# Patient Record
Sex: Female | Born: 1935 | Race: Black or African American | Hispanic: No | Smoking: Never smoker
Health system: Southern US, Community
[De-identification: ages and names within clinical notes are randomized; demographics above are authoritative.]

## PROBLEM LIST (undated history)

## (undated) DIAGNOSIS — I10 Essential (primary) hypertension: Secondary | ICD-10-CM

## (undated) DIAGNOSIS — I509 Heart failure, unspecified: Secondary | ICD-10-CM

## (undated) DIAGNOSIS — E785 Hyperlipidemia, unspecified: Secondary | ICD-10-CM

## (undated) HISTORY — PX: NO PAST SURGERIES: SHX2092

---

## 2001-07-25 ENCOUNTER — Other Ambulatory Visit: Admission: RE | Admit: 2001-07-25 | Discharge: 2001-07-25 | Payer: Self-pay | Admitting: Family Medicine

## 2001-08-07 ENCOUNTER — Ambulatory Visit (HOSPITAL_COMMUNITY): Admission: RE | Admit: 2001-08-07 | Discharge: 2001-08-07 | Payer: Self-pay | Admitting: General Surgery

## 2002-06-23 ENCOUNTER — Encounter: Payer: Self-pay | Admitting: Family Medicine

## 2002-06-23 ENCOUNTER — Ambulatory Visit (HOSPITAL_COMMUNITY): Admission: RE | Admit: 2002-06-23 | Discharge: 2002-06-23 | Payer: Self-pay | Admitting: Family Medicine

## 2003-06-26 ENCOUNTER — Encounter: Payer: Self-pay | Admitting: Family Medicine

## 2003-06-26 ENCOUNTER — Ambulatory Visit (HOSPITAL_COMMUNITY): Admission: RE | Admit: 2003-06-26 | Discharge: 2003-06-26 | Payer: Self-pay | Admitting: Family Medicine

## 2004-08-11 ENCOUNTER — Ambulatory Visit (HOSPITAL_COMMUNITY): Admission: RE | Admit: 2004-08-11 | Discharge: 2004-08-11 | Payer: Self-pay | Admitting: Family Medicine

## 2005-09-18 ENCOUNTER — Ambulatory Visit (HOSPITAL_COMMUNITY): Admission: RE | Admit: 2005-09-18 | Discharge: 2005-09-18 | Payer: Self-pay | Admitting: Family Medicine

## 2005-11-13 ENCOUNTER — Ambulatory Visit: Payer: Self-pay | Admitting: Family Medicine

## 2005-11-14 ENCOUNTER — Ambulatory Visit (HOSPITAL_COMMUNITY): Admission: RE | Admit: 2005-11-14 | Discharge: 2005-11-14 | Payer: Self-pay | Admitting: Family Medicine

## 2005-11-14 ENCOUNTER — Ambulatory Visit: Payer: Self-pay | Admitting: *Deleted

## 2005-12-12 ENCOUNTER — Ambulatory Visit: Payer: Self-pay | Admitting: Family Medicine

## 2006-01-05 ENCOUNTER — Encounter (INDEPENDENT_AMBULATORY_CARE_PROVIDER_SITE_OTHER): Payer: Self-pay | Admitting: Family Medicine

## 2006-01-05 LAB — CONVERTED CEMR LAB: Hgb A1c MFr Bld: 7.6 %

## 2006-03-13 ENCOUNTER — Ambulatory Visit: Payer: Self-pay | Admitting: Family Medicine

## 2006-03-20 ENCOUNTER — Ambulatory Visit (HOSPITAL_COMMUNITY): Admission: RE | Admit: 2006-03-20 | Discharge: 2006-03-20 | Payer: Self-pay | Admitting: Family Medicine

## 2006-03-29 ENCOUNTER — Ambulatory Visit: Payer: Self-pay | Admitting: Family Medicine

## 2006-04-26 ENCOUNTER — Ambulatory Visit: Payer: Self-pay | Admitting: Family Medicine

## 2006-05-10 ENCOUNTER — Ambulatory Visit: Payer: Self-pay | Admitting: Family Medicine

## 2006-06-12 ENCOUNTER — Ambulatory Visit: Payer: Self-pay | Admitting: Family Medicine

## 2006-07-03 ENCOUNTER — Ambulatory Visit: Payer: Self-pay | Admitting: Family Medicine

## 2006-07-03 LAB — CONVERTED CEMR LAB: Hgb A1c MFr Bld: 7.3 %

## 2006-07-05 ENCOUNTER — Encounter (INDEPENDENT_AMBULATORY_CARE_PROVIDER_SITE_OTHER): Payer: Self-pay | Admitting: Family Medicine

## 2006-07-05 LAB — CONVERTED CEMR LAB: Blood Glucose, Fasting: 110 mg/dL

## 2006-07-24 ENCOUNTER — Ambulatory Visit: Payer: Self-pay | Admitting: Family Medicine

## 2006-08-21 ENCOUNTER — Ambulatory Visit: Payer: Self-pay | Admitting: Family Medicine

## 2006-08-28 ENCOUNTER — Ambulatory Visit: Payer: Self-pay | Admitting: Family Medicine

## 2006-09-18 ENCOUNTER — Ambulatory Visit: Payer: Self-pay | Admitting: Family Medicine

## 2006-10-19 ENCOUNTER — Ambulatory Visit (HOSPITAL_COMMUNITY): Admission: RE | Admit: 2006-10-19 | Discharge: 2006-10-19 | Payer: Self-pay | Admitting: Family Medicine

## 2006-10-25 LAB — CONVERTED CEMR LAB: Pap Smear: NORMAL

## 2006-10-31 ENCOUNTER — Encounter (INDEPENDENT_AMBULATORY_CARE_PROVIDER_SITE_OTHER): Payer: Self-pay | Admitting: Family Medicine

## 2006-11-01 ENCOUNTER — Other Ambulatory Visit: Admission: RE | Admit: 2006-11-01 | Discharge: 2006-11-01 | Payer: Self-pay | Admitting: Family Medicine

## 2006-11-01 ENCOUNTER — Ambulatory Visit: Payer: Self-pay | Admitting: Family Medicine

## 2006-11-13 ENCOUNTER — Ambulatory Visit (HOSPITAL_COMMUNITY): Admission: RE | Admit: 2006-11-13 | Discharge: 2006-11-13 | Payer: Self-pay | Admitting: Family Medicine

## 2006-11-13 ENCOUNTER — Encounter (INDEPENDENT_AMBULATORY_CARE_PROVIDER_SITE_OTHER): Payer: Self-pay | Admitting: Family Medicine

## 2006-11-20 ENCOUNTER — Encounter: Payer: Self-pay | Admitting: Family Medicine

## 2006-11-20 DIAGNOSIS — B351 Tinea unguium: Secondary | ICD-10-CM

## 2006-11-20 DIAGNOSIS — E669 Obesity, unspecified: Secondary | ICD-10-CM

## 2006-11-20 DIAGNOSIS — I6529 Occlusion and stenosis of unspecified carotid artery: Secondary | ICD-10-CM

## 2006-11-20 DIAGNOSIS — K5900 Constipation, unspecified: Secondary | ICD-10-CM | POA: Insufficient documentation

## 2006-11-20 DIAGNOSIS — H269 Unspecified cataract: Secondary | ICD-10-CM

## 2006-11-20 DIAGNOSIS — I10 Essential (primary) hypertension: Secondary | ICD-10-CM | POA: Insufficient documentation

## 2006-11-20 DIAGNOSIS — E785 Hyperlipidemia, unspecified: Secondary | ICD-10-CM

## 2006-12-07 ENCOUNTER — Ambulatory Visit: Payer: Self-pay | Admitting: Family Medicine

## 2007-01-04 ENCOUNTER — Encounter (INDEPENDENT_AMBULATORY_CARE_PROVIDER_SITE_OTHER): Payer: Self-pay | Admitting: Family Medicine

## 2007-01-04 LAB — CONVERTED CEMR LAB
BUN: 17 mg/dL (ref 6–23)
Basophils Relative: 0 % (ref 0–1)
CO2: 28 meq/L (ref 19–32)
Calcium: 9.8 mg/dL (ref 8.4–10.5)
Creatinine, Ser: 0.99 mg/dL (ref 0.40–1.20)
Glucose, Bld: 138 mg/dL — ABNORMAL HIGH (ref 70–99)
LDL Cholesterol: 103 mg/dL — ABNORMAL HIGH (ref 0–99)
Monocytes Relative: 9 % (ref 3–11)
Neutro Abs: 3.7 10*3/uL (ref 1.7–7.7)
Total CHOL/HDL Ratio: 2.6
WBC: 5.2 10*3/uL (ref 4.0–10.5)

## 2007-01-18 ENCOUNTER — Ambulatory Visit: Payer: Self-pay | Admitting: Family Medicine

## 2007-01-18 LAB — CONVERTED CEMR LAB
Basophils Absolute: 0 10*3/uL (ref 0.0–0.1)
Eosinophils Absolute: 0.3 10*3/uL (ref 0.0–0.7)
Ferritin: 140 ng/mL (ref 10–291)
HCT: 33.3 % — ABNORMAL LOW (ref 36.0–46.0)
Iron: 38 ug/dL — ABNORMAL LOW (ref 42–145)
Lymphs Abs: 1.1 10*3/uL (ref 0.7–3.3)
Monocytes Relative: 7 % (ref 3–11)
Neutrophils Relative %: 60 % (ref 43–77)
RBC: 4.07 M/uL (ref 3.87–5.11)
RDW: 15.8 % — ABNORMAL HIGH (ref 11.5–14.0)
Retic Count, Absolute: 48.8 (ref 19.0–186.0)
Retic Ct Pct: 1.2 % (ref 0.4–3.1)
Saturation Ratios: 14 % — ABNORMAL LOW (ref 20–55)
TIBC: 267 ug/dL (ref 250–470)
UIBC: 229 ug/dL
WBC: 4.2 10*3/uL (ref 4.0–10.5)

## 2007-02-13 ENCOUNTER — Telehealth (INDEPENDENT_AMBULATORY_CARE_PROVIDER_SITE_OTHER): Payer: Self-pay | Admitting: *Deleted

## 2007-02-26 ENCOUNTER — Ambulatory Visit: Payer: Self-pay | Admitting: Family Medicine

## 2007-02-26 DIAGNOSIS — R9389 Abnormal findings on diagnostic imaging of other specified body structures: Secondary | ICD-10-CM | POA: Insufficient documentation

## 2007-02-27 ENCOUNTER — Ambulatory Visit: Payer: Self-pay | Admitting: Cardiology

## 2007-02-27 ENCOUNTER — Ambulatory Visit (HOSPITAL_COMMUNITY): Admission: RE | Admit: 2007-02-27 | Discharge: 2007-02-27 | Payer: Self-pay | Admitting: Family Medicine

## 2007-02-27 ENCOUNTER — Encounter (INDEPENDENT_AMBULATORY_CARE_PROVIDER_SITE_OTHER): Payer: Self-pay | Admitting: Family Medicine

## 2007-02-27 LAB — CONVERTED CEMR LAB
AST: 16 units/L (ref 0–37)
Albumin: 3.6 g/dL (ref 3.5–5.2)
BUN: 16 mg/dL (ref 6–23)
CO2: 26 meq/L (ref 19–32)
Calcium: 9.1 mg/dL (ref 8.4–10.5)
Cholesterol: 127 mg/dL (ref 0–200)
Eosinophils Absolute: 0.2 10*3/uL (ref 0.0–0.7)
Eosinophils Relative: 5 % (ref 0–5)
Glucose, Bld: 116 mg/dL — ABNORMAL HIGH (ref 70–99)
Hemoglobin: 10 g/dL — ABNORMAL LOW (ref 12.0–15.0)
LDL Cholesterol: 55 mg/dL (ref 0–99)
Lymphs Abs: 1.3 10*3/uL (ref 0.7–3.3)
Monocytes Relative: 8 % (ref 3–11)
Neutro Abs: 2.4 10*3/uL (ref 1.7–7.7)
Neutrophils Relative %: 56 % (ref 43–77)
RBC: 3.88 M/uL (ref 3.87–5.11)
Sodium: 142 meq/L (ref 135–145)
Total CHOL/HDL Ratio: 2.2
Triglycerides: 74 mg/dL (ref <150)
WBC: 4.3 10*3/uL (ref 4.0–10.5)

## 2007-02-28 ENCOUNTER — Encounter (INDEPENDENT_AMBULATORY_CARE_PROVIDER_SITE_OTHER): Payer: Self-pay | Admitting: Family Medicine

## 2007-03-05 ENCOUNTER — Encounter (INDEPENDENT_AMBULATORY_CARE_PROVIDER_SITE_OTHER): Payer: Self-pay | Admitting: Family Medicine

## 2007-03-07 ENCOUNTER — Encounter (INDEPENDENT_AMBULATORY_CARE_PROVIDER_SITE_OTHER): Payer: Self-pay | Admitting: Family Medicine

## 2007-03-11 ENCOUNTER — Encounter (INDEPENDENT_AMBULATORY_CARE_PROVIDER_SITE_OTHER): Payer: Self-pay | Admitting: Family Medicine

## 2007-03-13 ENCOUNTER — Encounter (INDEPENDENT_AMBULATORY_CARE_PROVIDER_SITE_OTHER): Payer: Self-pay | Admitting: Family Medicine

## 2007-03-18 ENCOUNTER — Encounter (INDEPENDENT_AMBULATORY_CARE_PROVIDER_SITE_OTHER): Payer: Self-pay | Admitting: Family Medicine

## 2007-03-21 ENCOUNTER — Telehealth (INDEPENDENT_AMBULATORY_CARE_PROVIDER_SITE_OTHER): Payer: Self-pay | Admitting: Family Medicine

## 2007-04-10 ENCOUNTER — Telehealth (INDEPENDENT_AMBULATORY_CARE_PROVIDER_SITE_OTHER): Payer: Self-pay | Admitting: Family Medicine

## 2007-04-16 ENCOUNTER — Encounter (INDEPENDENT_AMBULATORY_CARE_PROVIDER_SITE_OTHER): Payer: Self-pay | Admitting: Family Medicine

## 2007-04-19 ENCOUNTER — Encounter (INDEPENDENT_AMBULATORY_CARE_PROVIDER_SITE_OTHER): Payer: Self-pay | Admitting: Family Medicine

## 2007-04-23 ENCOUNTER — Ambulatory Visit: Payer: Self-pay | Admitting: Family Medicine

## 2007-04-23 LAB — CONVERTED CEMR LAB
Cholesterol, target level: 200 mg/dL
HDL goal, serum: 40 mg/dL
LDL Goal: 100 mg/dL

## 2007-04-30 ENCOUNTER — Encounter (INDEPENDENT_AMBULATORY_CARE_PROVIDER_SITE_OTHER): Payer: Self-pay | Admitting: Family Medicine

## 2007-05-07 ENCOUNTER — Encounter (INDEPENDENT_AMBULATORY_CARE_PROVIDER_SITE_OTHER): Payer: Self-pay | Admitting: Family Medicine

## 2007-05-21 ENCOUNTER — Ambulatory Visit: Payer: Self-pay | Admitting: Family Medicine

## 2007-05-31 ENCOUNTER — Encounter (INDEPENDENT_AMBULATORY_CARE_PROVIDER_SITE_OTHER): Payer: Self-pay | Admitting: Family Medicine

## 2007-06-04 ENCOUNTER — Encounter (INDEPENDENT_AMBULATORY_CARE_PROVIDER_SITE_OTHER): Payer: Self-pay | Admitting: Family Medicine

## 2007-06-18 ENCOUNTER — Ambulatory Visit: Payer: Self-pay | Admitting: Family Medicine

## 2007-06-18 LAB — CONVERTED CEMR LAB: Glucose, Bld: 114 mg/dL

## 2007-07-10 ENCOUNTER — Encounter (INDEPENDENT_AMBULATORY_CARE_PROVIDER_SITE_OTHER): Payer: Self-pay | Admitting: Family Medicine

## 2007-08-05 ENCOUNTER — Ambulatory Visit: Payer: Self-pay | Admitting: Gastroenterology

## 2007-08-06 ENCOUNTER — Encounter (INDEPENDENT_AMBULATORY_CARE_PROVIDER_SITE_OTHER): Payer: Self-pay | Admitting: Family Medicine

## 2007-08-06 ENCOUNTER — Ambulatory Visit (HOSPITAL_COMMUNITY): Admission: RE | Admit: 2007-08-06 | Discharge: 2007-08-06 | Payer: Self-pay | Admitting: Gastroenterology

## 2007-08-06 ENCOUNTER — Encounter: Payer: Self-pay | Admitting: Gastroenterology

## 2007-08-07 ENCOUNTER — Encounter (INDEPENDENT_AMBULATORY_CARE_PROVIDER_SITE_OTHER): Payer: Self-pay | Admitting: Family Medicine

## 2007-08-12 ENCOUNTER — Ambulatory Visit: Payer: Self-pay | Admitting: Family Medicine

## 2007-08-12 DIAGNOSIS — D126 Benign neoplasm of colon, unspecified: Secondary | ICD-10-CM

## 2007-09-16 ENCOUNTER — Ambulatory Visit: Payer: Self-pay | Admitting: Family Medicine

## 2007-09-16 LAB — CONVERTED CEMR LAB: Hgb A1c MFr Bld: 7.8 %

## 2007-09-30 ENCOUNTER — Encounter (INDEPENDENT_AMBULATORY_CARE_PROVIDER_SITE_OTHER): Payer: Self-pay | Admitting: Family Medicine

## 2007-11-27 ENCOUNTER — Encounter (INDEPENDENT_AMBULATORY_CARE_PROVIDER_SITE_OTHER): Payer: Self-pay | Admitting: Family Medicine

## 2007-12-05 ENCOUNTER — Ambulatory Visit (HOSPITAL_COMMUNITY): Admission: RE | Admit: 2007-12-05 | Discharge: 2007-12-05 | Payer: Self-pay | Admitting: Family Medicine

## 2007-12-06 ENCOUNTER — Encounter (INDEPENDENT_AMBULATORY_CARE_PROVIDER_SITE_OTHER): Payer: Self-pay | Admitting: Family Medicine

## 2007-12-06 ENCOUNTER — Telehealth (INDEPENDENT_AMBULATORY_CARE_PROVIDER_SITE_OTHER): Payer: Self-pay | Admitting: *Deleted

## 2007-12-27 ENCOUNTER — Encounter (INDEPENDENT_AMBULATORY_CARE_PROVIDER_SITE_OTHER): Payer: Self-pay | Admitting: Family Medicine

## 2008-01-24 ENCOUNTER — Ambulatory Visit: Payer: Self-pay | Admitting: Family Medicine

## 2008-01-24 LAB — CONVERTED CEMR LAB: Glucose, Bld: 132 mg/dL

## 2008-01-25 ENCOUNTER — Encounter (INDEPENDENT_AMBULATORY_CARE_PROVIDER_SITE_OTHER): Payer: Self-pay | Admitting: Family Medicine

## 2008-01-25 LAB — CONVERTED CEMR LAB
ALT: 13 units/L (ref 0–35)
AST: 20 units/L (ref 0–37)
Basophils Relative: 1 % (ref 0–1)
Calcium: 10.2 mg/dL (ref 8.4–10.5)
Creatinine, Ser: 1.35 mg/dL — ABNORMAL HIGH (ref 0.40–1.20)
Eosinophils Absolute: 0.2 10*3/uL (ref 0.0–0.7)
Glucose, Bld: 116 mg/dL — ABNORMAL HIGH (ref 70–99)
MCHC: 32.3 g/dL (ref 30.0–36.0)
MCV: 81.6 fL (ref 78.0–100.0)
Neutro Abs: 2.2 10*3/uL (ref 1.7–7.7)
Platelets: 141 10*3/uL — ABNORMAL LOW (ref 150–400)
RBC: 4.18 M/uL (ref 3.87–5.11)
Total Bilirubin: 0.4 mg/dL (ref 0.3–1.2)
WBC: 4.2 10*3/uL (ref 4.0–10.5)

## 2008-01-27 ENCOUNTER — Telehealth (INDEPENDENT_AMBULATORY_CARE_PROVIDER_SITE_OTHER): Payer: Self-pay | Admitting: *Deleted

## 2008-01-29 ENCOUNTER — Encounter (INDEPENDENT_AMBULATORY_CARE_PROVIDER_SITE_OTHER): Payer: Self-pay | Admitting: Family Medicine

## 2008-01-30 ENCOUNTER — Encounter (INDEPENDENT_AMBULATORY_CARE_PROVIDER_SITE_OTHER): Payer: Self-pay | Admitting: Family Medicine

## 2008-02-25 ENCOUNTER — Encounter (INDEPENDENT_AMBULATORY_CARE_PROVIDER_SITE_OTHER): Payer: Self-pay | Admitting: Family Medicine

## 2008-04-22 ENCOUNTER — Ambulatory Visit: Payer: Self-pay | Admitting: Family Medicine

## 2008-04-22 ENCOUNTER — Telehealth (INDEPENDENT_AMBULATORY_CARE_PROVIDER_SITE_OTHER): Payer: Self-pay | Admitting: *Deleted

## 2008-04-22 LAB — CONVERTED CEMR LAB
Glucose, Urine, Semiquant: NEGATIVE
Nitrite: NEGATIVE
Protein, U semiquant: NEGATIVE
Specific Gravity, Urine: 1.01
pH: 5

## 2008-04-24 ENCOUNTER — Encounter (INDEPENDENT_AMBULATORY_CARE_PROVIDER_SITE_OTHER): Payer: Self-pay | Admitting: Family Medicine

## 2008-04-27 ENCOUNTER — Encounter (INDEPENDENT_AMBULATORY_CARE_PROVIDER_SITE_OTHER): Payer: Self-pay | Admitting: Family Medicine

## 2008-04-27 ENCOUNTER — Telehealth (INDEPENDENT_AMBULATORY_CARE_PROVIDER_SITE_OTHER): Payer: Self-pay | Admitting: *Deleted

## 2008-04-27 LAB — CONVERTED CEMR LAB
ALT: 11 units/L (ref 0–35)
Albumin: 4.2 g/dL (ref 3.5–5.2)
Alkaline Phosphatase: 44 units/L (ref 39–117)
Basophils Absolute: 0 10*3/uL (ref 0.0–0.1)
Basophils Relative: 1 % (ref 0–1)
CO2: 24 meq/L (ref 19–32)
Chloride: 104 meq/L (ref 96–112)
Cholesterol: 180 mg/dL (ref 0–200)
Creatinine, Urine: 63.8 mg/dL
HDL: 71 mg/dL (ref >39)
LDL Cholesterol: 94 mg/dL (ref 0–99)
Lymphs Abs: 1.2 10*3/uL (ref 0.7–4.0)
MCHC: 32.8 g/dL (ref 30.0–36.0)
MCV: 81.8 fL (ref 78.0–100.0)
Microalb Creat Ratio: 13.3 mg/g (ref 0.0–30.0)
Microalb, Ur: 0.85 mg/dL (ref 0.00–1.89)
Neutro Abs: 1.6 10*3/uL — ABNORMAL LOW (ref 1.7–7.7)
RBC: 3.73 M/uL — ABNORMAL LOW (ref 3.87–5.11)
RDW: 16 % — ABNORMAL HIGH (ref 11.5–15.5)
Sodium: 141 meq/L (ref 135–145)
Total Bilirubin: 0.4 mg/dL (ref 0.3–1.2)
WBC: 3.3 10*3/uL — ABNORMAL LOW (ref 4.0–10.5)

## 2008-04-28 ENCOUNTER — Ambulatory Visit (HOSPITAL_COMMUNITY): Admission: RE | Admit: 2008-04-28 | Discharge: 2008-04-28 | Payer: Self-pay | Admitting: Family Medicine

## 2008-04-30 ENCOUNTER — Encounter (HOSPITAL_COMMUNITY): Admission: RE | Admit: 2008-04-30 | Discharge: 2008-05-30 | Payer: Self-pay | Admitting: Family Medicine

## 2008-04-30 ENCOUNTER — Telehealth (INDEPENDENT_AMBULATORY_CARE_PROVIDER_SITE_OTHER): Payer: Self-pay | Admitting: *Deleted

## 2008-04-30 ENCOUNTER — Encounter (INDEPENDENT_AMBULATORY_CARE_PROVIDER_SITE_OTHER): Payer: Self-pay | Admitting: Family Medicine

## 2008-05-12 ENCOUNTER — Encounter (INDEPENDENT_AMBULATORY_CARE_PROVIDER_SITE_OTHER): Payer: Self-pay | Admitting: Family Medicine

## 2008-06-02 ENCOUNTER — Encounter (INDEPENDENT_AMBULATORY_CARE_PROVIDER_SITE_OTHER): Payer: Self-pay | Admitting: Family Medicine

## 2008-06-02 ENCOUNTER — Encounter (HOSPITAL_COMMUNITY): Admission: RE | Admit: 2008-06-02 | Discharge: 2008-07-02 | Payer: Self-pay | Admitting: Oncology

## 2008-06-02 ENCOUNTER — Ambulatory Visit (HOSPITAL_COMMUNITY): Payer: Self-pay | Admitting: Oncology

## 2008-06-03 ENCOUNTER — Ambulatory Visit: Payer: Self-pay | Admitting: Family Medicine

## 2008-06-03 DIAGNOSIS — D61818 Other pancytopenia: Secondary | ICD-10-CM | POA: Insufficient documentation

## 2008-06-15 ENCOUNTER — Encounter (INDEPENDENT_AMBULATORY_CARE_PROVIDER_SITE_OTHER): Payer: Self-pay | Admitting: Family Medicine

## 2008-07-15 ENCOUNTER — Ambulatory Visit: Payer: Self-pay | Admitting: Family Medicine

## 2008-07-20 ENCOUNTER — Ambulatory Visit (HOSPITAL_COMMUNITY): Payer: Self-pay | Admitting: Oncology

## 2008-07-20 ENCOUNTER — Encounter (HOSPITAL_COMMUNITY): Admission: RE | Admit: 2008-07-20 | Discharge: 2008-08-19 | Payer: Self-pay | Admitting: Oncology

## 2008-09-21 ENCOUNTER — Encounter (HOSPITAL_COMMUNITY): Admission: RE | Admit: 2008-09-21 | Discharge: 2008-10-21 | Payer: Self-pay | Admitting: Oncology

## 2008-09-21 ENCOUNTER — Ambulatory Visit (HOSPITAL_COMMUNITY): Payer: Self-pay | Admitting: Oncology

## 2008-10-15 ENCOUNTER — Ambulatory Visit: Payer: Self-pay | Admitting: Family Medicine

## 2008-10-15 LAB — CONVERTED CEMR LAB: Hgb A1c MFr Bld: 7 %

## 2008-10-17 ENCOUNTER — Encounter (INDEPENDENT_AMBULATORY_CARE_PROVIDER_SITE_OTHER): Payer: Self-pay | Admitting: Family Medicine

## 2008-10-19 LAB — CONVERTED CEMR LAB
ALT: 11 units/L (ref 0–35)
AST: 17 units/L (ref 0–37)
Alkaline Phosphatase: 45 units/L (ref 39–117)
BUN: 39 mg/dL — ABNORMAL HIGH (ref 6–23)
CO2: 20 meq/L (ref 19–32)
Creatinine, Ser: 1.53 mg/dL — ABNORMAL HIGH (ref 0.40–1.20)
Glucose, Bld: 164 mg/dL — ABNORMAL HIGH (ref 70–99)
Potassium: 3.9 meq/L (ref 3.5–5.3)
Total Protein: 7 g/dL (ref 6.0–8.3)

## 2008-12-15 ENCOUNTER — Ambulatory Visit (HOSPITAL_COMMUNITY): Payer: Self-pay | Admitting: Oncology

## 2008-12-15 ENCOUNTER — Encounter (HOSPITAL_COMMUNITY): Admission: RE | Admit: 2008-12-15 | Discharge: 2009-01-14 | Payer: Self-pay | Admitting: Oncology

## 2009-01-15 ENCOUNTER — Ambulatory Visit: Payer: Self-pay | Admitting: Family Medicine

## 2009-01-15 DIAGNOSIS — E1165 Type 2 diabetes mellitus with hyperglycemia: Secondary | ICD-10-CM

## 2009-01-15 LAB — CONVERTED CEMR LAB: Glucose, Bld: 212 mg/dL

## 2009-01-16 ENCOUNTER — Encounter (INDEPENDENT_AMBULATORY_CARE_PROVIDER_SITE_OTHER): Payer: Self-pay | Admitting: Family Medicine

## 2009-01-19 LAB — CONVERTED CEMR LAB
ALT: 21 units/L (ref 0–35)
AST: 21 units/L (ref 0–37)
Albumin: 3.5 g/dL (ref 3.5–5.2)
Alkaline Phosphatase: 47 units/L (ref 39–117)
BUN: 25 mg/dL — ABNORMAL HIGH (ref 6–23)
Basophils Absolute: 0 10*3/uL (ref 0.0–0.1)
Basophils Relative: 1 % (ref 0–1)
Chloride: 108 meq/L (ref 96–112)
Eosinophils Absolute: 0.2 10*3/uL (ref 0.0–0.7)
MCHC: 31.5 g/dL (ref 30.0–36.0)
MCV: 81.6 fL (ref 78.0–100.0)
Monocytes Relative: 7 % (ref 3–12)
Neutro Abs: 2.6 10*3/uL (ref 1.7–7.7)
Neutrophils Relative %: 65 % (ref 43–77)
Platelets: 150 10*3/uL (ref 150–400)
Potassium: 3.9 meq/L (ref 3.5–5.3)
RDW: 16.6 % — ABNORMAL HIGH (ref 11.5–15.5)
Sodium: 142 meq/L (ref 135–145)

## 2009-01-27 ENCOUNTER — Encounter (HOSPITAL_COMMUNITY): Admission: RE | Admit: 2009-01-27 | Discharge: 2009-02-26 | Payer: Self-pay | Admitting: Oncology

## 2009-02-15 ENCOUNTER — Ambulatory Visit (HOSPITAL_COMMUNITY): Payer: Self-pay | Admitting: Oncology

## 2009-02-26 ENCOUNTER — Ambulatory Visit: Payer: Self-pay | Admitting: Family Medicine

## 2009-04-09 ENCOUNTER — Ambulatory Visit: Payer: Self-pay | Admitting: Family Medicine

## 2009-04-09 LAB — CONVERTED CEMR LAB

## 2009-05-21 ENCOUNTER — Other Ambulatory Visit: Admission: RE | Admit: 2009-05-21 | Discharge: 2009-05-21 | Payer: Self-pay | Admitting: Family Medicine

## 2009-05-21 ENCOUNTER — Encounter (INDEPENDENT_AMBULATORY_CARE_PROVIDER_SITE_OTHER): Payer: Self-pay | Admitting: Family Medicine

## 2009-05-21 ENCOUNTER — Ambulatory Visit: Payer: Self-pay | Admitting: Family Medicine

## 2009-05-21 DIAGNOSIS — M949 Disorder of cartilage, unspecified: Secondary | ICD-10-CM

## 2009-05-21 DIAGNOSIS — M899 Disorder of bone, unspecified: Secondary | ICD-10-CM | POA: Insufficient documentation

## 2009-05-25 LAB — CONVERTED CEMR LAB
Albumin: 4.2 g/dL (ref 3.5–5.2)
BUN: 30 mg/dL — ABNORMAL HIGH (ref 6–23)
Basophils Absolute: 0 10*3/uL (ref 0.0–0.1)
Calcium: 9.5 mg/dL (ref 8.4–10.5)
Chloride: 105 meq/L (ref 96–112)
Creatinine, Ser: 1.25 mg/dL — ABNORMAL HIGH (ref 0.40–1.20)
Eosinophils Absolute: 0.3 10*3/uL (ref 0.0–0.7)
Eosinophils Relative: 7 % — ABNORMAL HIGH (ref 0–5)
Glucose, Bld: 118 mg/dL — ABNORMAL HIGH (ref 70–99)
HCT: 32.8 % — ABNORMAL LOW (ref 36.0–46.0)
HDL: 78 mg/dL (ref >39)
Hemoglobin: 10.9 g/dL — ABNORMAL LOW (ref 12.0–15.0)
Lymphocytes Relative: 30 % (ref 12–46)
Lymphs Abs: 1.1 10*3/uL (ref 0.7–4.0)
MCV: 79.6 fL (ref 78.0–100.0)
Monocytes Absolute: 0.4 10*3/uL (ref 0.1–1.0)
Potassium: 3.8 meq/L (ref 3.5–5.3)
RDW: 16.8 % — ABNORMAL HIGH (ref 11.5–15.5)
Triglycerides: 60 mg/dL (ref <150)

## 2009-06-11 ENCOUNTER — Ambulatory Visit (HOSPITAL_COMMUNITY): Admission: RE | Admit: 2009-06-11 | Discharge: 2009-06-11 | Payer: Self-pay | Admitting: Family Medicine

## 2009-06-11 ENCOUNTER — Encounter (INDEPENDENT_AMBULATORY_CARE_PROVIDER_SITE_OTHER): Payer: Self-pay | Admitting: Family Medicine

## 2009-06-25 ENCOUNTER — Ambulatory Visit: Payer: Self-pay | Admitting: Family Medicine

## 2009-06-26 ENCOUNTER — Encounter (INDEPENDENT_AMBULATORY_CARE_PROVIDER_SITE_OTHER): Payer: Self-pay | Admitting: Family Medicine

## 2009-06-29 LAB — CONVERTED CEMR LAB: Microalb Creat Ratio: 15.3 mg/g (ref 0.0–30.0)

## 2009-07-02 ENCOUNTER — Encounter: Admission: RE | Admit: 2009-07-02 | Discharge: 2009-07-02 | Payer: Self-pay | Admitting: Family Medicine

## 2009-08-06 ENCOUNTER — Ambulatory Visit: Payer: Self-pay | Admitting: Family Medicine

## 2009-08-06 DIAGNOSIS — E041 Nontoxic single thyroid nodule: Secondary | ICD-10-CM

## 2009-08-06 LAB — CONVERTED CEMR LAB
Glucose, Bld: 121 mg/dL
Hgb A1c MFr Bld: 7.4 %

## 2009-08-17 ENCOUNTER — Ambulatory Visit (HOSPITAL_COMMUNITY): Admission: RE | Admit: 2009-08-17 | Discharge: 2009-08-17 | Payer: Self-pay | Admitting: Family Medicine

## 2009-08-31 ENCOUNTER — Encounter (INDEPENDENT_AMBULATORY_CARE_PROVIDER_SITE_OTHER): Payer: Self-pay | Admitting: Family Medicine

## 2009-10-03 ENCOUNTER — Inpatient Hospital Stay (HOSPITAL_COMMUNITY): Admission: EM | Admit: 2009-10-03 | Discharge: 2009-10-05 | Payer: Self-pay | Admitting: Emergency Medicine

## 2009-10-03 ENCOUNTER — Emergency Department (HOSPITAL_COMMUNITY): Admission: EM | Admit: 2009-10-03 | Discharge: 2009-10-03 | Payer: Self-pay | Admitting: Emergency Medicine

## 2010-05-05 ENCOUNTER — Emergency Department (HOSPITAL_COMMUNITY): Admission: EM | Admit: 2010-05-05 | Discharge: 2010-05-05 | Payer: Self-pay | Admitting: Family Medicine

## 2011-01-15 ENCOUNTER — Encounter: Payer: Self-pay | Admitting: Family Medicine

## 2011-01-16 ENCOUNTER — Encounter: Payer: Self-pay | Admitting: Family Medicine

## 2011-03-30 LAB — BASIC METABOLIC PANEL
BUN: 29 mg/dL — ABNORMAL HIGH (ref 6–23)
BUN: 31 mg/dL — ABNORMAL HIGH (ref 6–23)
CO2: 26 mEq/L (ref 19–32)
Chloride: 95 mEq/L — ABNORMAL LOW (ref 96–112)
Chloride: 96 mEq/L (ref 96–112)
Creatinine, Ser: 1.42 mg/dL — ABNORMAL HIGH (ref 0.4–1.2)
GFR calc Af Amer: 43 mL/min — ABNORMAL LOW (ref >60)
GFR calc non Af Amer: 30 mL/min — ABNORMAL LOW (ref >60)
Glucose, Bld: 68 mg/dL — ABNORMAL LOW (ref 70–99)
Potassium: 2.7 mEq/L — CL (ref 3.5–5.1)
Potassium: 3.5 mEq/L (ref 3.5–5.1)
Potassium: 3.5 mEq/L (ref 3.5–5.1)
Sodium: 129 mEq/L — ABNORMAL LOW (ref 135–145)
Sodium: 133 mEq/L — ABNORMAL LOW (ref 135–145)

## 2011-03-30 LAB — GLUCOSE, CAPILLARY
Glucose-Capillary: 113 mg/dL — ABNORMAL HIGH (ref 70–99)
Glucose-Capillary: 229 mg/dL — ABNORMAL HIGH (ref 70–99)
Glucose-Capillary: 59 mg/dL — ABNORMAL LOW (ref 70–99)

## 2011-03-30 LAB — BRAIN NATRIURETIC PEPTIDE: Pro B Natriuretic peptide (BNP): 121 pg/mL — ABNORMAL HIGH (ref 0.0–100.0)

## 2011-03-30 LAB — COMPREHENSIVE METABOLIC PANEL
AST: 98 U/L — ABNORMAL HIGH (ref 0–37)
Albumin: 3.4 g/dL — ABNORMAL LOW (ref 3.5–5.2)
CO2: 24 mEq/L (ref 19–32)
Calcium: 8.7 mg/dL (ref 8.4–10.5)
Creatinine, Ser: 1.96 mg/dL — ABNORMAL HIGH (ref 0.4–1.2)
GFR calc Af Amer: 30 mL/min — ABNORMAL LOW (ref >60)
GFR calc non Af Amer: 25 mL/min — ABNORMAL LOW (ref >60)
Total Protein: 7.6 g/dL (ref 6.0–8.3)

## 2011-03-30 LAB — POCT I-STAT, CHEM 8
BUN: 38 mg/dL — ABNORMAL HIGH (ref 6–23)
Calcium, Ion: 1.06 mmol/L — ABNORMAL LOW (ref 1.12–1.32)
Chloride: 99 mEq/L (ref 96–112)
Glucose, Bld: 76 mg/dL (ref 70–99)
HCT: 36 % (ref 36.0–46.0)

## 2011-03-30 LAB — CBC
HCT: 28.9 % — ABNORMAL LOW (ref 36.0–46.0)
HCT: 32.7 % — ABNORMAL LOW (ref 36.0–46.0)
Hemoglobin: 10.8 g/dL — ABNORMAL LOW (ref 12.0–15.0)
MCHC: 32.9 g/dL (ref 30.0–36.0)
MCV: 83.9 fL (ref 78.0–100.0)
MCV: 84.4 fL (ref 78.0–100.0)
Platelets: 93 10*3/uL — ABNORMAL LOW (ref 150–400)
RBC: 3.88 MIL/uL (ref 3.87–5.11)
RDW: 15 % (ref 11.5–15.5)

## 2011-03-30 LAB — DIFFERENTIAL
Basophils Absolute: 0 10*3/uL (ref 0.0–0.1)
Eosinophils Absolute: 0 10*3/uL (ref 0.0–0.7)
Eosinophils Relative: 0 % (ref 0–5)

## 2011-03-30 LAB — HEMOGLOBIN A1C: Hgb A1c MFr Bld: 7.5 % — ABNORMAL HIGH (ref 4.6–6.1)

## 2011-04-11 LAB — DIFFERENTIAL
Lymphocytes Relative: 28 % (ref 12–46)
Monocytes Absolute: 0.3 10*3/uL (ref 0.1–1.0)
Monocytes Relative: 7 % (ref 3–12)
Neutro Abs: 2.1 10*3/uL (ref 1.7–7.7)
Neutrophils Relative %: 58 % (ref 43–77)

## 2011-04-11 LAB — CBC
Hemoglobin: 10.9 g/dL — ABNORMAL LOW (ref 12.0–15.0)
RBC: 3.9 MIL/uL (ref 3.87–5.11)
WBC: 3.6 10*3/uL — ABNORMAL LOW (ref 4.0–10.5)

## 2011-05-09 NOTE — Op Note (Signed)
Rachel Copeland, Rachel Copeland           ACCOUNT NO.:  [id number]   MEDICAL RECORD NO.:  [id number]          PATIENT TYPE:  AMB   LOCATION:  DAY                           FACILITY:  [facility]   PHYSICIAN:  Kassie Mends, M.D.      DATE OF BIRTH:  [birth year]   DATE OF PROCEDURE:  08/06/2007  DATE OF DISCHARGE:                               OPERATIVE REPORT   REFERRING PHYSICIAN:  Franchot Heidelberg, M.D.   PROCEDURE:  Colonoscopy with cold forceps polypectomy.   INDICATION FOR EXAM:  Ms. Smolinski is a 77 year old female who  presents for average-risk colon cancer screening.   FINDINGS:  1. A 4-mm transverse colon polyp remains removed via cold forceps.  2. A 4-mm rectal polyp removed via cold forceps.  3. Otherwise no masses, inflammatory changes, diverticula or AVMs      seen.  4. Normal retroflexed view of the rectum.   RECOMMENDATIONS:  1..  She should follow a high fiber diet.  She was  given a handout on high-fiber diet and polyps.  1. We will call her with the results of her biopsies.  If her polyp is      adenomatous, then she should have a screening colonoscopy in 5      years,  if her health remains good.  If her polyp is adenomatous,      then her children should have a colonoscopy at age 75 and then      every 10 years unless they are diagnosed with an adenomatous polyp.  2. No aspirin, NSAIDs or anticoagulation for 5 days.   MEDICATIONS:  1. Demerol 75 mg IV.  2. Versed 3 mg IV.   PROCEDURE TECHNIQUE:  Physical exam was performed and informed consent  was obtained from the patient after explaining the benefits, risks and  alternatives to the procedure.  The patient was connected to the monitor  and placed in the left lateral position.  Continuous oxygen was provided  by nasal cannula and IV medicine administered through an indwelling  cannula.  After administration of sedation and rectal exam, the  patient's rectum was intubated  and the scope was advanced under direct  visualization to the cecum.  The  scope was removed slowly by carefully examining the color, texture,  anatomy, and integrity of the mucosa on the way out.  The patient was  recovered in endoscopy and discharged home in satisfactory condition.      Kassie Mends, M.D.  Electronically Signed     SM/MEDQ  D:  08/06/2007  T:  08/07/2007  Job:  [id number]   cc:   Franchot Heidelberg, M.D.

## 2011-05-12 NOTE — Procedures (Signed)
NAMEDUDLEY, COOLEY           ACCOUNT NO.:  [id number]   MEDICAL RECORD NO.:  [id number]          PATIENT TYPE:  OUT   LOCATION:  RAD                           FACILITY:  [facility]   PHYSICIAN:  Gerrit Friends. Dietrich Pates, MD, FACCDATE OF BIRTH:  02/06/1929   DATE OF PROCEDURE:  DATE OF DISCHARGE:                                ECHOCARDIOGRAM   CLINICAL DATA:  A 77 year old woman with EKG abnormalities and aortic  insufficiency.  M-mode aorta 3.8, left atrium 4.5, septum 2.3, posterior  wall 1.5, LV diastole 3.8, LV systole 2.8.   1. Technically adequate echocardiographic study.  2. Mild to moderate left atrial enlargement; normal right atrial size.  3. Normal right ventricular size and function; mild RVH.  4. Normal dimensions of the proximal ascending aorta; mild to moderate      calcification of the wall and most notably the aortic annulus.  5. Aortic valve is a trileaflet structure with mild sclerosis and      trace insufficiency.  6. Normal pulmonic valve and proximal pulmonary artery.  7. Normal tricuspid valve.  8. Mild mitral valve calcification and thickening; mild to moderate      annular calcification; very mild regurgitation.  9. Normal left ventricular size; moderate to marked hypertrophy with      disproportionate septal thickening; normal regional and global LV      systolic function.  10.Normal IVC.  11.Comparison to prior study of November 14, 2005:  No significant      interval change.   IMPRESSION:  Mild to moderate left atrial enlargement; substantial left  ventricular hypertrophy in asymmetric pattern without evidence for left  ventricular outflow tract obstruction.  If the patient has a history of  hypertension, this could represent hypertensive heart disease.  If not,  the possibility of a primary hypertrophic cardiomyopathy should be  considered.   Per      Gerrit Friends. Dietrich Pates, MD, [facility]  Electronically Signed     RMR/MEDQ  D:  02/27/2007  T:  02/28/2007   Job:  [id number]

## 2011-06-20 ENCOUNTER — Other Ambulatory Visit: Payer: Self-pay | Admitting: Family Medicine

## 2011-06-20 DIAGNOSIS — Z1231 Encounter for screening mammogram for malignant neoplasm of breast: Secondary | ICD-10-CM

## 2011-06-27 ENCOUNTER — Ambulatory Visit
Admission: RE | Admit: 2011-06-27 | Discharge: 2011-06-27 | Disposition: A | Discharge status: Home / Self Care | Payer: Medicare Other | Source: Ambulatory Visit | Attending: Family Medicine | Admitting: Family Medicine

## 2011-06-27 DIAGNOSIS — Z1231 Encounter for screening mammogram for malignant neoplasm of breast: Secondary | ICD-10-CM

## 2011-09-21 LAB — DIFFERENTIAL
Basophils Absolute: 0
Eosinophils Absolute: 0.2
Eosinophils Relative: 5
Lymphocytes Relative: 29
Lymphs Abs: 1.1
Monocytes Absolute: 0.4

## 2011-09-21 LAB — CBC
HCT: 28.3 — ABNORMAL LOW
Hemoglobin: 9.7 — ABNORMAL LOW
MCV: 81.6
RDW: 16 — ABNORMAL HIGH

## 2011-09-21 LAB — IRON AND TIBC
Saturation Ratios: 17 — ABNORMAL LOW
TIBC: 274

## 2011-09-21 LAB — RHEUMATOID FACTOR: Rhuematoid fact SerPl-aCnc: <20

## 2011-09-22 LAB — CBC
HCT: 31.1 — ABNORMAL LOW
MCV: 83.8
Platelets: 125 — ABNORMAL LOW
RDW: 15.9 — ABNORMAL HIGH

## 2011-09-22 LAB — DIFFERENTIAL
Basophils Absolute: 0
Lymphs Abs: 1.2
Monocytes Absolute: 0.3
Neutrophils Relative %: 56

## 2011-09-25 LAB — DIFFERENTIAL
Basophils Absolute: 0
Lymphocytes Relative: 25
Neutro Abs: 2.8
Neutrophils Relative %: 61

## 2011-09-25 LAB — CBC
Platelets: 142 — ABNORMAL LOW
RDW: 15.7 — ABNORMAL HIGH

## 2011-09-29 LAB — CBC
HCT: 32.3 % — ABNORMAL LOW (ref 36.0–46.0)
Hemoglobin: 10.6 g/dL — ABNORMAL LOW (ref 12.0–15.0)
Platelets: 118 10*3/uL — ABNORMAL LOW (ref 150–400)
RBC: 3.91 MIL/uL (ref 3.87–5.11)
WBC: 3.3 10*3/uL — ABNORMAL LOW (ref 4.0–10.5)

## 2011-09-29 LAB — DIFFERENTIAL
Basophils Relative: 1 % (ref 0–1)
Lymphocytes Relative: 30 % (ref 12–46)
Monocytes Relative: 8 % (ref 3–12)
Neutro Abs: 1.8 10*3/uL (ref 1.7–7.7)

## 2012-02-21 ENCOUNTER — Emergency Department (HOSPITAL_COMMUNITY): Payer: PRIVATE HEALTH INSURANCE

## 2012-02-21 ENCOUNTER — Emergency Department (HOSPITAL_COMMUNITY)
Admission: EM | Admit: 2012-02-21 | Discharge: 2012-02-22 | Disposition: A | Discharge status: Home / Self Care | Payer: PRIVATE HEALTH INSURANCE | Attending: Emergency Medicine | Admitting: Emergency Medicine

## 2012-02-21 ENCOUNTER — Encounter (HOSPITAL_COMMUNITY): Payer: Self-pay | Admitting: *Deleted

## 2012-02-21 DIAGNOSIS — M79609 Pain in unspecified limb: Secondary | ICD-10-CM | POA: Insufficient documentation

## 2012-02-21 DIAGNOSIS — I10 Essential (primary) hypertension: Secondary | ICD-10-CM | POA: Insufficient documentation

## 2012-02-21 DIAGNOSIS — R252 Cramp and spasm: Secondary | ICD-10-CM | POA: Insufficient documentation

## 2012-02-21 DIAGNOSIS — M545 Low back pain, unspecified: Secondary | ICD-10-CM | POA: Insufficient documentation

## 2012-02-21 DIAGNOSIS — E119 Type 2 diabetes mellitus without complications: Secondary | ICD-10-CM | POA: Insufficient documentation

## 2012-02-21 HISTORY — DX: Essential (primary) hypertension: I10

## 2012-02-21 LAB — DIFFERENTIAL
Basophils Absolute: 0 10*3/uL (ref 0.0–0.1)
Eosinophils Absolute: 0.2 10*3/uL (ref 0.0–0.7)
Eosinophils Relative: 6 % — ABNORMAL HIGH (ref 0–5)
Lymphocytes Relative: 32 % (ref 12–46)
Lymphs Abs: 1.3 10*3/uL (ref 0.7–4.0)
Neutrophils Relative %: 52 % (ref 43–77)

## 2012-02-21 LAB — CBC
MCH: 26.8 pg (ref 26.0–34.0)
MCV: 79.8 fL (ref 78.0–100.0)
Platelets: 129 10*3/uL — ABNORMAL LOW (ref 150–400)
RBC: 4.55 MIL/uL (ref 3.87–5.11)
RDW: 16.4 % — ABNORMAL HIGH (ref 11.5–15.5)
WBC: 4 10*3/uL (ref 4.0–10.5)

## 2012-02-21 NOTE — ED Notes (Signed)
Pt not in room, in xray.  

## 2012-02-21 NOTE — ED Notes (Signed)
Reports pain to bilateral upper legs and lower back for several days, denies injury to back. Ambulatory at triage.

## 2012-02-21 NOTE — ED Provider Notes (Signed)
History     CSN: [id number]  Arrival date & time 02/21/12  6578   First MD Initiated Contact with Patient 02/21/12 2155      Chief Complaint  Patient presents with  . Leg Pain  . Back Pain    (Consider location/radiation/quality/duration/timing/severity/associated sxs/prior treatment) HPI Comments: Patient states that for the last couple days when she gets up to the right.  She has pain that starts in her low back and radiates to the anterior sections of her thighs and feels more crampy in nature.  This does not diminish, after she's been ambulating for while.  She has not taken any medication for this other than an aspirin once a day  Patient is a 78 y.o. female presenting with leg pain and back pain.  Leg Pain  The incident occurred more than 2 days ago. The incident occurred at home. There was no injury mechanism. The pain is present in the left leg and right leg. The pain is at a severity of 5/10. The pain is moderate. The pain has been intermittent since onset. Pertinent negatives include no numbness, no inability to bear weight, no loss of motion, no muscle weakness, no loss of sensation and no tingling.  Back Pain  Associated symptoms include leg pain. Pertinent negatives include no fever, no numbness, no dysuria and no tingling.    Past Medical History  Diagnosis Date  . Diabetes mellitus   . Hypertension     History reviewed. No pertinent past surgical history.  History reviewed. No pertinent family history.  History  Substance Use Topics  . Smoking status: Not on file  . Smokeless tobacco: Not on file  . Alcohol Use: No    OB History    Grav Para Term Preterm Abortions TAB SAB Ect Mult Living                  Review of Systems  Constitutional: Negative for fever.  Respiratory: Negative for shortness of breath.   Cardiovascular: Positive for leg swelling.  Gastrointestinal: Negative for nausea.  Genitourinary: Negative for dysuria and frequency.    Musculoskeletal: Positive for back pain.  Neurological: Negative for dizziness, tingling and numbness.    Allergies  Sitagliptin phosphate  Home Medications   Current Outpatient Rx  Name Route Sig Dispense Refill  . AMLODIPINE-VALSARTAN-HCTZ 10-320-25 MG PO TABS Oral Take 1 tablet by mouth daily.    Marland Kitchen VITAMIN C 1000 MG PO TABS Oral Take 1,000 mg by mouth every morning.    Marland Kitchen CALCIUM CARBONATE-VITAMIN D 500-200 MG-UNIT PO TABS Oral Take 1 tablet by mouth every morning.    Marland Kitchen CLONIDINE HCL 0.2 MG PO TABS Oral Take 0.2 mg by mouth 2 (two) times daily.    Marland Kitchen EZETIMIBE-SIMVASTATIN 10-20 MG PO TABS Oral Take 1 tablet by mouth at bedtime.    . OMEGA-3 FATTY ACIDS 1000 MG PO CAPS Oral Take 1 g by mouth every morning.    . FUROSEMIDE 20 MG PO TABS Oral Take 20 mg by mouth every morning.    Marland Kitchen GLIMEPIRIDE 2 MG PO TABS Oral Take 2 mg by mouth daily before breakfast.    . ADULT MULTIVITAMIN W/MINERALS CH Oral Take 1 tablet by mouth daily.    Marland Kitchen PIOGLITAZONE HCL 45 MG PO TABS Oral Take 45 mg by mouth every morning.    Marland Kitchen HYDROCODONE-ACETAMINOPHEN 7.5-500 MG/15ML PO SOLN Oral Take 15 mLs by mouth every 6 (six) hours as needed for pain. 120 mL 0  BP 162/80  Pulse 67  Temp(Src) 97 F (36.1 C) (Oral)  Resp 16  SpO2 100%  Physical Exam  Constitutional: She is oriented to person, place, and time. She appears well-developed and well-nourished.  HENT:  Head: Normocephalic.  Neck: Normal range of motion.  Cardiovascular: Normal rate.   Pulmonary/Chest: Effort normal.  Abdominal: She exhibits no distension.  Musculoskeletal: She exhibits edema. She exhibits no tenderness.       No paraspinous muscle tenderness  Neurological: She is alert and oriented to person, place, and time.  Skin: Skin is warm and dry.    ED Course  Procedures (including critical care time)  Labs Reviewed  CBC - Abnormal; Notable for the following:    RDW 16.4 (*)    Platelets 129 (*)    All other components within  normal limits  DIFFERENTIAL - Abnormal; Notable for the following:    Eosinophils Relative 6 (*)    All other components within normal limits   Dg Lumbar Spine Complete  02/21/2012  *RADIOLOGY REPORT*  Clinical Data: Bilateral leg and hip pain.  LUMBAR SPINE - COMPLETE 4+ VIEW  Comparison: Abdominal CT 06/04/2008  Findings: AP, lateral and oblique images of the lumbar spine were obtained.  There is disc space loss at T12-L1.  Vertebral body heights are maintained.  Degenerative endplate changes at the thoracolumbar junction.  IMPRESSION: No acute bony abnormality in the lumbar spine.  Degenerative changes at the thoracolumbar junction.  Original Report Authenticated By: Richarda Overlie, M.D.     1. Cramps, muscle, general       MDM  Will x-ray, lumbar spine, looking for stenosis or decrease in disc height.  This is most likely muscular in nature.  We'll check electrolytes to evaluate potassium status and she does use Lasix on a regular basis        Arman Filter, NP 02/21/12 2333  Arman Filter, NP 02/22/12 [id number]

## 2012-02-21 NOTE — ED Notes (Signed)
Patient transported to X-ray 

## 2012-02-21 NOTE — ED Notes (Addendum)
Pt back from xray, alert, NAD, calm, pleasant, c/o low back pain and bilateral hip pain, R>L, pain increases with movement, no shortening or rotation noted, CMS intact, bilateral peripheral edema noted, LE tight, pitting +3, ppp, flex and ext feet w/o increase in pain, denies leg pain, denies injury, also denies nvd fever urinary or vaginal sx. Pain onset Sunday/ Monday ~ 3d ago. "Lives alone, has close family that checks on her, does not use/need walker cane or w/c, performs ADLs herself w/o help".

## 2012-02-22 LAB — POCT I-STAT, CHEM 8
BUN: 23 mg/dL (ref 6–23)
BUN: 24 mg/dL — ABNORMAL HIGH (ref 6–23)
Calcium, Ion: 1.21 mmol/L (ref 1.12–1.32)
Calcium, Ion: 1.22 mmol/L (ref 1.12–1.32)
Chloride: 107 mEq/L (ref 96–112)
Chloride: 108 mEq/L (ref 96–112)
Creatinine, Ser: 1.4 mg/dL — ABNORMAL HIGH (ref 0.50–1.10)
Creatinine, Ser: 1.4 mg/dL — ABNORMAL HIGH (ref 0.50–1.10)
Glucose, Bld: 111 mg/dL — ABNORMAL HIGH (ref 70–99)
Glucose, Bld: 121 mg/dL — ABNORMAL HIGH (ref 70–99)
HCT: 38 % (ref 36.0–46.0)
HCT: 38 % (ref 36.0–46.0)
Hemoglobin: 12.9 g/dL (ref 12.0–15.0)
Hemoglobin: 12.9 g/dL (ref 12.0–15.0)
Potassium: 3.7 mEq/L (ref 3.5–5.1)
Potassium: 3.7 mEq/L (ref 3.5–5.1)
Sodium: 141 mEq/L (ref 135–145)
Sodium: 141 mEq/L (ref 135–145)
TCO2: 25 mmol/L (ref 0–100)
TCO2: 26 mmol/L (ref 0–100)

## 2012-02-22 MED ORDER — HYDROCODONE-ACETAMINOPHEN 7.5-500 MG/15ML PO SOLN: 15.0000 mL | Freq: Four times a day (QID) | ORAL | Status: AC | PRN

## 2012-02-22 MED ORDER — HYDROCODONE-ACETAMINOPHEN 5-325 MG PO TABS
1.0000 | ORAL_TABLET | Freq: Once | ORAL | Status: AC
  Administered 2012-02-22: 1 via ORAL
  Filled 2012-02-22: qty 1

## 2012-02-22 NOTE — Discharge Instructions (Signed)
Leg Cramps Leg cramps that occur during exercise can be caused by poor circulation or dehydration. However, muscle cramps that occur at rest or during the night are usually not due to any serious medical problem. Heat cramps may cause muscle spasms during hot weather.  CAUSES There is no clear cause for muscle cramps. However, dehydration may be a factor for those who do not drink enough fluids and those who exercise in the heat. Imbalances in the level of sodium, potassium, calcium or magnesium in the muscle tissue may also be a factor. Some medications, such as water pills (diuretics), may cause loss of chemicals that the body needs (like sodium and potassium) and cause muscle cramps. TREATMENT   Make sure your diet has enough fluids and essential minerals for the muscle to work normally.   Avoid strenuous exercise for several days if you have been having frequent leg cramps.   Stretch and massage the cramped muscle for several minutes.   Some medicines may be helpful in some patients with night cramps. Only take over-the-counter or prescription medicines as directed by your caregiver.  SEEK IMMEDIATE MEDICAL CARE IF:   Your leg cramps become worse.   Your foot becomes cold, numb, or blue.  Document Released: 01/18/2005 Document Revised: 08/23/2011 Document Reviewed: 01/05/2009 [facility] Patient Information 2012 [place], [place]. Today, your potassium level is normal i to normal or low back.  X-ray is normal.  Please follow up with Dr. Parke Simmers.  He be given a prescription for Lortab elixir.  Please take it as needed only for severe pain

## 2012-02-27 NOTE — ED Provider Notes (Signed)
Medical screening examination/treatment/procedure(s) were performed by non-physician practitioner and as supervising physician I was immediately available for consultation/collaboration.  Raeford Razor, MD 02/27/12 2147

## 2012-03-28 ENCOUNTER — Other Ambulatory Visit: Payer: Self-pay | Admitting: Family Medicine

## 2012-03-28 DIAGNOSIS — M79606 Pain in leg, unspecified: Secondary | ICD-10-CM

## 2012-03-29 ENCOUNTER — Other Ambulatory Visit: Payer: Medicare Other

## 2012-04-02 ENCOUNTER — Ambulatory Visit
Admission: RE | Admit: 2012-04-02 | Discharge: 2012-04-02 | Disposition: A | Discharge status: Home / Self Care | Payer: Medicare Other | Source: Ambulatory Visit | Attending: Family Medicine | Admitting: Family Medicine

## 2012-04-02 DIAGNOSIS — M79606 Pain in leg, unspecified: Secondary | ICD-10-CM

## 2012-07-15 ENCOUNTER — Other Ambulatory Visit: Payer: Self-pay | Admitting: Family Medicine

## 2012-07-15 DIAGNOSIS — Z1231 Encounter for screening mammogram for malignant neoplasm of breast: Secondary | ICD-10-CM

## 2012-08-02 ENCOUNTER — Ambulatory Visit: Payer: PRIVATE HEALTH INSURANCE

## 2012-08-07 ENCOUNTER — Ambulatory Visit
Admission: RE | Admit: 2012-08-07 | Discharge: 2012-08-07 | Disposition: A | Discharge status: Home / Self Care | Payer: PRIVATE HEALTH INSURANCE | Source: Ambulatory Visit | Attending: Family Medicine | Admitting: Family Medicine

## 2012-08-07 DIAGNOSIS — Z1231 Encounter for screening mammogram for malignant neoplasm of breast: Secondary | ICD-10-CM

## 2013-07-28 ENCOUNTER — Other Ambulatory Visit: Payer: Self-pay

## 2013-07-28 DIAGNOSIS — Z1231 Encounter for screening mammogram for malignant neoplasm of breast: Secondary | ICD-10-CM

## 2013-08-13 ENCOUNTER — Ambulatory Visit
Admission: RE | Admit: 2013-08-13 | Discharge: 2013-08-13 | Disposition: A | Discharge status: Home / Self Care | Payer: PRIVATE HEALTH INSURANCE | Source: Ambulatory Visit

## 2013-08-13 DIAGNOSIS — Z1231 Encounter for screening mammogram for malignant neoplasm of breast: Secondary | ICD-10-CM

## 2014-08-07 ENCOUNTER — Other Ambulatory Visit: Payer: Self-pay

## 2014-08-07 DIAGNOSIS — Z1231 Encounter for screening mammogram for malignant neoplasm of breast: Secondary | ICD-10-CM

## 2014-08-20 ENCOUNTER — Ambulatory Visit
Admission: RE | Admit: 2014-08-20 | Discharge: 2014-08-20 | Disposition: A | Discharge status: Home / Self Care | Payer: PRIVATE HEALTH INSURANCE | Source: Ambulatory Visit

## 2014-08-20 DIAGNOSIS — Z1231 Encounter for screening mammogram for malignant neoplasm of breast: Secondary | ICD-10-CM

## 2014-12-01 ENCOUNTER — Emergency Department (HOSPITAL_COMMUNITY): Payer: PRIVATE HEALTH INSURANCE

## 2014-12-01 ENCOUNTER — Encounter (HOSPITAL_COMMUNITY): Payer: Self-pay | Admitting: Emergency Medicine

## 2014-12-01 ENCOUNTER — Inpatient Hospital Stay (HOSPITAL_COMMUNITY)
Admission: EM | Admit: 2014-12-01 | Discharge: 2014-12-06 | DRG: 291 | Disposition: A | Discharge status: Home / Self Care | Payer: PRIVATE HEALTH INSURANCE | Attending: Internal Medicine | Admitting: Internal Medicine

## 2014-12-01 DIAGNOSIS — D696 Thrombocytopenia, unspecified: Secondary | ICD-10-CM | POA: Diagnosis present

## 2014-12-01 DIAGNOSIS — E785 Hyperlipidemia, unspecified: Secondary | ICD-10-CM | POA: Diagnosis present

## 2014-12-01 DIAGNOSIS — I5033 Acute on chronic diastolic (congestive) heart failure: Secondary | ICD-10-CM | POA: Diagnosis present

## 2014-12-01 DIAGNOSIS — I10 Essential (primary) hypertension: Secondary | ICD-10-CM

## 2014-12-01 DIAGNOSIS — I447 Left bundle-branch block, unspecified: Secondary | ICD-10-CM | POA: Diagnosis present

## 2014-12-01 DIAGNOSIS — R0602 Shortness of breath: Secondary | ICD-10-CM | POA: Diagnosis not present

## 2014-12-01 DIAGNOSIS — E119 Type 2 diabetes mellitus without complications: Secondary | ICD-10-CM | POA: Diagnosis present

## 2014-12-01 DIAGNOSIS — J9600 Acute respiratory failure, unspecified whether with hypoxia or hypercapnia: Secondary | ICD-10-CM | POA: Diagnosis present

## 2014-12-01 DIAGNOSIS — N184 Chronic kidney disease, stage 4 (severe): Secondary | ICD-10-CM | POA: Diagnosis present

## 2014-12-01 DIAGNOSIS — T501X5A Adverse effect of loop [high-ceiling] diuretics, initial encounter: Secondary | ICD-10-CM | POA: Diagnosis present

## 2014-12-01 DIAGNOSIS — D638 Anemia in other chronic diseases classified elsewhere: Secondary | ICD-10-CM | POA: Diagnosis present

## 2014-12-01 DIAGNOSIS — J189 Pneumonia, unspecified organism: Secondary | ICD-10-CM | POA: Diagnosis present

## 2014-12-01 DIAGNOSIS — I5032 Chronic diastolic (congestive) heart failure: Secondary | ICD-10-CM | POA: Diagnosis present

## 2014-12-01 DIAGNOSIS — I509 Heart failure, unspecified: Secondary | ICD-10-CM

## 2014-12-01 DIAGNOSIS — J9601 Acute respiratory failure with hypoxia: Secondary | ICD-10-CM

## 2014-12-01 DIAGNOSIS — D649 Anemia, unspecified: Secondary | ICD-10-CM | POA: Diagnosis present

## 2014-12-01 DIAGNOSIS — E876 Hypokalemia: Secondary | ICD-10-CM | POA: Diagnosis present

## 2014-12-01 DIAGNOSIS — I129 Hypertensive chronic kidney disease with stage 1 through stage 4 chronic kidney disease, or unspecified chronic kidney disease: Secondary | ICD-10-CM | POA: Diagnosis present

## 2014-12-01 DIAGNOSIS — Z7982 Long term (current) use of aspirin: Secondary | ICD-10-CM | POA: Diagnosis not present

## 2014-12-01 DIAGNOSIS — D631 Anemia in chronic kidney disease: Secondary | ICD-10-CM | POA: Diagnosis present

## 2014-12-01 HISTORY — DX: Hyperlipidemia, unspecified: E78.5

## 2014-12-01 LAB — HEPATIC FUNCTION PANEL
ALBUMIN: 3.9 g/dL (ref 3.5–5.2)
ALK PHOS: 60 U/L (ref 39–117)
ALT: 16 U/L (ref 0–35)
AST: 25 U/L (ref 0–37)
Bilirubin, Direct: <0.2 mg/dL (ref 0.0–0.3)
TOTAL PROTEIN: 8.2 g/dL (ref 6.0–8.3)
Total Bilirubin: 0.4 mg/dL (ref 0.3–1.2)

## 2014-12-01 LAB — I-STAT CHEM 8, ED
BUN: 23 mg/dL (ref 6–23)
CHLORIDE: 104 meq/L (ref 96–112)
Calcium, Ion: 1.25 mmol/L (ref 1.13–1.30)
Creatinine, Ser: 1.2 mg/dL — ABNORMAL HIGH (ref 0.50–1.10)
Glucose, Bld: 209 mg/dL — ABNORMAL HIGH (ref 70–99)
HEMATOCRIT: 41 % (ref 36.0–46.0)
Hemoglobin: 13.9 g/dL (ref 12.0–15.0)
POTASSIUM: 3.3 meq/L — AB (ref 3.7–5.3)
Sodium: 142 mEq/L (ref 137–147)
TCO2: 26 mmol/L (ref 0–100)

## 2014-12-01 LAB — CBC
HEMATOCRIT: 35.4 % — AB (ref 36.0–46.0)
Hemoglobin: 11.5 g/dL — ABNORMAL LOW (ref 12.0–15.0)
MCH: 26.2 pg (ref 26.0–34.0)
MCHC: 32.5 g/dL (ref 30.0–36.0)
MCV: 80.6 fL (ref 78.0–100.0)
PLATELETS: 116 10*3/uL — AB (ref 150–400)
RBC: 4.39 MIL/uL (ref 3.87–5.11)
RDW: 16.6 % — AB (ref 11.5–15.5)
WBC: 6.5 10*3/uL (ref 4.0–10.5)

## 2014-12-01 LAB — TROPONIN I: Troponin I: <0.3 ng/mL (ref <0.30)

## 2014-12-01 LAB — PRO B NATRIURETIC PEPTIDE: PRO B NATRI PEPTIDE: 576.4 pg/mL — AB (ref 0–450)

## 2014-12-01 LAB — URINALYSIS, ROUTINE W REFLEX MICROSCOPIC
Bilirubin Urine: NEGATIVE
GLUCOSE, UA: 250 mg/dL — AB
HGB URINE DIPSTICK: NEGATIVE
KETONES UR: NEGATIVE mg/dL
LEUKOCYTES UA: NEGATIVE
Nitrite: NEGATIVE
PH: 7.5 (ref 5.0–8.0)
PROTEIN: NEGATIVE mg/dL
Specific Gravity, Urine: 1.011 (ref 1.005–1.030)
Urobilinogen, UA: 0.2 mg/dL (ref 0.0–1.0)

## 2014-12-01 LAB — BASIC METABOLIC PANEL
ANION GAP: 14 (ref 5–15)
BUN: 22 mg/dL (ref 6–23)
CALCIUM: 10.4 mg/dL (ref 8.4–10.5)
CHLORIDE: 100 meq/L (ref 96–112)
CO2: 26 mEq/L (ref 19–32)
CREATININE: 1.1 mg/dL (ref 0.50–1.10)
GFR calc non Af Amer: 45 mL/min — ABNORMAL LOW (ref >90)
GFR, EST AFRICAN AMERICAN: 52 mL/min — AB (ref >90)
Glucose, Bld: 207 mg/dL — ABNORMAL HIGH (ref 70–99)
Potassium: 3.4 mEq/L — ABNORMAL LOW (ref 3.7–5.3)
Sodium: 140 mEq/L (ref 137–147)

## 2014-12-01 LAB — I-STAT TROPONIN, ED: Troponin i, poc: 0.02 ng/mL (ref 0.00–0.08)

## 2014-12-01 LAB — LIPASE, BLOOD: Lipase: 14 U/L (ref 11–59)

## 2014-12-01 MED ORDER — SODIUM CHLORIDE 0.9 % IJ SOLN
3.0000 mL | Freq: Two times a day (BID) | INTRAMUSCULAR | Status: DC
  Administered 2014-12-05 – 2014-12-06 (×3): 3 mL via INTRAVENOUS

## 2014-12-01 MED ORDER — ENOXAPARIN SODIUM 40 MG/0.4ML ~~LOC~~ SOLN
40.0000 mg | Freq: Every day | SUBCUTANEOUS | Status: DC
Start: 2014-12-01 — End: 2014-12-02
  Administered 2014-12-02: 40 mg via SUBCUTANEOUS
  Filled 2014-12-01 (×2): qty 0.4

## 2014-12-01 MED ORDER — PIOGLITAZONE HCL 45 MG PO TABS
45.0000 mg | ORAL_TABLET | Freq: Every morning | ORAL | Status: DC
  Administered 2014-12-02: 45 mg via ORAL
  Filled 2014-12-01: qty 1

## 2014-12-01 MED ORDER — EZETIMIBE-SIMVASTATIN 10-20 MG PO TABS
1.0000 | ORAL_TABLET | Freq: Every day | ORAL | Status: DC
  Administered 2014-12-02 – 2014-12-05 (×5): 1 via ORAL
  Filled 2014-12-01 (×5): qty 1

## 2014-12-01 MED ORDER — HYDROCHLOROTHIAZIDE 25 MG PO TABS
25.0000 mg | ORAL_TABLET | Freq: Every day | ORAL | Status: DC
  Administered 2014-12-02: 25 mg via ORAL
  Filled 2014-12-01: qty 1

## 2014-12-01 MED ORDER — OMEGA-3-ACID ETHYL ESTERS 1 G PO CAPS
1.0000 g | ORAL_CAPSULE | Freq: Every morning | ORAL | Status: DC
  Administered 2014-12-02 – 2014-12-06 (×5): 1 g via ORAL
  Filled 2014-12-01 (×5): qty 1

## 2014-12-01 MED ORDER — DEXTROSE 5 % IV SOLN
500.0000 mg | INTRAVENOUS | Status: DC
  Administered 2014-12-02 – 2014-12-05 (×4): 500 mg via INTRAVENOUS
  Filled 2014-12-01 (×4): qty 500

## 2014-12-01 MED ORDER — ACETAMINOPHEN 650 MG RE SUPP
650.0000 mg | Freq: Four times a day (QID) | RECTAL | Status: DC | PRN
Start: 2014-12-01 — End: 2014-12-06

## 2014-12-01 MED ORDER — FUROSEMIDE 20 MG PO TABS
20.0000 mg | ORAL_TABLET | Freq: Every morning | ORAL | Status: DC
  Administered 2014-12-02 – 2014-12-03 (×2): 20 mg via ORAL
  Filled 2014-12-01 (×2): qty 1

## 2014-12-01 MED ORDER — DEXTROSE 5 % IV SOLN
500.0000 mg | Freq: Once | INTRAVENOUS | Status: AC
  Administered 2014-12-01: 500 mg via INTRAVENOUS
  Filled 2014-12-01: qty 500

## 2014-12-01 MED ORDER — ACETAMINOPHEN 325 MG PO TABS
650.0000 mg | ORAL_TABLET | Freq: Four times a day (QID) | ORAL | Status: DC | PRN
Start: 2014-12-01 — End: 2014-12-06

## 2014-12-01 MED ORDER — INSULIN ASPART 100 UNIT/ML ~~LOC~~ SOLN
0.0000 [IU] | Freq: Three times a day (TID) | SUBCUTANEOUS | Status: DC
  Administered 2014-12-02: 2 [IU] via SUBCUTANEOUS
  Administered 2014-12-03: 3 [IU] via SUBCUTANEOUS
  Administered 2014-12-04: 2 [IU] via SUBCUTANEOUS
  Administered 2014-12-05 – 2014-12-06 (×3): 1 [IU] via SUBCUTANEOUS

## 2014-12-01 MED ORDER — CARVEDILOL 25 MG PO TABS
25.0000 mg | ORAL_TABLET | Freq: Two times a day (BID) | ORAL | Status: DC
  Administered 2014-12-02 – 2014-12-06 (×9): 25 mg via ORAL
  Filled 2014-12-01 (×9): qty 1

## 2014-12-01 MED ORDER — IRBESARTAN 300 MG PO TABS
300.0000 mg | ORAL_TABLET | Freq: Every day | ORAL | Status: DC
  Administered 2014-12-02: 300 mg via ORAL
  Filled 2014-12-01: qty 1

## 2014-12-01 MED ORDER — CEFTRIAXONE SODIUM 1 G IJ SOLR
1.0000 g | Freq: Once | INTRAMUSCULAR | Status: AC
  Administered 2014-12-01: 1 g via INTRAVENOUS
  Filled 2014-12-01: qty 10

## 2014-12-01 MED ORDER — ONDANSETRON HCL 4 MG/2ML IJ SOLN: 4.0000 mg | Freq: Four times a day (QID) | INTRAMUSCULAR | Status: DC | PRN

## 2014-12-01 MED ORDER — CETYLPYRIDINIUM CHLORIDE 0.05 % MT LIQD
7.0000 mL | Freq: Two times a day (BID) | OROMUCOSAL | Status: DC
  Administered 2014-12-02 – 2014-12-06 (×10): 7 mL via OROMUCOSAL

## 2014-12-01 MED ORDER — SODIUM CHLORIDE 0.9 % IJ SOLN
3.0000 mL | Freq: Two times a day (BID) | INTRAMUSCULAR | Status: DC
  Administered 2014-12-04 (×2): 3 mL via INTRAVENOUS

## 2014-12-01 MED ORDER — ONDANSETRON HCL 4 MG PO TABS: 4.0000 mg | ORAL_TABLET | Freq: Four times a day (QID) | ORAL | Status: DC | PRN

## 2014-12-01 MED ORDER — FUROSEMIDE 10 MG/ML IJ SOLN
40.0000 mg | Freq: Once | INTRAMUSCULAR | Status: AC
  Administered 2014-12-02: 40 mg via INTRAVENOUS
  Filled 2014-12-01: qty 4

## 2014-12-01 MED ORDER — CEFTRIAXONE SODIUM IN DEXTROSE 20 MG/ML IV SOLN
1.0000 g | INTRAVENOUS | Status: DC
  Administered 2014-12-02 – 2014-12-05 (×4): 1 g via INTRAVENOUS
  Filled 2014-12-01 (×4): qty 50

## 2014-12-01 MED ORDER — AMLODIPINE BESYLATE 10 MG PO TABS
10.0000 mg | ORAL_TABLET | Freq: Every day | ORAL | Status: DC
  Administered 2014-12-02 – 2014-12-06 (×5): 10 mg via ORAL
  Filled 2014-12-01 (×5): qty 1

## 2014-12-01 MED ORDER — ASPIRIN 81 MG PO CHEW
81.0000 mg | CHEWABLE_TABLET | Freq: Every day | ORAL | Status: DC
  Administered 2014-12-02 – 2014-12-05 (×4): 81 mg via ORAL
  Filled 2014-12-01 (×5): qty 1

## 2014-12-01 MED ORDER — VALSARTAN-HYDROCHLOROTHIAZIDE 320-25 MG PO TABS: 1.0000 | ORAL_TABLET | Freq: Every day | ORAL | Status: DC

## 2014-12-01 NOTE — ED Notes (Signed)
Spoke to lab, they will add on the hepatic fx panel and lipase.

## 2014-12-01 NOTE — ED Notes (Signed)
Bed: RC16 Expected date:  Expected time:  Means of arrival:  Comments: EMS 80 yo female from home N/V/D

## 2014-12-01 NOTE — ED Provider Notes (Signed)
CSN: [id number]     Arrival date & time 12/01/14  1903 History   First MD Initiated Contact with Patient 12/01/14 1907     Chief Complaint  Patient presents with  . Abdominal Pain  . Nausea  . Fatigue     (Consider location/radiation/quality/duration/timing/severity/associated sxs/prior Treatment) Patient is a 80 y.o. female presenting with abdominal pain and weakness. The history is provided by the patient.  Abdominal Pain Associated symptoms: shortness of breath   Associated symptoms: no chest pain, no chills and no fever   Weakness This is a new problem. The current episode started 3 to 5 hours ago. The problem occurs constantly. The problem has been gradually improving. Associated symptoms include abdominal pain and shortness of breath. Pertinent negatives include no chest pain. Nothing aggravates the symptoms. Nothing relieves the symptoms. She has tried nothing for the symptoms.    Past Medical History  Diagnosis Date  . Diabetes mellitus   . Hypertension    No past surgical history on file. No family history on file. History  Substance Use Topics  . Smoking status: Not on file  . Smokeless tobacco: Not on file  . Alcohol Use: No   OB History    No data available     Review of Systems  Constitutional: Negative for fever and chills.  Respiratory: Positive for shortness of breath.   Cardiovascular: Negative for chest pain and leg swelling.  Gastrointestinal: Positive for abdominal pain.  Neurological: Positive for weakness.  All other systems reviewed and are negative.     Allergies  Sitagliptin phosphate  Home Medications   Prior to Admission medications   Medication Sig Start Date End Date Taking? Authorizing Provider  Amlodipine-Valsartan-HCTZ (EXFORGE HCT) 10-320-25 MG TABS Take 1 tablet by mouth daily.    Historical Provider, MD  Ascorbic Acid (VITAMIN C) 1000 MG tablet Take 1,000 mg by mouth every morning.    Historical Provider, MD  calcium-vitamin  D (OSCAL WITH D) 500-200 MG-UNIT per tablet Take 1 tablet by mouth every morning.    Historical Provider, MD  cloNIDine (CATAPRES) 0.2 MG tablet Take 0.2 mg by mouth 2 (two) times daily.    Historical Provider, MD  ezetimibe-simvastatin (VYTORIN) 10-20 MG per tablet Take 1 tablet by mouth at bedtime.    Historical Provider, MD  fish oil-omega-3 fatty acids 1000 MG capsule Take 1 g by mouth every morning.    Historical Provider, MD  furosemide (LASIX) 20 MG tablet Take 20 mg by mouth every morning.    Historical Provider, MD  glimepiride (AMARYL) 2 MG tablet Take 2 mg by mouth daily before breakfast.    Historical Provider, MD  Multiple Vitamin (MULITIVITAMIN WITH MINERALS) TABS Take 1 tablet by mouth daily.    Historical Provider, MD  pioglitazone (ACTOS) 45 MG tablet Take 45 mg by mouth every morning.    Historical Provider, MD   BP 171/76 mmHg  Pulse 70  Temp(Src) 97.6 F (36.4 C) (Oral)  Resp 12  SpO2 90% Physical Exam  Constitutional: She is oriented to person, place, and time. She appears well-developed and well-nourished. No distress.  HENT:  Head: Normocephalic and atraumatic.  Mouth/Throat: Oropharynx is clear and moist.  Eyes: EOM are normal. Pupils are equal, round, and reactive to light.  Neck: Normal range of motion. Neck supple.  Cardiovascular: Normal rate and regular rhythm.  Exam reveals no friction rub.   No murmur heard. Pulmonary/Chest: Effort normal. No respiratory distress. She has no wheezes. She has rales (  bibasilar).  Abdominal: Soft. She exhibits no distension. There is no tenderness. There is no rebound.  Musculoskeletal: Normal range of motion. She exhibits no edema.  Neurological: She is alert and oriented to person, place, and time. She exhibits normal muscle tone. Coordination normal.  Skin: No rash noted. She is not diaphoretic.  Nursing note and vitals reviewed.   ED Course  Procedures (including critical care time) Labs Review Labs Reviewed  CBC   BASIC METABOLIC PANEL  URINALYSIS, ROUTINE W REFLEX MICROSCOPIC  PRO B NATRIURETIC PEPTIDE  TROPONIN I  I-STAT TROPOININ, ED  I-STAT CG4 LACTIC ACID, ED    Imaging Review Dg Chest Portable 1 View  12/01/2014   CLINICAL DATA:  Abdominal pain, nauseated, diarrhea for 1 day, no chest pain  EXAM: PORTABLE CHEST - 1 VIEW  COMPARISON:  10/03/2009  FINDINGS: Right upper lobe, right lower lobe and left lower lobe airspace disease. Bilateral mild interstitial thickening. No pleural effusion or pneumothorax. Stable cardiomediastinal silhouette. No acute osseous abnormality.  IMPRESSION: Right upper lobe, right lower lobe and left lower lobe airspace disease most concerning for multilobar pneumonia.   Electronically Signed   By: Kathreen Devoid   On: 12/01/2014 20:07     EKG Interpretation None      Date: 12/01/2014  Rate: 71  Rhythm: normal sinus rhythm  QRS Axis: normal  Intervals: QRS prolonged  ST/T Wave abnormalities: normal  Conduction Disutrbances:left bundle branch block  Narrative Interpretation:   Old EKG Reviewed: unchanged   MDM   Final diagnoses:  Shortness of breath  Bilateral pneumonia    6F here with weakness. Began today when walking around [place]. Felt she was unable to take a step. Had one episode of vomiting at home, none now. Patient denies any CP. Mild SOB here, but doesn't feel any difficulty breathing. Initially very hypoxic, doing well with 5 L [place]. Patient with some basilar rales, no rhonchi.  Talking in complete sentences.  EKG similar to prior. CXR shows multilobar pneumonia. CAP coverage given. Plan for admission. I have reviewed all labs and imaging and considered them in my medical decision making.     Evelina Bucy, MD 12/01/14 2104

## 2014-12-01 NOTE — Progress Notes (Signed)
ANTIBIOTIC CONSULT NOTE - INITIAL  Pharmacy Consult for Rocephin Indication: pneumonia  Allergies  Allergen Reactions  . Sitagliptin Phosphate     REACTION: Finger and hand tingling RUQ pain    Patient Measurements: Height: 5\' 4"  (162.6 cm) Weight: 223 lb 12.3 oz (101.5 kg) IBW/kg (Calculated) : 54.7   Vital Signs: Temp: 97.6 F (36.4 C) (12/08 1909) Temp Source: Oral (12/08 1909) BP: 190/82 mmHg (12/08 2230) Pulse Rate: 73 (12/08 2230) Intake/Output from previous day:   Intake/Output from this shift:    Labs:  Recent Labs  12/01/14 1927 12/01/14 2034  WBC 6.5  --   HGB 11.5* 13.9  PLT 116*  --   CREATININE 1.10 1.20*   Estimated Creatinine Clearance: 41.2 mL/min (by C-G formula based on Cr of 1.2). No results for input(s): VANCOTROUGH, VANCOPEAK, VANCORANDOM, GENTTROUGH, GENTPEAK, GENTRANDOM, TOBRATROUGH, TOBRAPEAK, TOBRARND, AMIKACINPEAK, AMIKACINTROU, AMIKACIN in the last 72 hours.   Microbiology: No results found for this or any previous visit (from the past 720 hour(s)).  Medical History: Past Medical History  Diagnosis Date  . Diabetes mellitus   . Hypertension   . Hyperlipidemia     Medications:  Scheduled:  . [START ON 12/02/2014] amLODipine  10 mg Oral Daily  . [START ON 12/02/2014] aspirin  81 mg Oral Daily  . [START ON 12/02/2014] azithromycin  500 mg Intravenous Q24H  . [START ON 12/02/2014] carvedilol  25 mg Oral BID WC  . [START ON 12/02/2014] cefTRIAXone (ROCEPHIN)  IV  1 g Intravenous Q24H  . enoxaparin (LOVENOX) injection  40 mg Subcutaneous QHS  . ezetimibe-simvastatin  1 tablet Oral QHS  . furosemide  40 mg Intravenous Once  . [START ON 12/02/2014] furosemide  20 mg Oral q morning - 10a  . [START ON 12/02/2014] irbesartan  300 mg Oral Daily   And  . [START ON 12/02/2014] hydrochlorothiazide  25 mg Oral Daily  . [START ON 12/02/2014] insulin aspart  0-9 Units Subcutaneous TID WC  . [START ON 12/02/2014] omega-3 acid ethyl esters  1 g Oral q  morning - 10a  . [START ON 12/02/2014] pioglitazone  45 mg Oral q morning - 10a  . sodium chloride  3 mL Intravenous Q12H  . sodium chloride  3 mL Intravenous Q12H   Infusions:   Assessment: 76 yoF c/o SOB found to have multilobar PNA. Rocephin per Rx and Zmax per MD for CAP.   Goal of Therapy:  Vancomycin trough level 15-20 mcg/ml  Plan:   Rocephin 1Gm IV q24h  Zmax 500mg  IV q24h (MD)  F/u cultures as needed  Dorrene German 12/01/2014,11:36 PM

## 2014-12-01 NOTE — ED Notes (Signed)
Hospitalist at bedside. Floor notified of delay in transport.

## 2014-12-01 NOTE — ED Notes (Signed)
IV started but unable to obtain blood. Attempted blood draw x 2. Phlebotomy notified.

## 2014-12-01 NOTE — ED Notes (Signed)
Per EMS pt comes from home c/o abd pain, weakness, nauseated vomited and had diarrhea once.  No shob, no chest pain. Family wanted her to be seen bc wanted to rule out cardiac.  Pt has PMH CHF.  CBG 157  Per EMS their 12 lead showed 1st abd bundle branch block.

## 2014-12-01 NOTE — H&P (Signed)
[facility] History and Physical  Rachel Copeland OZH:[id number] DOB: Aug 03, 1929 DOA: 12/01/2014  Referring physician: ER physician. PCP: Elyn Peers, MD   Chief Complaint: Weakness and shortness of breath and nausea vomiting.  HPI: Rachel Copeland is a 80 y.o. female with history of CHF unspecified EF not known, diabetes mellitus, hypertension and hyperlipidemia presented to the ER because of weakness and shortness of breath. Patient also had one episode of nausea vomiting this evening. Patient states that she's been feeling weak last few days with exertional shortness of breath and productive cough. Denies any chest pain fever chills. In the ER patient was found to be hypoxic and had to be initially placed on 5 L of oxygen to maintain sats. Chest x-ray shows multilobar pneumonia. On exam patient is not in acute distress. Abdomen appears benign. LFTs and lipase are normal and troponins were negative. EKG shows LBBB with no old EKG to compare. Patient states she does have a history of CHF and takes Lasix 20 mg daily. Patient states that she has not missed her medications. Patient will be admitted for acute respiratory failure probably from pneumonia.   Review of Systems: As presented in the history of presenting illness, rest negative.  Past Medical History  Diagnosis Date  . Diabetes mellitus   . Hypertension   . Hyperlipidemia    History reviewed. No pertinent past surgical history. Social History:  reports that she has never smoked. She does not have any smokeless tobacco history on file. She reports that she does not drink alcohol or use illicit drugs. Where does patient live home. Can patient participate in ADLs? Yes.  Allergies  Allergen Reactions  . Sitagliptin Phosphate     REACTION: Finger and hand tingling RUQ pain    Family History:  Family History  Problem Relation Age of Onset  . Colon cancer Mother   . Diabetes Mellitus II Brother   . Diabetes  Mellitus II Daughter       Prior to Admission medications   Medication Sig Start Date End Date Taking? Authorizing Provider  amLODipine (NORVASC) 10 MG tablet Take 10 mg by mouth daily.   Yes Historical Provider, MD  Ascorbic Acid (VITAMIN C) 1000 MG tablet Take 1,000 mg by mouth every morning.   Yes Historical Provider, MD  aspirin 81 MG tablet Take 81 mg by mouth daily.   Yes Historical Provider, MD  calcium-vitamin D (OSCAL WITH D) 500-200 MG-UNIT per tablet Take 1 tablet by mouth every morning.   Yes Historical Provider, MD  carvedilol (COREG) 25 MG tablet Take 25 mg by mouth 2 (two) times daily with a meal.   Yes Historical Provider, MD  ezetimibe-simvastatin (VYTORIN) 10-20 MG per tablet Take 1 tablet by mouth at bedtime.   Yes Historical Provider, MD  fish oil-omega-3 fatty acids 1000 MG capsule Take 1 g by mouth every morning.   Yes Historical Provider, MD  furosemide (LASIX) 20 MG tablet Take 20 mg by mouth every morning.   Yes Historical Provider, MD  pioglitazone (ACTOS) 45 MG tablet Take 45 mg by mouth every morning.   Yes Historical Provider, MD  valsartan-hydrochlorothiazide (DIOVAN-HCT) 320-25 MG per tablet Take 1 tablet by mouth daily.   Yes Historical Provider, MD    Physical Exam: Filed Vitals:   12/01/14 2105 12/01/14 2130 12/01/14 2200 12/01/14 2230  BP: 202/90 179/86 173/67 190/82  Pulse: 77 74 73 73  Temp:      TempSrc:      Resp:  24 16 23 19   SpO2: 92% 93% 95% 96%     General:  Obese not in acute distress.  Eyes: Anicteric no pallor.  ENT: No discharge from the ears eyes nose more.  Neck: JVD mildly elevated. No mass felt.  Cardiovascular: S1 and S2 heard.  Respiratory: No rhonchi or crepitations.  Abdomen: Soft nontender bowel sounds present.  Skin: No rash.  Musculoskeletal: No edema.  Psychiatric: Appears normal.  Neurologic: Alert awake oriented to time place and person. Moves all extremities.  Labs on Admission:  Basic Metabolic  Panel:  Recent Labs Lab 12/01/14 1927 12/01/14 2034  NA 140 142  K 3.4* 3.3*  CL 100 104  CO2 26  --   GLUCOSE 207* 209*  BUN 22 23  CREATININE 1.10 1.20*  CALCIUM 10.4  --    Liver Function Tests:  Recent Labs Lab 12/01/14 2202  AST 25  ALT 16  ALKPHOS 60  BILITOT 0.4  PROT 8.2  ALBUMIN 3.9    Recent Labs Lab 12/01/14 2202  LIPASE 14   No results for input(s): AMMONIA in the last 168 hours. CBC:  Recent Labs Lab 12/01/14 1927 12/01/14 2034  WBC 6.5  --   HGB 11.5* 13.9  HCT 35.4* 41.0  MCV 80.6  --   PLT 116*  --    Cardiac Enzymes:  Recent Labs Lab 12/01/14 1927  TROPONINI <0.30    BNP (last 3 results)  Recent Labs  12/01/14 1928  PROBNP 576.4*   CBG: No results for input(s): GLUCAP in the last 168 hours.  Radiological Exams on Admission: Dg Chest Portable 1 View  12/01/2014   CLINICAL DATA:  Abdominal pain, nauseated, diarrhea for 1 day, no chest pain  EXAM: PORTABLE CHEST - 1 VIEW  COMPARISON:  10/03/2009  FINDINGS: Right upper lobe, right lower lobe and left lower lobe airspace disease. Bilateral mild interstitial thickening. No pleural effusion or pneumothorax. Stable cardiomediastinal silhouette. No acute osseous abnormality.  IMPRESSION: Right upper lobe, right lower lobe and left lower lobe airspace disease most concerning for multilobar pneumonia.   Electronically Signed   By: Kathreen Devoid   On: 12/01/2014 20:07    EKG: Independently reviewed. Normal sinus rhythm with LBBB.  Assessment/Plan Active Problems:   Pneumonia   Acute respiratory failure   Diabetes mellitus type 2, controlled   CHF (congestive heart failure)   Hypertension   Normocytic anemia   Thrombocytopenia   Acute respiratory failure with hypoxia   1. Acute respiratory failure with hypoxia from pneumonia - patient has been placed on ceftriaxone and Zithromax to treat for community-acquired pneumonia. Check influenza PCR and urine Legionella and strep antigen. I  think patient also has a mildly incompetent of CHF for which I have ordered Lasix 40 mg IV 1 dose and will continue with patient's home dose of Lasix 20 mg by mouth daily. Lasix dose may need to be titrated based on patient's respiratory status. Closely follow intake and output and metabolic panel and daily weights. Since patient also has exertional symptoms and mildly elevated JVD I have ordered a 2-D echo. Cycle cardiac markers. 2. CHF EF not known - EKG does show LBBB. See #1. Follow 2-D echo results.  3. Nausea vomiting - patient's abdomen appears benign and LFTs and lipase are normal. Nausea vomiting may be related to pneumonia. Closely observe. 4. Diabetes mellitus type 2 - patient is on Actos and if patient does have CHF may need to be off Actos eventually. Closely  follow CBGs with sliding scale coverage. 5. Normocytic anemia - patient states he has had a normal colonoscopy 2 years ago. Follow CBC. 6. Thrombocytopenia - follow CBC closely. May be related to infectious process. 7. Hypertension uncontrolled - continue home medications. I have also place patient on when necessary IV hydralazine for systolic blood pressure more than 160. 8. Hyperlipidemia - continue home medications.    Code Status: Full code.  Family Communication: Patient's daughter at the bedside.  Disposition Plan: Admit to inpatient.    Nyna Chilton N. [facility] Pager [phone].  If 7PM-7AM, please contact night-coverage [email or web address] Password [email or web address] 12/01/2014, 11:11 PM

## 2014-12-02 DIAGNOSIS — D638 Anemia in other chronic diseases classified elsewhere: Secondary | ICD-10-CM

## 2014-12-02 DIAGNOSIS — I359 Nonrheumatic aortic valve disorder, unspecified: Secondary | ICD-10-CM

## 2014-12-02 DIAGNOSIS — I5032 Chronic diastolic (congestive) heart failure: Secondary | ICD-10-CM | POA: Diagnosis present

## 2014-12-02 DIAGNOSIS — J189 Pneumonia, unspecified organism: Secondary | ICD-10-CM | POA: Diagnosis present

## 2014-12-02 DIAGNOSIS — N184 Chronic kidney disease, stage 4 (severe): Secondary | ICD-10-CM | POA: Diagnosis present

## 2014-12-02 DIAGNOSIS — E785 Hyperlipidemia, unspecified: Secondary | ICD-10-CM | POA: Diagnosis present

## 2014-12-02 DIAGNOSIS — I10 Essential (primary) hypertension: Secondary | ICD-10-CM | POA: Diagnosis present

## 2014-12-02 DIAGNOSIS — D696 Thrombocytopenia, unspecified: Secondary | ICD-10-CM | POA: Diagnosis present

## 2014-12-02 LAB — GLUCOSE, CAPILLARY
GLUCOSE-CAPILLARY: 177 mg/dL — AB (ref 70–99)
Glucose-Capillary: 92 mg/dL (ref 70–99)
Glucose-Capillary: 113 mg/dL — ABNORMAL HIGH (ref 70–99)
Glucose-Capillary: 159 mg/dL — ABNORMAL HIGH (ref 70–99)

## 2014-12-02 LAB — COMPREHENSIVE METABOLIC PANEL
ALBUMIN: 3.1 g/dL — AB (ref 3.5–5.2)
ALK PHOS: 49 U/L (ref 39–117)
ALT: 12 U/L (ref 0–35)
ANION GAP: 13 (ref 5–15)
AST: 18 U/L (ref 0–37)
BILIRUBIN TOTAL: 0.3 mg/dL (ref 0.3–1.2)
BUN: 23 mg/dL (ref 6–23)
CHLORIDE: 101 meq/L (ref 96–112)
CO2: 26 mEq/L (ref 19–32)
Calcium: 9.4 mg/dL (ref 8.4–10.5)
Creatinine, Ser: 1.27 mg/dL — ABNORMAL HIGH (ref 0.50–1.10)
GFR calc Af Amer: 44 mL/min — ABNORMAL LOW (ref >90)
GFR calc non Af Amer: 38 mL/min — ABNORMAL LOW (ref >90)
Glucose, Bld: 117 mg/dL — ABNORMAL HIGH (ref 70–99)
POTASSIUM: 3.1 meq/L — AB (ref 3.7–5.3)
Sodium: 140 mEq/L (ref 137–147)
Total Protein: 6.8 g/dL (ref 6.0–8.3)

## 2014-12-02 LAB — CBC WITH DIFFERENTIAL/PLATELET
BASOS ABS: 0 10*3/uL (ref 0.0–0.1)
Basophils Relative: 0 % (ref 0–1)
EOS ABS: 0.1 10*3/uL (ref 0.0–0.7)
EOS PCT: 1 % (ref 0–5)
HCT: 31.1 % — ABNORMAL LOW (ref 36.0–46.0)
Hemoglobin: 10.2 g/dL — ABNORMAL LOW (ref 12.0–15.0)
Lymphocytes Relative: 18 % (ref 12–46)
Lymphs Abs: 1 10*3/uL (ref 0.7–4.0)
MCH: 26.6 pg (ref 26.0–34.0)
MCHC: 32.8 g/dL (ref 30.0–36.0)
MCV: 81 fL (ref 78.0–100.0)
Monocytes Absolute: 0.5 10*3/uL (ref 0.1–1.0)
Monocytes Relative: 9 % (ref 3–12)
Neutro Abs: 4.1 10*3/uL (ref 1.7–7.7)
Neutrophils Relative %: 71 % (ref 43–77)
Platelets: 117 10*3/uL — ABNORMAL LOW (ref 150–400)
RBC: 3.84 MIL/uL — ABNORMAL LOW (ref 3.87–5.11)
RDW: 16.8 % — AB (ref 11.5–15.5)
WBC: 5.8 10*3/uL (ref 4.0–10.5)

## 2014-12-02 LAB — TROPONIN I
Troponin I: <0.3 ng/mL (ref <0.30)
Troponin I: <0.3 ng/mL (ref <0.30)

## 2014-12-02 LAB — STREP PNEUMONIAE URINARY ANTIGEN: Strep Pneumo Urinary Antigen: NEGATIVE

## 2014-12-02 LAB — INFLUENZA PANEL BY PCR (TYPE A & B)
H1N1 flu by pcr: NOT DETECTED
Influenza A By PCR: NEGATIVE
Influenza B By PCR: NEGATIVE

## 2014-12-02 MED ORDER — HYDRALAZINE HCL 20 MG/ML IJ SOLN: 10.0000 mg | INTRAMUSCULAR | Status: DC | PRN

## 2014-12-02 MED ORDER — HYDRALAZINE HCL 10 MG PO TABS: 10.0000 mg | ORAL_TABLET | Freq: Three times a day (TID) | ORAL | Status: DC

## 2014-12-02 MED ORDER — POTASSIUM CHLORIDE CRYS ER 20 MEQ PO TBCR
40.0000 meq | EXTENDED_RELEASE_TABLET | Freq: Once | ORAL | Status: AC
  Administered 2014-12-02: 40 meq via ORAL
  Filled 2014-12-02: qty 2

## 2014-12-02 MED ORDER — IPRATROPIUM-ALBUTEROL 0.5-2.5 (3) MG/3ML IN SOLN: 3.0000 mL | RESPIRATORY_TRACT | Status: DC | PRN

## 2014-12-02 NOTE — Care Management Note (Addendum)
    Page 1 of 1   12/04/2014     2:58:48 PM CARE MANAGEMENT NOTE 12/04/2014  Patient:  Rachel Copeland, Rachel Copeland   Account Number:  [id number]  Date Initiated:  12/02/2014  Documentation initiated by:  [facility]  Subjective/Objective Assessment:   80 Y/O F ADMITTED W/ACUTE RESP FAILURE/PNA.     Action/Plan:   FROM HOME ALONE.   Anticipated DC Date:  12/04/2014   Anticipated DC Plan:  [place]  CM consult      Choice offered to / List presented to:  C-1 Patient           Status of service:  In process, will continue to follow Medicare Important Message given?  YES (If response is "NO", the following Medicare IM given date fields will be blank) Date Medicare IM given:  12/04/2014 Medicare IM given by:  [facility] Date Additional Medicare IM given:   Additional Medicare IM given by:    Discharge Disposition:    Per UR Regulation:  Reviewed for med. necessity/level of care/duration of stay  If discussed at [place] of Stay Meetings, dates discussed:    Comments:  12/04/14 Dessa Phi RN BSN NCM [phone] Patient initially chose [facility] for [facility], they are not in insurance, then patient chose [facility] kristen aware of d/c & hhc recommendations.Await final hhc orders.  12/03/14 Dessa Phi RN BSN NCM [phone] PT-HHPT.Provided patient w/HHC agency list, await choice.await final HHPT order, & face to face.  12/02/14 Sheridan Gettel RN,BSN NCM [phone] RECOMMEND PT CONS WHEN APPROPRIATE.

## 2014-12-02 NOTE — Plan of Care (Signed)
Problem: Phase I Progression Outcomes Goal: OOB as tolerated unless otherwise ordered Outcome: Completed/Met Date Met:  12/02/14 Goal: First antibiotic given within 6hrs of admit Outcome: Completed/Met Date Met:  12/02/14 Goal: Voiding-avoid urinary catheter unless indicated Outcome: Completed/Met Date Met:  12/02/14 Goal: Hemodynamically stable Outcome: Completed/Met Date Met:  12/02/14 Goal: Other Phase I Outcomes/Goals Outcome: Completed/Met Date Met:  12/02/14

## 2014-12-02 NOTE — Progress Notes (Addendum)
Patient ID: Rachel Copeland, female   DOB: [birth year], 80 y.o.   MRN: [id number] [facility] PROGRESS NOTE  Rachel Copeland:[id number] DOB: 13-Jan-1929 DOA: 12/01/2014 PCP: Elyn Peers, MD  Brief narrative:    80 y.o. female with history of diabetes mellitus, chronic diastolic CHF (no previous 2 D ECHO on file), hypertension, dyslipidemia, CKD stage 4 who presented to [facility] ED with progressively worsening shortness of breath with cough productive of yellowish sputum for past few days prior to this admission. Patient also had and episode of nausea and vomiting but this has now subsided. In ED, pt found to be hypoxic with O2 sat 68% on room air. She has improved with [place] oxygen support. CXR showed right upper lobe, right lower lobe and left lower lobe airspace disease most concerning for multilobar pneumonia. Additionally, her 12 lead EKG showed LBBB but no old EKG for comparison. No complaints of chest pain. Troponin levels were all negative total of 3 sets. She was given lasix 40 mg IV in ED and then continued of lasix 20 mg daily. 2 D ECHO from this admission is pending.    Assessment/Plan:    Principal Problem:   Acute respiratory failure with hypoxia / multilobar pneumonia / community acquired pneumonia - hypoxia secondary to multilobar pneumonia. CXR on admission showed right upper lobe, right lower lobe and left lower lobe airspace disease most concerning for multilobar pneumonia. - pt started on azithromycin and rocephin - continue duoneb every 4 hours PRN shortness of breath or wheezing - strep pneumo negative; legionella pending; blood and resp cultures pending    Active Problems:   Diabetes mellitus type 2, controlled - check A1c - on SSI - stopped actos as it may contribute to worsening CHF   HTN (hypertension) - continue Norvasc, coreg and lasix    Dyslipidemia - continue vytorin and omega 3   Chronic diastolic CHF (congestive heart failure) / LBBB on EKG - we  will repeat 12 lead EKG today - no complaints of chest pain; troponin x 3 WNL - BNP only mildly elevated at 576. Given lasix 40 mg IV once in ED. Now on lasix 20 mg daily PO. - continue coreg 250 mg PO BID- weight stable at 223 lbs   - will ask for cardio consult and their recommendations.  - continue aspirin daily  - continue to monitor daily weight and replete electrolytes as needed.   Hypokalemia - due to lasix - repleted    Anemia of chronic disease - secondary to history of CKD - hemoglobin is 10.2 - no current indications for transfusion    Thrombocytopenia - this is a chronic problem, unclear etiology - will continue to monitor  - use SCD's for DVT prophylaxis    Chronic kidney disease (CKD), stage IV (severe) - baseline creatinine in 2.4 range and on this admission 1.20 - 1.27. We temporarily held Diovan to see if kidney function improves. Please note that kidney function however better than baseline and pt was previously on this medication.   DVT Prophylaxis  - SCD's bilaterally   Code Status: Full.  Family Communication:  plan of care discussed with the patient and her son at the bedside  Disposition Plan: Home when stable.   IV access:   Peripheral IV  Procedures and diagnostic studies:    Dg Chest Portable 1 View 12/01/2014    Right upper lobe, right lower lobe and left lower lobe airspace disease most concerning for multilobar pneumonia.   Electronically  Signed   By: Kathreen Devoid   On: 12/01/2014 20:07   Medical Consultants:   None   Other Consultants:   PT/OT evaluation   IAnti-Infectives:    Azithromycin 12/01/2014 -->  Rocephin 12/01/2014 -->   Leisa Lenz, MD  [facility] Pager [phone]  If 7PM-7AM, please contact night-coverage [email or web address] Password [email or web address] 12/02/2014, 4:32 PM   LOS: 1 day    HPI/Subjective: No acute overnight events.  Objective: Filed Vitals:   12/01/14 2310 12/01/14 2318 12/02/14 0507 12/02/14 1509  BP: 165/56   141/55 147/58  Pulse: 75  67 66  Temp: 97.7 F (36.5 C)  98.2 F (36.8 C) 98.7 F (37.1 C)  TempSrc: Oral  Oral Oral  Resp: 20  20 18   Height:  5\' 4"  (1.626 m)    Weight:  101.5 kg (223 lb 12.3 oz) 101.6 kg (223 lb 15.8 oz)   SpO2: 97%  100% 97%    Intake/Output Summary (Last 24 hours) at 12/02/14 1632 Last data filed at 12/02/14 1554  Gross per 24 hour  Intake    720 ml  Output   1150 ml  Net   -430 ml    Exam:   General:  Pt is alert, follows commands appropriately, not in acute distress  Cardiovascular: Regular rate and rhythm, S1/S2 appreciate   Respiratory: diminished breath sounds bilaterally, some rhonchi in mid lung lobes   Abdomen: Soft, non tender, non distended, bowel sounds present  Extremities: No edema, pulses DP and PT palpable bilaterally  Neuro: Grossly nonfocal  Data Reviewed: Basic Metabolic Panel:  Recent Labs Lab 12/01/14 1927 12/01/14 2034 12/02/14 0520  NA 140 142 140  K 3.4* 3.3* 3.1*  CL 100 104 101  CO2 26  --  26  GLUCOSE 207* 209* 117*  BUN 22 23 23   CREATININE 1.10 1.20* 1.27*  CALCIUM 10.4  --  9.4   Liver Function Tests:  Recent Labs Lab 12/01/14 2202 12/02/14 0520  AST 25 18  ALT 16 12  ALKPHOS 60 49  BILITOT 0.4 0.3  PROT 8.2 6.8  ALBUMIN 3.9 3.1*    Recent Labs Lab 12/01/14 2202  LIPASE 14   No results for input(s): AMMONIA in the last 168 hours. CBC:  Recent Labs Lab 12/01/14 1927 12/01/14 2034 12/02/14 0520  WBC 6.5  --  5.8  NEUTROABS  --   --  4.1  HGB 11.5* 13.9 10.2*  HCT 35.4* 41.0 31.1*  MCV 80.6  --  81.0  PLT 116*  --  117*   Cardiac Enzymes:  Recent Labs Lab 12/01/14 1927 12/01/14 2355 12/02/14 0520  TROPONINI <0.30 <0.30 <0.30   BNP: Invalid input(s): POCBNP CBG:  Recent Labs Lab 12/02/14 0747 12/02/14 1201  GLUCAP 92 113*    No results found for this or any previous visit (from the past 240 hour(s)).   Scheduled Meds: . amLODipine  10 mg Oral Daily  . aspirin   81 mg Oral Daily  . azithromycin  500 mg Intravenous Q24H  . carvedilol  25 mg Oral BID WC  . cefTRIAXone (ROCEPHIN)  IV  1 g Intravenous Q24H  . enoxaparin (LOVENOX) injection  40 mg Subcutaneous QHS  . ezetimibe-simvastatin  1 tablet Oral QHS  . furosemide  20 mg Oral q morning - 10a  . irbesartan  300 mg Oral Daily   And  . hydrochlorothiazide  25 mg Oral Daily  . insulin aspart  0-9 Units Subcutaneous TID WC  . omega-3  acid ethyl esters  1 g Oral q morning - 10a   Continuous Infusions:

## 2014-12-02 NOTE — Plan of Care (Signed)
Problem: Consults Goal: Heart Failure Patient Education (See Patient Education module for education specifics.) Outcome: Completed/Met Date Met:  12/02/14  Problem: Phase I Progression Outcomes Goal: EF % per last Echo/documented,Core Reminder form on chart Outcome: Completed/Met Date Met:  12/02/14

## 2014-12-03 ENCOUNTER — Inpatient Hospital Stay (HOSPITAL_COMMUNITY): Payer: PRIVATE HEALTH INSURANCE

## 2014-12-03 ENCOUNTER — Other Ambulatory Visit: Payer: Self-pay

## 2014-12-03 DIAGNOSIS — I5033 Acute on chronic diastolic (congestive) heart failure: Principal | ICD-10-CM

## 2014-12-03 LAB — BASIC METABOLIC PANEL
Anion gap: 10 (ref 5–15)
BUN: 26 mg/dL — ABNORMAL HIGH (ref 6–23)
CO2: 27 meq/L (ref 19–32)
Calcium: 9.1 mg/dL (ref 8.4–10.5)
Chloride: 103 mEq/L (ref 96–112)
Creatinine, Ser: 1.3 mg/dL — ABNORMAL HIGH (ref 0.50–1.10)
GFR calc Af Amer: 43 mL/min — ABNORMAL LOW (ref >90)
GFR calc non Af Amer: 37 mL/min — ABNORMAL LOW (ref >90)
GLUCOSE: 108 mg/dL — AB (ref 70–99)
Potassium: 3.3 mEq/L — ABNORMAL LOW (ref 3.7–5.3)
Sodium: 140 mEq/L (ref 137–147)

## 2014-12-03 LAB — HEMOGLOBIN A1C
HEMOGLOBIN A1C: 7.9 % — AB (ref <5.7)
MEAN PLASMA GLUCOSE: 180 mg/dL — AB (ref <117)

## 2014-12-03 LAB — GLUCOSE, CAPILLARY
GLUCOSE-CAPILLARY: 229 mg/dL — AB (ref 70–99)
GLUCOSE-CAPILLARY: 112 mg/dL — AB (ref 70–99)
GLUCOSE-CAPILLARY: 255 mg/dL — AB (ref 70–99)
Glucose-Capillary: 95 mg/dL (ref 70–99)

## 2014-12-03 LAB — CBC
HCT: 30.5 % — ABNORMAL LOW (ref 36.0–46.0)
HEMOGLOBIN: 9.7 g/dL — AB (ref 12.0–15.0)
MCH: 26 pg (ref 26.0–34.0)
MCHC: 31.8 g/dL (ref 30.0–36.0)
MCV: 81.8 fL (ref 78.0–100.0)
Platelets: 100 10*3/uL — ABNORMAL LOW (ref 150–400)
RBC: 3.73 MIL/uL — AB (ref 3.87–5.11)
RDW: 16.8 % — ABNORMAL HIGH (ref 11.5–15.5)
WBC: 4.2 10*3/uL (ref 4.0–10.5)

## 2014-12-03 LAB — LEGIONELLA ANTIGEN, URINE

## 2014-12-03 MED ORDER — FUROSEMIDE 40 MG PO TABS
40.0000 mg | ORAL_TABLET | Freq: Every morning | ORAL | Status: DC
  Administered 2014-12-04: 40 mg via ORAL
  Filled 2014-12-03: qty 1

## 2014-12-03 MED ORDER — POTASSIUM CHLORIDE CRYS ER 20 MEQ PO TBCR
40.0000 meq | EXTENDED_RELEASE_TABLET | Freq: Once | ORAL | Status: AC
  Administered 2014-12-03: 40 meq via ORAL
  Filled 2014-12-03: qty 2

## 2014-12-03 NOTE — Evaluation (Signed)
Physical Therapy Evaluation Patient Details Name: Rachel Copeland MRN: [id number] DOB: 1929-10-25 Today's Date: 12/03/2014   History of Present Illness  80 yo female admitted 12/01/14 with wEAKNESS, sob, FOUND TO HAVE PNEUMONIA. h/o dm, chf, htn.  Clinical Impression  Patient  Ambulated x 150' on RA, sats 94%, not SOB. Patient will benefit from PT to address problems listed in note below.    Follow Up Recommendations Home health PT;Supervision - Intermittent    Equipment Recommendations  None recommended by PT    Recommendations for Other Services       Precautions / Restrictions Precautions Precautions: Fall Precaution Comments: MONITOR SATS      Mobility  Bed Mobility Overal bed mobility: Independent                Transfers Overall transfer level: Needs assistance Equipment used: None Transfers: Sit to/from [employer] Transfers Sit to Stand: Supervision Stand pivot transfers: Supervision       General transfer comment: cues for safety  Ambulation/Gait Ambulation/Gait assistance: Min guard Ambulation Distance (Feet): 150 Feet Assistive device: Rolling walker (2 wheeled);None Gait Pattern/deviations: Step-through pattern Gait velocity: wfl   General Gait Details: patient  ambulated in room without need for  AD, ambulated x 100' of 150' with RW, then ambulated x 50' without UE support.  Stairs            Wheelchair Mobility    Modified Rankin (Stroke Patients Only)       Balance Overall balance assessment: Needs assistance Sitting-balance support: Feet supported Sitting balance-Leahy Scale: Normal     Standing balance support: During functional activity;No upper extremity supported Standing balance-Leahy Scale: Good                               Pertinent Vitals/Pain Pain Assessment: No/denies pain    Home Living Family/patient expects to be discharged to:: Private residence Living Arrangements:  Alone Available Help at Discharge: Family;Available PRN/intermittently Type of Home: Apartment (for elderly) Home Access: Level entry;Elevator     Home Layout: One level Home Equipment: Walker - 2 wheels      Prior Function Level of Independence: Independent         Comments: does not drive     Hand Dominance        Extremity/Trunk Assessment               Lower Extremity Assessment: Generalized weakness      Cervical / Trunk Assessment: Normal  Communication   Communication: No difficulties  Cognition Arousal/Alertness: Awake/alert Behavior During Therapy: WFL for tasks assessed/performed Overall Cognitive Status: Within Functional Limits for tasks assessed                      General Comments      Exercises        Assessment/Plan    PT Assessment Patient needs continued PT services  PT Diagnosis Generalized weakness   PT Problem List Decreased strength;Decreased mobility;Decreased knowledge of precautions;Decreased safety awareness;Decreased knowledge of use of DME  PT Treatment Interventions DME instruction;Functional mobility training;Therapeutic activities;Therapeutic exercise;Patient/family education   PT Goals (Current goals can be found in the Care Plan section) Acute Rehab PT Goals Patient Stated Goal: to go back home PT Goal Formulation: With patient Time For Goal Achievement: 12/17/14 Potential to Achieve Goals: Good    Frequency Min 3X/week   Barriers to discharge  Co-evaluation               End of Session   Activity Tolerance: Patient tolerated treatment well Patient left: in chair;with call bell/phone within reach;with chair alarm set Nurse Communication: Mobility status         Time: 6168-3729 PT Time Calculation (min) (ACUTE ONLY): 23 min   Charges:   PT Evaluation $Initial PT Evaluation Tier I: 1 Procedure PT Treatments $Gait Training: 8-22 mins $Self Care/Home Management: 8-22   PT G  Codes:          Claretha Cooper 12/03/2014, 11:02 AM Tresa Endo PT [phone]

## 2014-12-03 NOTE — Plan of Care (Signed)
Problem: Phase I Progression Outcomes Goal: Initial discharge plan identified Outcome: Completed/Met Date Met:  12/03/14  Problem: Phase II Progression Outcomes Goal: Encourage coughing & deep breathing Outcome: Completed/Met Date Met:  12/03/14 Goal: Wean O2 if indicated Outcome: Completed/Met Date Met:  12/03/14 Goal: Pain controlled Outcome: Completed/Met Date Met:  12/03/14 Goal: Progress activity as tolerated unless otherwise ordered Outcome: Completed/Met Date Met:  12/03/14 Goal: Discharge plan established Outcome: Completed/Met Date Met:  12/03/14 Goal: Tolerating diet Outcome: Completed/Met Date Met:  12/03/14

## 2014-12-03 NOTE — Progress Notes (Signed)
Inpatient Diabetes Program Recommendations  AACE/ADA: New Consensus Statement on Inpatient Glycemic Control (2013)  Target Ranges:  Prepandial:   less than 140 mg/dL      Peak postprandial:   less than 180 mg/dL (1-2 hours)      Critically ill patients:  140 - 180 mg/dL     Results for Rachel Copeland, Rachel Copeland (MRN [id number]) as of 12/03/2014 09:06  Ref. Range 12/02/2014 07:47 12/02/2014 12:01 12/02/2014 17:05 12/02/2014 22:06  Glucose-Capillary Latest Range: 70-99 mg/dL 92 113 (H) 159 (H) 177 (H)     Admitted with Pneumonia.  History of DM, HTN, CHF.   Home DM Meds: Actos 45 mg daily   Current Insulin Orders: Novolog Sensitive SSI    **Note patient taking Actos at home.  This could be contributing to her worsening symptoms of CHF as Actos has a side effect of fluid retention.  **Note patient is 80 years old and needs a safe DM medication that will not place her at risk for hypoglycemia.    MD- Recommend we switch patient to Tradjenta 5 mg once daily in the AM for home use and discontinue Actos from home regimen  Tradjenta is a DPP-4 inhibitor drug that works in a glucose dependent manner (works more when glucose levels are elevated, works less when glucose levels are lower)  Tradjenta does not need any dose adjustment for renal disease or age.     Will follow Wyn Quaker RN, MSN, CDE Diabetes Coordinator Inpatient Diabetes Program Team Pager: [phone] (8a-10p)

## 2014-12-03 NOTE — Progress Notes (Addendum)
Patient ID: Rachel Copeland, female   DOB: 1929/10/08, 80 y.o.   MRN: [id number] [facility] PROGRESS NOTE  Rachel Copeland GMW:[id number] DOB: Aug 10, 1929 DOA: 12/01/2014 PCP: Elyn Peers, MD  Brief narrative:    80 y.o. female with history of diabetes mellitus, chronic diastolic CHF (no previous 2 D ECHO on file), hypertension, dyslipidemia, CKD stage 4 who presented to [facility] ED with progressively worsening shortness of breath with cough productive of yellowish sputum for past few days prior to this admission. Patient also had and episode of nausea and vomiting but this has now subsided. In ED, pt found to be hypoxic with O2 sat 68% on room air. She has improved with [place] oxygen support. CXR showed right upper lobe, right lower lobe and left lower lobe airspace disease most concerning for multilobar pneumonia. Additionally, her 12 lead EKG showed LBBB but no old EKG for comparison. No complaints of chest pain. Troponin levels were all negative total of 3 sets. She was given lasix 40 mg IV in ED and then continued of lasix 20 mg daily.   Assessment/Plan:     Principal Problem:  Acute respiratory failure with hypoxia / multilobar pneumonia / community acquired pneumonia - hypoxia secondary to multilobar pneumonia. CXR on admission showed right upper lobe, right lower lobe and left lower lobe airspace disease most concerning for multilobar pneumonia. - pt started on azithromycin and rocephin patient feels better this morning. We repeated chest x-ray which shows improvement in pneumonia - Strep pneumoniae is negative, influenza is negative and blood cultures so far negative. Legionella is pending.  - respiratory status stable   Active Problems:  Diabetes mellitus type 2, controlled - A1c on this admission is 7.9 slightly above the goal A1c for diabetics.  - we stopped Actos because it could contribute to worsening congestive heart failure. - Patient is currently on sliding scale  insulin.   HTN (hypertension) - continue Norvasc, coreg and lasix    Dyslipidemia - continue vytorin and omega 3   Chronic diastolic CHF (congestive heart failure) / LBBB on EKG - Repeat 12-lead EKG with left bundle branch block. No complaints of chest pain. Troponin levels within normal limits.  - BNP only mildly elevated at 576. Given lasix 40 mg IV once in ED. Now on lasix 20 mg daily PO. - Appreciate cardiology consult and recommendation.  - for now continue coreg, lasix. Diovan on hold due to renal insufficiency.  - Continue an aspirin daily - Replete electrolytes as needed.   Hypokalemia - due to lasix - repleted    Anemia of chronic disease - secondary to history of CKD - hemoglobin is 10.2 - no current indications for transfusion    Thrombocytopenia - this is a chronic problem, unclear etiology - will continue to monitor  - use SCD's for DVT prophylaxis    Chronic kidney disease (CKD), stage IV (severe) - baseline creatinine in 2.4 range and on this admission 1.20 - 1.27. We temporarily held Diovan to see if kidney function improves. Please note that kidney function however better than baseline and pt was previously on this medication.   DVT Prophylaxis  - SCD's bilaterally   Code Status: Full.  Family Communication: plan of care discussed with the patient Disposition Plan: Home when stable.    IV access:   Peripheral IV  Procedures and diagnostic studies:    Dg Chest Port 1 View 05-Dec-2014  mprovement in aeration. No residual infiltrate or pulmonary edema. Persistent mild right hilar and  right paratracheal prominence probable vascular in nature.     Dg Chest Portable 1 View 12/01/2014 Right upper lobe, right lower lobe and left lower lobe airspace disease most concerning for multilobar pneumonia.   Medical Consultants:   Cardiology   Other Consultants:   PT/OT   IAnti-Infectives:    Azithromycin 12/01/2014 -->  Rocephin  12/01/2014 -->   Leisa Lenz, MD  [facility] Pager [phone]  If 7PM-7AM, please contact night-coverage [email or web address] Password [email or web address] 12/03/2014, 11:27 AM   LOS: 2 days    HPI/Subjective: No acute overnight events.  Objective: Filed Vitals:   12/02/14 0507 12/02/14 1509 12/02/14 2200 12/03/14 0552  BP: 141/55 147/58 117/45 149/51  Pulse: 67 66 65 65  Temp: 98.2 F (36.8 C) 98.7 F (37.1 C) 98.1 F (36.7 C) 98.5 F (36.9 C)  TempSrc: Oral Oral Oral Oral  Resp: 20 18 18 18   Height:      Weight: 101.6 kg (223 lb 15.8 oz)   102.5 kg (225 lb 15.5 oz)  SpO2: 100% 97% 95% 98%    Intake/Output Summary (Last 24 hours) at 12/03/14 1127 Last data filed at 12/03/14 0700  Gross per 24 hour  Intake    780 ml  Output   2000 ml  Net  -1220 ml    Exam:   General:  Pt is alert, follows commands appropriately, not in acute distress  Cardiovascular: Regular rate and rhythm, S1/S2 appreciated  Respiratory: Diminished but no wheezing  Abdomen: Soft, non tender, non distended, bowel sounds present  Extremities: pulses DP and PT palpable bilaterally  Neuro: Grossly nonfocal  Data Reviewed: Basic Metabolic Panel:  Recent Labs Lab 12/01/14 1927 12/01/14 2034 12/02/14 0520 12/03/14 0400  NA 140 142 140 140  K 3.4* 3.3* 3.1* 3.3*  CL 100 104 101 103  CO2 26  --  26 27  GLUCOSE 207* 209* 117* 108*  BUN 22 23 23  26*  CREATININE 1.10 1.20* 1.27* 1.30*  CALCIUM 10.4  --  9.4 9.1   Liver Function Tests:  Recent Labs Lab 12/01/14 2202 12/02/14 0520  AST 25 18  ALT 16 12  ALKPHOS 60 49  BILITOT 0.4 0.3  PROT 8.2 6.8  ALBUMIN 3.9 3.1*    Recent Labs Lab 12/01/14 2202  LIPASE 14   No results for input(s): AMMONIA in the last 168 hours. CBC:  Recent Labs Lab 12/01/14 1927 12/01/14 2034 12/02/14 0520 12/03/14 0400  WBC 6.5  --  5.8 4.2  NEUTROABS  --   --  4.1  --   HGB 11.5* 13.9 10.2* 9.7*  HCT 35.4* 41.0 31.1* 30.5*  MCV 80.6  --  81.0 81.8   PLT 116*  --  117* 100*   Cardiac Enzymes:  Recent Labs Lab 12/01/14 1927 12/01/14 2355 12/02/14 0520  TROPONINI <0.30 <0.30 <0.30   BNP: Invalid input(s): POCBNP CBG:  Recent Labs Lab 12/02/14 0747 12/02/14 1201 12/02/14 1705 12/02/14 2206 12/03/14 0748  GLUCAP 92 113* 159* 177* 95    Recent Results (from the past 240 hour(s))  Culture, blood (routine x 2)     Status: None (Preliminary result)   Collection Time: 12/01/14  8:40 PM  Result Value Ref Range Status   Specimen Description BLOOD RIGHT HAND  Final   Special Requests BOTTLES DRAWN AEROBIC AND ANAEROBIC 3ML  Final   Culture  Setup Time   Final    12/02/2014 00:57 Performed at [place]   Final  BLOOD CULTURE RECEIVED NO GROWTH TO DATE CULTURE WILL BE HELD FOR 5 DAYS BEFORE ISSUING A FINAL NEGATIVE REPORT Performed at [facility]    Report Status PENDING  Incomplete  Culture, blood (routine x 2)     Status: None (Preliminary result)   Collection Time: 12/01/14  8:40 PM  Result Value Ref Range Status   Specimen Description BLOOD RIGHT WRIST  Final   Special Requests BOTTLES DRAWN AEROBIC ONLY 3ML  Final   Culture  Setup Time   Final    12/02/2014 00:57 Performed at [facility]    Culture   Final           BLOOD CULTURE RECEIVED NO GROWTH TO DATE CULTURE WILL BE HELD FOR 5 DAYS BEFORE ISSUING A FINAL NEGATIVE REPORT Performed at [facility]    Report Status PENDING  Incomplete     Scheduled Meds: . amLODipine  10 mg Oral Daily  . aspirin  81 mg Oral Daily  . azithromycin  500 mg Intravenous Q24H  . carvedilol  25 mg Oral BID WC  . cefTRIAXone (ROCEPHIN)  IV  1 g Intravenous Q24H  . ezetimibe-simvastatin  1 tablet Oral QHS  . furosemide  20 mg Oral q morning - 10a  . insulin aspart  0-9 Units Subcutaneous TID WC  . omega-3 acid ethyl esters  1 g Oral q morning - 10a

## 2014-12-03 NOTE — Consult Note (Signed)
Admit date: 12/01/2014 Referring Physician  Dr. Charlies Silvers Primary Physician Elyn Peers, MD Primary Cardiologist  None Reason for Consultation  acute on chronic diastolic heart failure  HPI: 80 year old female admitted with pneumonia with shortness of breath, productive cough, oxygen saturation of 68% on room air, hypoxic, left bundle branch block with presumably a component of acute diastolic heart failure. Also has comorbidities of diabetes, hypertension, hyperlipidemia. Chest x-ray demonstrates multi lobar pneumonia, right upper lobe, right lower lobe, left lower lobe. She is on both azithromycin and ceftriaxone. Her weight has been stable at 223 pounds and she is currently on carvedilol 25 mg twice a day. She has been given occasional Lasix IV, 40 mg IV once in emergency department, now on 20 mg by mouth Lasix. We were consulted for our recommendations.  Currently feels better. Less SOB. No CP.   Echocardiogram performed on 12/02/14 showed hyperdynamic left ventricle with ejection fraction of 16% and diastolic dysfunction, grade 1. There is also mild pulmonary hypertension estimated 32 mmHg.  Troponins have been normal, creatinine has ranged from 1.2-1.3. Hemoglobin has decreased from admission from 13.9-9.7. ProBNP was 576 on admission, up from 104 previously drawn in 2010.     PMH:   Past Medical History  Diagnosis Date  . Diabetes mellitus   . Hypertension   . Hyperlipidemia     PSH:  History reviewed. No pertinent past surgical history. Allergies:  Sitagliptin phosphate Prior to Admit Meds:   Prior to Admission medications   Medication Sig Start Date End Date Taking? Authorizing Provider  amLODipine (NORVASC) 10 MG tablet Take 10 mg by mouth daily.   Yes Historical Provider, MD  Ascorbic Acid (VITAMIN C) 1000 MG tablet Take 1,000 mg by mouth every morning.   Yes Historical Provider, MD  aspirin 81 MG tablet Take 81 mg by mouth daily.   Yes Historical Provider, MD    calcium-vitamin D (OSCAL WITH D) 500-200 MG-UNIT per tablet Take 1 tablet by mouth every morning.   Yes Historical Provider, MD  carvedilol (COREG) 25 MG tablet Take 25 mg by mouth 2 (two) times daily with a meal.   Yes Historical Provider, MD  ezetimibe-simvastatin (VYTORIN) 10-20 MG per tablet Take 1 tablet by mouth at bedtime.   Yes Historical Provider, MD  fish oil-omega-3 fatty acids 1000 MG capsule Take 1 g by mouth every morning.   Yes Historical Provider, MD  furosemide (LASIX) 20 MG tablet Take 20 mg by mouth every morning.   Yes Historical Provider, MD  pioglitazone (ACTOS) 45 MG tablet Take 45 mg by mouth every morning.   Yes Historical Provider, MD  valsartan-hydrochlorothiazide (DIOVAN-HCT) 320-25 MG per tablet Take 1 tablet by mouth daily.   Yes Historical Provider, MD   Fam HX:    Family History  Problem Relation Age of Onset  . Colon cancer Mother   . Diabetes Mellitus II Brother   . Diabetes Mellitus II Daughter    Social HX:    History   Social History  . Marital Status: Widowed    Spouse Name: N/A    Number of Children: N/A  . Years of Education: N/A   Occupational History  . Not on file.   Social History Main Topics  . Smoking status: Never Smoker   . Smokeless tobacco: Not on file  . Alcohol Use: No  . Drug Use: No  . Sexual Activity: Not on file   Other Topics Concern  . Not on file   Social  History Narrative     ROS:  No CP, no syncope, no bleeding, no rash All 11 ROS were addressed and are negative except what is stated in the HPI   Physical Exam: Blood pressure 149/51, pulse 65, temperature 98.5 F (36.9 C), temperature source Oral, resp. rate 18, height 5\' 4"  (1.626 m), weight 225 lb 15.5 oz (102.5 kg), SpO2 98 %.   General: Elderly, in no acute distress Head: Eyes PERRLA, No xanthomas.   Normal cephalic and atramatic  Lungs:   Scattered rhonchi  Heart:   HRRR S1 S2 Pulses are 2+ & equal. 2/6 S murmur, rubs, gallops.  No carotid bruit. No  JVD.  No abdominal bruits.  Abdomen: Bowel sounds are positive, abdomen soft and non-tender without masses. No hepatosplenomegaly. Msk:  Back normal. Normal strength and tone for age. Extremities:  No clubbing, cyanosis or edema.  DP +1 Neuro: Alert and oriented X 3, non-focal, MAE x 4 GU: Deferred Rectal: Deferred Psych:  Good affect, responds appropriately      Labs: Lab Results  Component Value Date   WBC 4.2 12/03/2014   HGB 9.7* 12/03/2014   HCT 30.5* 12/03/2014   MCV 81.8 12/03/2014   PLT 100* 12/03/2014     Recent Labs Lab 12/02/14 0520 12/03/14 0400  NA 140 140  K 3.1* 3.3*  CL 101 103  CO2 26 27  BUN 23 26*  CREATININE 1.27* 1.30*  CALCIUM 9.4 9.1  PROT 6.8  --   BILITOT 0.3  --   ALKPHOS 49  --   ALT 12  --   AST 18  --   GLUCOSE 117* 108*    Recent Labs  12/01/14 1927 12/01/14 2355 12/02/14 0520  TROPONINI <0.30 <0.30 <0.30   Lab Results  Component Value Date   CHOL 171 05/21/2009   HDL 78 05/21/2009   LDLCALC 81 05/21/2009   TRIG 60 05/21/2009   No results found for: DDIMER   Radiology:  Dg Chest Port 1 View  12/03/2014   CLINICAL DATA:  Followup pneumonia  EXAM: PORTABLE CHEST - 1 VIEW  COMPARISON:  12/01/2014  FINDINGS: Cardiomegaly again noted. There is improvement in aeration. Previous right infiltrates has cleared. Persistent mild right hilar and right paratracheal prominence probable vascular in nature. No pulmonary edema  IMPRESSION: Improvement in aeration. No residual infiltrate or pulmonary edema. Persistent mild right hilar and right paratracheal prominence probable vascular in nature.   Electronically Signed   By: Lahoma Crocker M.D.   On: 12/03/2014 10:49   Dg Chest Portable 1 View  12/01/2014   CLINICAL DATA:  Abdominal pain, nauseated, diarrhea for 1 day, no chest pain  EXAM: PORTABLE CHEST - 1 VIEW  COMPARISON:  10/03/2009  FINDINGS: Right upper lobe, right lower lobe and left lower lobe airspace disease. Bilateral mild  interstitial thickening. No pleural effusion or pneumothorax. Stable cardiomediastinal silhouette. No acute osseous abnormality.  IMPRESSION: Right upper lobe, right lower lobe and left lower lobe airspace disease most concerning for multilobar pneumonia.   Electronically Signed   By: Kathreen Devoid   On: 12/01/2014 20:07   Personally viewed.  EKG:  Sinus rhythm, left bundle branch block, similar to prior EKG Personally viewed.  Echocardiogram as described above, hyperdynamic left ventricle.  ASSESSMENT/PLAN:    80 year old female with multilobar pneumonia, hypoxia on antibiotics with history of chronic diastolic heart failure, preserved ejection fraction 70% with left bundle branch block on ECG, normal troponin, elevated BNP, possible component of acute on  chronic diastolic failure.  1. Acute on chronic diastolic heart failure-it is not unreasonable to empirically continue with Lasix, increasing it slightly to 40 mg once a day with close monitoring of her BUN and creatinine. Her hypoxia is likely being driven mostly by her multilobar pneumonia as seen on chest x-ray. Temperature however has been normal, white count normal. Paradoxical picture.  2. Pneumonia-per primary team, antibiotics as described above.  3. Essential hypertension-continue with current medication strategy. I'm fine with her continuing with carvedilol in the setting of left bundle branch block. Watch for any signs of worsening conduction however, pauses on telemetry  4. Left bundle branch block-likely secondary to aging conduction system. No evidence of acute MI. Troponin is normal.  5. Hypokalemia - replete per primary team.   Candee Furbish, MD  12/03/2014  11:07 AM

## 2014-12-03 NOTE — Evaluation (Signed)
Occupational Therapy Evaluation Patient Details Name: Rachel Copeland MRN: [id number] DOB: 12-05-1929 Today's Date: 12/03/2014    History of Present Illness 80 yo female admitted 12/01/14 with wEAKNESS, sob, FOUND TO HAVE PNEUMONIA. h/o dm, chf, htn.   Clinical Impression   Patient evaluated by Occupational Therapy with no further acute OT needs identified. All education has been completed and the patient has no further questions. Pt able to perform simulated ADLs with supervision - no LOB and sats 98% on RA.   Anticipate she will progress quickly to modified independence.  Instructed pt to pace self at home and to take rest breaks as needed.  No skilled OT needs identified.  See below for any follow-up Occupational Therapy or equipment needs. OT is signing off. Thank you for this referral.      Follow Up Recommendations  No OT follow up;Supervision - Intermittent    Equipment Recommendations  None recommended by OT    Recommendations for Other Services       Precautions / Restrictions Precautions Precautions: Fall Precaution Comments: MONITOR SATS      Mobility Bed Mobility Overal bed mobility: Independent                Transfers Overall transfer level: Modified independent Equipment used: None Transfers: Sit to/from [facility] Sit to Stand: Supervision Stand pivot transfers: Supervision       General transfer comment: cues for safety    Balance Overall balance assessment: Needs assistance Sitting-balance support: Feet supported Sitting balance-Leahy Scale: Normal     Standing balance support: During functional activity Standing balance-Leahy Scale: Good Standing balance comment: simulated shower in standing                             ADL Overall ADL's : Needs assistance/impaired Eating/Feeding: Independent   Grooming: Supervision/safety;Sitting   Upper Body Bathing: Supervision/ safety;Standing   Lower Body  Bathing: Supervison/ safety;Sit to/from stand   Upper Body Dressing : Set up;Sitting   Lower Body Dressing: Supervision/safety;Sit to/from stand   Toilet Transfer: Supervision/safety;Ambulation;Comfort height toilet   Toileting- Clothing Manipulation and Hygiene: Supervision/safety   Tub/ Shower Transfer: Supervision/safety;Ambulation   Functional mobility during ADLs: Supervision/safety General ADL Comments: Pt able to simulate stepping over tub, and simulate shower in standing without LOB and dyspnea 2/4, 02 sats 98% on RA.  Instructed pt to pace self at home, and take rest breaks if/when she feels fatigued.  She verbalized understanding      Vision                     Perception     Praxis      Pertinent Vitals/Pain Pain Assessment: No/denies pain     Hand Dominance Right   Extremity/Trunk Assessment Upper Extremity Assessment Upper Extremity Assessment: Overall WFL for tasks assessed   Lower Extremity Assessment Lower Extremity Assessment: Defer to PT evaluation   Cervical / Trunk Assessment Cervical / Trunk Assessment: Normal   Communication Communication Communication: No difficulties   Cognition Arousal/Alertness: Awake/alert Behavior During Therapy: WFL for tasks assessed/performed Overall Cognitive Status: Within Functional Limits for tasks assessed                     General Comments       Exercises       Shoulder Instructions      Home Living Family/patient expects to be discharged to:: Private residence Living Arrangements:  Alone Available Help at Discharge: Family;Available PRN/intermittently Type of Home: Apartment (for elderly) Home Access: Level entry;Elevator     Home Layout: One level     Bathroom Shower/Tub: Tub/shower unit;Curtain Shower/tub characteristics: Architectural technologist: Standard     Home Equipment: Walker - 2 wheels;Grab bars - toilet;Grab bars - tub/shower          Prior  Functioning/Environment Level of Independence: Independent        Comments: does not drive.  cooks, cleans and grocery shops independently     OT Diagnosis:     OT Problem List:     OT Treatment/Interventions:      OT Goals(Current goals can be found in the care plan section) Acute Rehab OT Goals Patient Stated Goal: to go back home OT Goal Formulation: All assessment and education complete, DC therapy  OT Frequency:     Barriers to D/C:            Co-evaluation              End of Session Nurse Communication: Mobility status  Activity Tolerance: Patient tolerated treatment well Patient left: in chair;with call bell/phone within reach;with chair alarm set   Time: 0998-3382 OT Time Calculation (min): 15 min Charges:  OT General Charges $OT Visit: 1 Procedure OT Evaluation $Initial OT Evaluation Tier I: 1 Procedure OT Treatments $Self Care/Home Management : 8-22 mins G-Codes:    Raydell Maners M 12-22-14, 11:09 AM

## 2014-12-03 NOTE — Plan of Care (Signed)
Problem: Phase I Progression Outcomes Goal: Dyspnea controlled at rest Outcome: Completed/Met Date Met:  12/03/14 Goal: Pain controlled with appropriate interventions Outcome: Completed/Met Date Met:  12/03/14 Goal: Confirm chest x-ray completed Outcome: Completed/Met Date Met:  12/03/14 Goal: Code status addressed with pt/family Outcome: Completed/Met Date Met:  12/03/14

## 2014-12-04 DIAGNOSIS — I5031 Acute diastolic (congestive) heart failure: Secondary | ICD-10-CM

## 2014-12-04 LAB — BASIC METABOLIC PANEL
Anion gap: 10 (ref 5–15)
BUN: 28 mg/dL — AB (ref 6–23)
CO2: 25 meq/L (ref 19–32)
CREATININE: 1.18 mg/dL — AB (ref 0.50–1.10)
Calcium: 9.2 mg/dL (ref 8.4–10.5)
Chloride: 103 mEq/L (ref 96–112)
GFR calc non Af Amer: 41 mL/min — ABNORMAL LOW (ref >90)
GFR, EST AFRICAN AMERICAN: 48 mL/min — AB (ref >90)
Glucose, Bld: 93 mg/dL (ref 70–99)
Potassium: 3.7 mEq/L (ref 3.7–5.3)
Sodium: 138 mEq/L (ref 137–147)

## 2014-12-04 LAB — GLUCOSE, CAPILLARY
Glucose-Capillary: 106 mg/dL — ABNORMAL HIGH (ref 70–99)
Glucose-Capillary: 192 mg/dL — ABNORMAL HIGH (ref 70–99)
Glucose-Capillary: 94 mg/dL (ref 70–99)

## 2014-12-04 MED ORDER — FUROSEMIDE 10 MG/ML IJ SOLN
40.0000 mg | Freq: Every day | INTRAMUSCULAR | Status: DC
  Administered 2014-12-04 – 2014-12-06 (×3): 40 mg via INTRAVENOUS
  Filled 2014-12-04 (×3): qty 4

## 2014-12-04 NOTE — Progress Notes (Addendum)
Inpatient Diabetes Program Recommendations  AACE/ADA: New Consensus Statement on Inpatient Glycemic Control (2013)  Target Ranges:  Prepandial:   less than 140 mg/dL      Peak postprandial:   less than 180 mg/dL (1-2 hours)      Critically ill patients:  140 - 180 mg/dL    Results for SAYLA, GOLONKA (MRN [id number]) as of 12/04/2014 08:29  Ref. Range 12/03/2014 07:48 12/03/2014 12:01 12/03/2014 16:42 12/03/2014 22:24  Glucose-Capillary Latest Range: 70-99 mg/dL 95 229 (H) 112 (H) 255 (H)    Results for DESHAYLA, EMPSON (MRN [id number]) as of 12/04/2014 08:29  Ref. Range 12/04/2014 07:54  Glucose-Capillary Latest Range: 70-99 mg/dL 106 (H)    Admitted with Pneumonia. History of DM, HTN, CHF.   Home DM Meds: Actos 45 mg daily   Current Insulin Orders: Novolog Sensitive SSI    **Note patient taking Actos at home. This could be contributing to her worsening symptoms of CHF as Actos has a side effect of fluid retention.  **Note patient is 80 years old and needs a safe DM medication that will not place her at risk for hypoglycemia.    MD- Recommend we switch patient to Tradjenta 5 mg once daily in the AM for home use and discontinue Actos from home regimen  Tradjenta is a DPP-4 inhibitor drug that works in a glucose dependent manner (works more when glucose levels are elevated, works less when glucose levels are lower)  Tradjenta does not need any dose adjustment for renal disease or age.  Could start [place] here in hospital- On the formulary here at [facility]    Will follow Wyn Quaker RN, MSN, CDE Diabetes Coordinator Inpatient Diabetes Program Team Pager: [phone] (8a-10p)

## 2014-12-04 NOTE — Progress Notes (Signed)
Patient ID: Rachel Copeland, female   DOB: [birth year], 80 y.o.   MRN: [id number]    Patient Name: Rachel Copeland Date of Encounter: 12/04/2014     Principal Problem:   Acute respiratory failure with hypoxia Active Problems:   Diabetes mellitus type 2, controlled   CAP (community acquired pneumonia)   HTN (hypertension)   Dyslipidemia   Chronic diastolic CHF (congestive heart failure)   Anemia of chronic disease   Thrombocytopenia   Chronic kidney disease (CKD), stage IV (severe)    SUBJECTIVE  " Am I going home today?" Denies chest pain or sob.   CURRENT MEDS . amLODipine  10 mg Oral Daily  . antiseptic oral rinse  7 mL Mouth Rinse BID  . aspirin  81 mg Oral Daily  . azithromycin  500 mg Intravenous Q24H  . carvedilol  25 mg Oral BID WC  . cefTRIAXone (ROCEPHIN)  IV  1 g Intravenous Q24H  . ezetimibe-simvastatin  1 tablet Oral QHS  . furosemide  40 mg Oral q morning - 10a  . insulin aspart  0-9 Units Subcutaneous TID WC  . omega-3 acid ethyl esters  1 g Oral q morning - 10a  . sodium chloride  3 mL Intravenous Q12H  . sodium chloride  3 mL Intravenous Q12H    OBJECTIVE  Filed Vitals:   12/03/14 0552 12/03/14 1343 12/03/14 2219 12/04/14 0418  BP: 149/51 129/49 128/41 166/45  Pulse: 65 65 63 64  Temp: 98.5 F (36.9 C) 97.7 F (36.5 C) 97.8 F (36.6 C) 98.3 F (36.8 C)  TempSrc: Oral Oral Oral Oral  Resp: 18 18 18 20   Height:      Weight: 225 lb 15.5 oz (102.5 kg)   226 lb 6.6 oz (102.7 kg)  SpO2: 98% 100% 95% 100%    Intake/Output Summary (Last 24 hours) at 12/04/14 2355 Last data filed at 12/04/14 0700  Gross per 24 hour  Intake   1020 ml  Output    700 ml  Net    320 ml   Filed Weights   12/02/14 0507 12/03/14 0552 12/04/14 0418  Weight: 223 lb 15.8 oz (101.6 kg) 225 lb 15.5 oz (102.5 kg) 226 lb 6.6 oz (102.7 kg)    PHYSICAL EXAM  General: Pleasant, obese elderly woman, looks younger than stated age, NAD. Neuro: Alert and oriented X 3.  Moves all extremities spontaneously. Psych: Normal affect. HEENT:  Normal  Neck: Supple without bruits or JVD. Lungs:  Resp regular and unlabored, scattered rales bilaterally. Heart: RRR no s3, s4, or murmurs. Abdomen: Soft, non-tender, non-distended, BS + x 4.  Extremities: No clubbing, cyanosis or edema. DP/PT/Radials 2+ and equal bilaterally.  Accessory Clinical Findings  CBC  Recent Labs  12/02/14 0520 12/03/14 0400  WBC 5.8 4.2  NEUTROABS 4.1  --   HGB 10.2* 9.7*  HCT 31.1* 30.5*  MCV 81.0 81.8  PLT 117* 732*   Basic Metabolic Panel  Recent Labs  12/03/14 0400 12/04/14 0350  NA 140 138  K 3.3* 3.7  CL 103 103  CO2 27 25  GLUCOSE 108* 93  BUN 26* 28*  CREATININE 1.30* 1.18*  CALCIUM 9.1 9.2   Liver Function Tests  Recent Labs  12/01/14 2202 12/02/14 0520  AST 25 18  ALT 16 12  ALKPHOS 60 49  BILITOT 0.4 0.3  PROT 8.2 6.8  ALBUMIN 3.9 3.1*    Recent Labs  12/01/14 2202  LIPASE 14   Cardiac Enzymes  Recent Labs  12/01/14 1927 12/01/14 2355 12/02/14 0520  TROPONINI <0.30 <0.30 <0.30   BNP Invalid input(s): POCBNP D-Dimer No results for input(s): DDIMER in the last 72 hours. Hemoglobin A1C  Recent Labs  12/03/14 0400  HGBA1C 7.9*   Fasting Lipid Panel No results for input(s): CHOL, HDL, LDLCALC, TRIG, CHOLHDL, LDLDIRECT in the last 72 hours. Thyroid Function Tests No results for input(s): TSH, T4TOTAL, T3FREE, THYROIDAB in the last 72 hours.  Invalid input(s): FREET3  TELE  nsr  Radiology/Studies  Dg Chest Port 1 View  12/03/2014   CLINICAL DATA:  Followup pneumonia  EXAM: PORTABLE CHEST - 1 VIEW  COMPARISON:  12/01/2014  FINDINGS: Cardiomegaly again noted. There is improvement in aeration. Previous right infiltrates has cleared. Persistent mild right hilar and right paratracheal prominence probable vascular in nature. No pulmonary edema  IMPRESSION: Improvement in aeration. No residual infiltrate or pulmonary edema.  Persistent mild right hilar and right paratracheal prominence probable vascular in nature.   Electronically Signed   By: Lahoma Crocker M.D.   On: 12/03/2014 10:49   Dg Chest Portable 1 View  12/01/2014   CLINICAL DATA:  Abdominal pain, nauseated, diarrhea for 1 day, no chest pain  EXAM: PORTABLE CHEST - 1 VIEW  COMPARISON:  10/03/2009  FINDINGS: Right upper lobe, right lower lobe and left lower lobe airspace disease. Bilateral mild interstitial thickening. No pleural effusion or pneumothorax. Stable cardiomediastinal silhouette. No acute osseous abnormality.  IMPRESSION: Right upper lobe, right lower lobe and left lower lobe airspace disease most concerning for multilobar pneumonia.   Electronically Signed   By: Kathreen Devoid   On: 12/01/2014 20:07    ASSESSMENT AND PLAN  1. Acute on chronic diastolic heart failure - her weight has gone up each day. She has rales on exam. I would suggest Lasix 60-80 mg IV. We will follow with you.  2. Pneumonia -  No fever or elevated WBC.   Gregg Taylor,M.D.  12/04/2014 7:42 AM

## 2014-12-04 NOTE — Progress Notes (Signed)
Physical Therapy Treatment Patient Details Name: Rachel Copeland MRN: [id number] DOB: 05/22/1929 Today's Date: 12/04/2014    History of Present Illness 80 yo female admitted 12/01/14 with wEAKNESS, sob, FOUND TO HAVE PNEUMONIA. h/o dm, chf, htn.    PT Comments    *Pt walked 300' with RW independently. She is steady and had no SOB. She is also independent with transfers. PT goals have been met, she is safe to DC home from PT standpoint.  **  Follow Up Recommendations  No follow up needed     Equipment Recommendations  Rolling walker with 5" wheels    Recommendations for Other Services       Precautions / Restrictions Precautions Precautions: None Restrictions Weight Bearing Restrictions: No    Mobility  Bed Mobility               General bed mobility comments: up in chair  Transfers Overall transfer level: Modified independent Equipment used: None Transfers: Sit to/from Stand Sit to Stand: Modified independent (Device/Increase time)         General transfer comment: used armrests  Ambulation/Gait   Ambulation Distance (Feet): 300 Feet Assistive device: Rolling walker (2 wheeled) Gait Pattern/deviations: Step-through pattern Gait velocity: wfl   General Gait Details: pt walked 300' with RW, she stated she likes having the support of the RW (walks without AD at baseline), steady with RW, no LOB, good positioning and safety awareness with RW   Stairs            Wheelchair Mobility    Modified Rankin (Stroke Patients Only)       Balance                                    Cognition Arousal/Alertness: Awake/alert Behavior During Therapy: WFL for tasks assessed/performed Overall Cognitive Status: Within Functional Limits for tasks assessed                      Exercises General Exercises - Lower Extremity Ankle Circles/Pumps: AROM;Both;10 reps;Supine    General Comments        Pertinent Vitals/Pain Pain  Assessment: No/denies pain    Home Living                      Prior Function            PT Goals (current goals can now be found in the care plan section) Acute Rehab PT Goals Patient Stated Goal: to go back home PT Goal Formulation: With patient Time For Goal Achievement: 12/17/14 Potential to Achieve Goals: Good Progress towards PT goals: Goals met/education completed, patient discharged from PT    Frequency  Min 3X/week    PT Plan Current plan remains appropriate    Co-evaluation             End of Session Equipment Utilized During Treatment: Gait belt Activity Tolerance: Patient tolerated treatment well Patient left: in chair;with call bell/phone within reach     Time: 1257-1309 PT Time Calculation (min) (ACUTE ONLY): 12 min  Charges:  $Gait Training: 8-22 mins                    G Codes:      Rachel Copeland 12/04/2014, 1:15 PM [phone]

## 2014-12-04 NOTE — Progress Notes (Signed)
Patient ID: Rachel Copeland, female   DOB: Mar 04, 1929, 80 y.o.   MRN: [id number] [facility] PROGRESS NOTE  Rachel Copeland QQP:[id number] DOB: 01-Dec-1929 DOA: 12/01/2014 PCP: Elyn Peers, MD  Brief narrative:    80 y.o. female with history of diabetes mellitus, chronic diastolic CHF (no previous 2 D ECHO on file), hypertension, dyslipidemia, CKD stage 4 who presented to [facility] ED with progressively worsening shortness of breath with cough productive of yellowish sputum for past few days prior to this admission. Patient also had and episode of nausea and vomiting but this has now subsided. In ED, pt found to be hypoxic with O2 sat 68% on room air. She has improved with Bland oxygen support. CXR showed right upper lobe, right lower lobe and left lower lobe airspace disease most concerning for multilobar pneumonia. Additionally, her 12 lead EKG showed LBBB but no old EKG for comparison. No complaints of chest pain. Troponin levels were all negative total of 3 sets. She was given lasix 40 mg IV in ED and then continued of lasix 20 mg daily.   Assessment/Plan:    Principal Problem:  Acute respiratory failure with hypoxia / multilobar pneumonia / community acquired pneumonia - hypoxia secondary to multilobar pneumonia. CXR on admission showed right upper lobe, right lower lobe and left lower lobe airspace disease most concerning for multilobar pneumonia. - pt started on azithromycin and rocephin patient feels better this morning. We repeated chest x-ray which shows improvement in pneumonia - Strep pneumoniae is negative, influenza is negative and blood cultures so far negative. Legionella is negative.   Active Problems:  Diabetes mellitus type 2, controlled - A1c on this admission is 7.9 slightly above the goal A1c for diabetics.  - we stopped Actos because it could contribute to worsening congestive heart failure. - Patient is currently on sliding scale insulin. - CBG's in past 24  hours: 255, 106, 192   HTN (hypertension) - continue Norvasc, coreg and lasix   Dyslipidemia - continue vytorin and omega 3   Chronic diastolic CHF (congestive heart failure) / LBBB on EKG - Repeat 12-lead EKG with left bundle branch block. No complaints of chest pain. Troponin levels within normal limits.  - BNP only mildly elevated at 576. Given lasix 40 mg IV once in ED. Then on lasix 20 mg daily PO. Since she had weight gain ion past 24 hours, cardio recommends  IV lasix.  - Continue coreg - Diovan on hold due to renal insufficiency.  - Continue an aspirin daily - Replete electrolytes as needed.   Hypokalemia - Due to lasix - Repleted    Anemia of chronic disease - Secondary to history of CKD - Hemoglobin is 10.2 - no current indications for transfusion    Thrombocytopenia - this is a chronic problem, unclear etiology - will continue to monitor  - using SCD's for DVT prophylaxis    Chronic kidney disease (CKD), stage IV (severe) - baseline creatinine in 2.4 range and on this admission 1.18 - 1.30. We temporarily held Diovan to see if kidney function improves. Please note that kidney function however better than baseline and pt was previously on this medication.   DVT Prophylaxis  - SCD's bilaterally   Code Status: Full.  Family Communication: plan of care discussed with the patient Disposition Plan: Home when stable.    IV access:   Peripheral IV  Procedures and diagnostic studies:   Dg Chest Port 1 View 12/03/2014 mprovement in aeration. No residual infiltrate or pulmonary  edema. Persistent mild right hilar and right paratracheal prominence probable vascular in nature.   Dg Chest Portable 1 View 12/01/2014 Right upper lobe, right lower lobe and left lower lobe airspace disease most concerning for multilobar pneumonia.   Medical Consultants:   Cardiology  Other Consultants:   PT/OT  IAnti-Infectives:     Azithromycin 12/01/2014 -->  Rocephin 12/01/2014 -->   Leisa Lenz, MD  [facility] Pager [phone]  If 7PM-7AM, please contact night-coverage [email or web address] Password [email or web address] 12/04/2014, 2:36 PM   LOS: 3 days    HPI/Subjective: No acute overnight events.  Objective: Filed Vitals:   12/03/14 2219 12/04/14 0418 12/04/14 0830 12/04/14 1418  BP: 128/41 166/45 156/59 137/48  Pulse: 63 64 68 62  Temp: 97.8 F (36.6 C) 98.3 F (36.8 C)  98.1 F (36.7 C)  TempSrc: Oral Oral  Oral  Resp: 18 20  18   Height:      Weight:  102.7 kg (226 lb 6.6 oz)    SpO2: 95% 100%  100%    Intake/Output Summary (Last 24 hours) at 12/04/14 1436 Last data filed at 12/04/14 1329  Gross per 24 hour  Intake   1020 ml  Output    700 ml  Net    320 ml    Exam:   General:  Pt is alert, follows commands appropriately, not in acute distress  Cardiovascular: Regular rate and rhythm, S1/S2 appreciated   Respiratory: Clear to auscultation bilaterally, no wheezing, no crackles, no rhonchi  Abdomen: Soft, non tender, non distended, bowel sounds present  Extremities: LE edema, pulses DP and PT palpable bilaterally  Neuro: Grossly nonfocal  Data Reviewed: Basic Metabolic Panel:  Recent Labs Lab 12/01/14 1927 12/01/14 2034 12/02/14 0520 12/03/14 0400 12/04/14 0350  NA 140 142 140 140 138  K 3.4* 3.3* 3.1* 3.3* 3.7  CL 100 104 101 103 103  CO2 26  --  26 27 25   GLUCOSE 207* 209* 117* 108* 93  BUN 22 23 23  26* 28*  CREATININE 1.10 1.20* 1.27* 1.30* 1.18*  CALCIUM 10.4  --  9.4 9.1 9.2   Liver Function Tests:  Recent Labs Lab 12/01/14 2202 12/02/14 0520  AST 25 18  ALT 16 12  ALKPHOS 60 49  BILITOT 0.4 0.3  PROT 8.2 6.8  ALBUMIN 3.9 3.1*    Recent Labs Lab 12/01/14 2202  LIPASE 14   No results for input(s): AMMONIA in the last 168 hours. CBC:  Recent Labs Lab 12/01/14 1927 12/01/14 2034 12/02/14 0520 12/03/14 0400  WBC 6.5  --  5.8 4.2  NEUTROABS  --    --  4.1  --   HGB 11.5* 13.9 10.2* 9.7*  HCT 35.4* 41.0 31.1* 30.5*  MCV 80.6  --  81.0 81.8  PLT 116*  --  117* 100*   Cardiac Enzymes:  Recent Labs Lab 12/01/14 1927 12/01/14 2355 12/02/14 0520  TROPONINI <0.30 <0.30 <0.30   BNP: Invalid input(s): POCBNP CBG:  Recent Labs Lab 12/03/14 1201 12/03/14 1642 12/03/14 2224 12/04/14 0754 12/04/14 1136  GLUCAP 229* 112* 255* 106* 192*    Recent Results (from the past 240 hour(s))  Culture, blood (routine x 2)     Status: None (Preliminary result)   Collection Time: 12/01/14  8:40 PM  Result Value Ref Range Status   Specimen Description BLOOD RIGHT HAND  Final   Special Requests BOTTLES DRAWN AEROBIC AND ANAEROBIC 3ML  Final   Culture  Setup Time   Final  12/02/2014 00:57 Performed at [facility]    Culture   Final           BLOOD CULTURE RECEIVED NO GROWTH TO DATE CULTURE WILL BE HELD FOR 5 DAYS BEFORE ISSUING A FINAL NEGATIVE REPORT Performed at [facility]    Report Status PENDING  Incomplete  Culture, blood (routine x 2)     Status: None (Preliminary result)   Collection Time: 12/01/14  8:40 PM  Result Value Ref Range Status   Specimen Description BLOOD RIGHT WRIST  Final   Special Requests BOTTLES DRAWN AEROBIC ONLY 3ML  Final   Culture  Setup Time   Final    12/02/2014 00:57 Performed at [facility]    Culture   Final           BLOOD CULTURE RECEIVED NO GROWTH TO DATE CULTURE WILL BE HELD FOR 5 DAYS BEFORE ISSUING A FINAL NEGATIVE REPORT Performed at [facility]    Report Status PENDING  Incomplete     Scheduled Meds: . amLODipine  10 mg Oral Daily  . antiseptic oral rinse  7 mL Mouth Rinse BID  . aspirin  81 mg Oral Daily  . azithromycin  500 mg Intravenous Q24H  . carvedilol  25 mg Oral BID WC  . cefTRIAXone (ROCEPHIN)  IV  1 g Intravenous Q24H  . ezetimibe-simvastatin  1 tablet Oral QHS  . furosemide  40 mg Oral q morning - 10a  . insulin aspart  0-9 Units  Subcutaneous TID WC  . omega-3 acid ethyl esters  1 g Oral q morning - 10a  . sodium chloride  3 mL Intravenous Q12H  . sodium chloride  3 mL Intravenous Q12H   Continuous Infusions:

## 2014-12-04 NOTE — Progress Notes (Signed)
ANTIBIOTIC CONSULT NOTE - INITIAL  Pharmacy Consult for Rocephin Indication: pneumonia  Allergies  Allergen Reactions  . Sitagliptin Phosphate     REACTION: Finger and hand tingling RUQ pain    Patient Measurements: Height: 5\' 4"  (162.6 cm) Weight: 226 lb 6.6 oz (102.7 kg) IBW/kg (Calculated) : 54.7   Vital Signs: Temp: 98.3 F (36.8 C) (12/11 0418) Temp Source: Oral (12/11 0418) BP: 156/59 mmHg (12/11 0830) Pulse Rate: 68 (12/11 0830) Intake/Output from previous day: 12/10 0701 - 12/11 0700 In: 1020 [P.O.:720; IV Piggyback:300] Out: 700 [Urine:700] Intake/Output from this shift: Total I/O In: 240 [P.O.:240] Out: -   Labs:  Recent Labs  12/01/14 1927 12/01/14 2034 12/02/14 0520 12/03/14 0400 12/04/14 0350  WBC 6.5  --  5.8 4.2  --   HGB 11.5* 13.9 10.2* 9.7*  --   PLT 116*  --  117* 100*  --   CREATININE 1.10 1.20* 1.27* 1.30* 1.18*   Estimated Creatinine Clearance: 42.1 mL/min (by C-G formula based on Cr of 1.18). No results for input(s): VANCOTROUGH, VANCOPEAK, VANCORANDOM, GENTTROUGH, GENTPEAK, GENTRANDOM, TOBRATROUGH, TOBRAPEAK, TOBRARND, AMIKACINPEAK, AMIKACINTROU, AMIKACIN in the last 72 hours.   Microbiology: Recent Results (from the past 720 hour(s))  Culture, blood (routine x 2)     Status: None (Preliminary result)   Collection Time: 12/01/14  8:40 PM  Result Value Ref Range Status   Specimen Description BLOOD RIGHT HAND  Final   Special Requests BOTTLES DRAWN AEROBIC AND ANAEROBIC 3ML  Final   Culture  Setup Time   Final    12/02/2014 00:57 Performed at [facility]    Culture   Final           BLOOD CULTURE RECEIVED NO GROWTH TO DATE CULTURE WILL BE HELD FOR 5 DAYS BEFORE ISSUING A FINAL NEGATIVE REPORT Performed at [facility]    Report Status PENDING  Incomplete  Culture, blood (routine x 2)     Status: None (Preliminary result)   Collection Time: 12/01/14  8:40 PM  Result Value Ref Range Status   Specimen Description  BLOOD RIGHT WRIST  Final   Special Requests BOTTLES DRAWN AEROBIC ONLY 3ML  Final   Culture  Setup Time   Final    12/02/2014 00:57 Performed at [facility]    Culture   Final           BLOOD CULTURE RECEIVED NO GROWTH TO DATE CULTURE WILL BE HELD FOR 5 DAYS BEFORE ISSUING A FINAL NEGATIVE REPORT Performed at [facility]    Report Status PENDING  Incomplete    Medical History: Past Medical History  Diagnosis Date  . Diabetes mellitus   . Hypertension   . Hyperlipidemia     Medications:  Scheduled:  . amLODipine  10 mg Oral Daily  . antiseptic oral rinse  7 mL Mouth Rinse BID  . aspirin  81 mg Oral Daily  . azithromycin  500 mg Intravenous Q24H  . carvedilol  25 mg Oral BID WC  . cefTRIAXone (ROCEPHIN)  IV  1 g Intravenous Q24H  . ezetimibe-simvastatin  1 tablet Oral QHS  . furosemide  40 mg Oral q morning - 10a  . insulin aspart  0-9 Units Subcutaneous TID WC  . omega-3 acid ethyl esters  1 g Oral q morning - 10a  . sodium chloride  3 mL Intravenous Q12H  . sodium chloride  3 mL Intravenous Q12H   Infusions:     Assessment: 85 yoF  c/o SOB. CXR shows multilobar pneumonia. Rocephin per Rx and Zmax per MD for CAP.  12/8 >>rocephin  >> 12/8 >>zmax  >>    Tmax: AF WBCs: wnl Renal: 1.18, CrCl 42 ml/min, stable  12/8 blood: NGTD x 2 12/8 urine strep/legionella: neg/neg 12/9 influenza panel: neg 12/10 CXR: improved aeration  Drug level / dose changes info:  12/11: D4 Rocephin 1g IV q24h and Zmax 500mg  q24h for CAP.    Goal of Therapy:  Vancomycin trough level 15-20 mcg/ml  Plan:   Rocephin 1Gm IV q24h  Zmax 500mg  IV q24h (MD)  F/u cultures as needed  Reuel Boom, PharmD Pager: [phone] 12/04/2014, 12:29 PM

## 2014-12-05 DIAGNOSIS — J189 Pneumonia, unspecified organism: Secondary | ICD-10-CM | POA: Insufficient documentation

## 2014-12-05 DIAGNOSIS — N184 Chronic kidney disease, stage 4 (severe): Secondary | ICD-10-CM

## 2014-12-05 LAB — BASIC METABOLIC PANEL
Anion gap: 10 (ref 5–15)
BUN: 31 mg/dL — AB (ref 6–23)
CALCIUM: 9.3 mg/dL (ref 8.4–10.5)
CHLORIDE: 104 meq/L (ref 96–112)
CO2: 28 meq/L (ref 19–32)
Creatinine, Ser: 1.24 mg/dL — ABNORMAL HIGH (ref 0.50–1.10)
GFR calc Af Amer: 45 mL/min — ABNORMAL LOW (ref >90)
GFR calc non Af Amer: 39 mL/min — ABNORMAL LOW (ref >90)
GLUCOSE: 126 mg/dL — AB (ref 70–99)
Potassium: 3.7 mEq/L (ref 3.7–5.3)
Sodium: 142 mEq/L (ref 137–147)

## 2014-12-05 LAB — GLUCOSE, CAPILLARY
GLUCOSE-CAPILLARY: 122 mg/dL — AB (ref 70–99)
Glucose-Capillary: 199 mg/dL — ABNORMAL HIGH (ref 70–99)
Glucose-Capillary: 120 mg/dL — ABNORMAL HIGH (ref 70–99)
Glucose-Capillary: 134 mg/dL — ABNORMAL HIGH (ref 70–99)
Glucose-Capillary: 139 mg/dL — ABNORMAL HIGH (ref 70–99)

## 2014-12-05 NOTE — Progress Notes (Signed)
Patient ID: Rachel Copeland, female   DOB: 19-Jan-1929, 80 y.o.   MRN: [id number]  Cardiology following at a distance. I will see patient tomorrow morning.  Daryel November, MD

## 2014-12-05 NOTE — Progress Notes (Signed)
Pharmacy will sign-off Rocephin protocol. No dose adjustments are likely to be necessary. Consider switching abx to oral when appropriate.   Thank you.  Romeo Rabon, PharmD, pager [phone]. 12/05/2014,12:15 PM.

## 2014-12-05 NOTE — Progress Notes (Signed)
Patient ID: Rachel Copeland, female   DOB: 1929-01-17, 80 y.o.   MRN: [id number] [facility] PROGRESS NOTE  WADE SIGALA FWY:[id number] DOB: 09-30-1929 DOA: 12/01/2014 PCP: Elyn Peers, MD  Brief narrative:    80 y.o. female with history of diabetes mellitus, chronic diastolic CHF (no previous 2 D ECHO on file), hypertension, dyslipidemia, CKD stage 4 who presented to [facility] ED with progressively worsening shortness of breath with cough productive of yellowish sputum for past few days prior to this admission. Patient also had and episode of nausea and vomiting but this has now subsided. In ED, pt found to be hypoxic with O2 sat 68% on room air. She has improved with [place] oxygen support. CXR showed right upper lobe, right lower lobe and left lower lobe airspace disease most concerning for multilobar pneumonia. Additionally, her 12 lead EKG showed LBBB but no old EKG for comparison. No complaints of chest pain. Troponin levels were all negative total of 3 sets. She was given lasix 40 mg IV in ED and then continued of lasix 20 mg daily. Since she had some weight gain cardio recommended IV lasix.   Assessment/Plan:     Principal Problem:  Acute respiratory failure with hypoxia / multilobar pneumonia / community acquired pneumonia - hypoxia secondary to multilobar pneumonia. CXR on admission showed right upper lobe, right lower lobe and left lower lobe airspace disease most concerning for multilobar pneumonia. - pt started on azithromycin and rocephin  - repeat chest x-ray shows improvement in pneumonia - Strep pneumoniae is negative, influenza is negative and blood cultures so far negative. Legionella is negative.   Active Problems:  Diabetes mellitus type 2, controlled - A1c on this admission is 7.9 slightly above the goal A1c for diabetics.  - we stopped Actos because it could contribute to worsening congestive heart failure. - Patient is currently on sliding scale insulin. -  will follow up on diabetic coordinator recommendations    HTN (hypertension) - continue Norvasc, coreg and lasix   Dyslipidemia - continue vytorin and omega 3   Chronic diastolic CHF (congestive heart failure) / LBBB on EKG - Repeat 12-lead EKG with left bundle branch block. No complaints of chest pain. Troponin levels within normal limits.  - BNP only mildly elevated at 576. Given lasix 40 mg IV once in ED. Then on lasix 20 mg daily PO. Since she had weight gain in past 24 hours, cardio recommended IV lasix.  - weight trend since admission: 225 lbs --> 228 lbs  - Continue coreg - Diovan on hold due to renal insufficiency.  - Continue an aspirin daily - Replete electrolytes as needed.   Hypokalemia - Due to lasix - Repleted    Anemia of chronic disease - Secondary to history of CKD - Hemoglobin is 10.2 - no current indications for transfusion    Thrombocytopenia - this is a chronic problem, unclear etiology - will continue to monitor  - using SCD's for DVT prophylaxis    Chronic kidney disease (CKD), stage IV (severe) - baseline creatinine in 2.4 range and on this admission 1.18 - 1.30. We temporarily held Diovan to see if kidney function improves. Please note that kidney function however better than baseline and pt was previously on this medication.   DVT Prophylaxis  - SCD's bilaterally   Code Status: Full.  Family Communication: plan of care discussed with the patient Disposition Plan: Home when stable.    IV access:   Peripheral IV  Procedures and diagnostic studies:  Dg Chest Port 1 View 12/03/2014 mprovement in aeration. No residual infiltrate or pulmonary edema. Persistent mild right hilar and right paratracheal prominence probable vascular in nature.   Dg Chest Portable 1 View 12/01/2014 Right upper lobe, right lower lobe and left lower lobe airspace disease most concerning for multilobar pneumonia.   Medical  Consultants:   Cardiology  Other Consultants:   PT/OT  IAnti-Infectives:    Azithromycin 12/01/2014 -->  Rocephin 12/01/2014 -->     Leisa Lenz, MD  [facility] Pager [phone]  If 7PM-7AM, please contact night-coverage [email or web address] Password [email or web address] 12/05/2014, 4:30 PM   LOS: 4 days    HPI/Subjective: No acute overnight events.  Objective: Filed Vitals:   12/04/14 1418 12/04/14 2114 12/05/14 0501 12/05/14 1430  BP: 137/48 100/55 130/47 141/56  Pulse: 62 60 64 60  Temp: 98.1 F (36.7 C) 97.7 F (36.5 C) 98.5 F (36.9 C) 97.5 F (36.4 C)  TempSrc: Oral Oral Oral Oral  Resp: 18 18 18 16   Height:      Weight:   103.6 kg (228 lb 6.3 oz)   SpO2: 100% 94% 100% 99%    Intake/Output Summary (Last 24 hours) at 12/05/14 1630 Last data filed at 12/05/14 0845  Gross per 24 hour  Intake   1503 ml  Output   1350 ml  Net    153 ml    Exam:   General:  Pt is alert, follows commands appropriately, not in acute distress  Cardiovascular: Regular rate and rhythm, S1/S2 appreciated   Respiratory: no wheezing, no crackles, no rhonchi  Abdomen: Soft, non tender, non distended, bowel sounds present  Extremities: trace pitting LE edema, pulses DP and PT palpable bilaterally  Neuro: Grossly nonfocal  Data Reviewed: Basic Metabolic Panel:  Recent Labs Lab 12/01/14 1927 12/01/14 2034 12/02/14 0520 12/03/14 0400 12/04/14 0350 12/05/14 0428  NA 140 142 140 140 138 142  K 3.4* 3.3* 3.1* 3.3* 3.7 3.7  CL 100 104 101 103 103 104  CO2 26  --  26 27 25 28   GLUCOSE 207* 209* 117* 108* 93 126*  BUN 22 23 23  26* 28* 31*  CREATININE 1.10 1.20* 1.27* 1.30* 1.18* 1.24*  CALCIUM 10.4  --  9.4 9.1 9.2 9.3   Liver Function Tests:  Recent Labs Lab 12/01/14 2202 12/02/14 0520  AST 25 18  ALT 16 12  ALKPHOS 60 49  BILITOT 0.4 0.3  PROT 8.2 6.8  ALBUMIN 3.9 3.1*    Recent Labs Lab 12/01/14 2202  LIPASE 14   No results for input(s): AMMONIA  in the last 168 hours. CBC:  Recent Labs Lab 12/01/14 1927 12/01/14 2034 12/02/14 0520 12/03/14 0400  WBC 6.5  --  5.8 4.2  NEUTROABS  --   --  4.1  --   HGB 11.5* 13.9 10.2* 9.7*  HCT 35.4* 41.0 31.1* 30.5*  MCV 80.6  --  81.0 81.8  PLT 116*  --  117* 100*   Cardiac Enzymes:  Recent Labs Lab 12/01/14 1927 12/01/14 2355 12/02/14 0520  TROPONINI <0.30 <0.30 <0.30   BNP: Invalid input(s): POCBNP CBG:  Recent Labs Lab 12/04/14 1136 12/04/14 1624 12/04/14 2111 12/05/14 0729 12/05/14 1220  GLUCAP 192* 94 199* 120* 134*    Recent Results (from the past 240 hour(s))  Culture, blood (routine x 2)     Status: None (Preliminary result)   Collection Time: 12/01/14  8:40 PM  Result Value Ref Range Status   Specimen Description BLOOD RIGHT HAND  Final   Special Requests BOTTLES DRAWN AEROBIC AND ANAEROBIC 3ML  Final   Culture  Setup Time   Final    12/02/2014 00:57 Performed at [facility]    Culture   Final           BLOOD CULTURE RECEIVED NO GROWTH TO DATE CULTURE WILL BE HELD FOR 5 DAYS BEFORE ISSUING A FINAL NEGATIVE REPORT Performed at [facility]    Report Status PENDING  Incomplete  Culture, blood (routine x 2)     Status: None (Preliminary result)   Collection Time: 12/01/14  8:40 PM  Result Value Ref Range Status   Specimen Description BLOOD RIGHT WRIST  Final   Special Requests BOTTLES DRAWN AEROBIC ONLY 3ML  Final   Culture  Setup Time   Final    12/02/2014 00:57 Performed at [facility]    Culture   Final           BLOOD CULTURE RECEIVED NO GROWTH TO DATE CULTURE WILL BE HELD FOR 5 DAYS BEFORE ISSUING A FINAL NEGATIVE REPORT Performed at [facility]    Report Status PENDING  Incomplete     Scheduled Meds: . amLODipine  10 mg Oral Daily  . antiseptic oral rinse  7 mL Mouth Rinse BID  . aspirin  81 mg Oral Daily  . azithromycin  500 mg Intravenous Q24H  . carvedilol  25 mg Oral BID WC  . cefTRIAXone  (ROCEPHIN)  IV  1 g Intravenous Q24H  . ezetimibe-simvastatin  1 tablet Oral QHS  . furosemide  40 mg Intravenous Daily  . insulin aspart  0-9 Units Subcutaneous TID WC  . omega-3 acid ethyl esters  1 g Oral q morning - 10a

## 2014-12-06 LAB — GLUCOSE, CAPILLARY
Glucose-Capillary: 120 mg/dL — ABNORMAL HIGH (ref 70–99)
Glucose-Capillary: 131 mg/dL — ABNORMAL HIGH (ref 70–99)

## 2014-12-06 MED ORDER — POTASSIUM CHLORIDE ER 10 MEQ PO TBCR: 10.0000 meq | EXTENDED_RELEASE_TABLET | Freq: Every day | ORAL | Status: DC

## 2014-12-06 MED ORDER — LEVOFLOXACIN 750 MG PO TABS: 750.0000 mg | ORAL_TABLET | ORAL | Status: DC

## 2014-12-06 MED ORDER — HYDRALAZINE HCL 10 MG PO TABS
10.0000 mg | ORAL_TABLET | Freq: Once | ORAL | Status: AC
  Administered 2014-12-06: 10 mg via ORAL
  Filled 2014-12-06: qty 1

## 2014-12-06 MED ORDER — FUROSEMIDE 40 MG PO TABS: 40.0000 mg | ORAL_TABLET | Freq: Every morning | ORAL | Status: DC

## 2014-12-06 MED ORDER — GLIPIZIDE 10 MG PO TABS: 10.0000 mg | ORAL_TABLET | Freq: Every day | ORAL | Status: DC

## 2014-12-06 NOTE — Discharge Instructions (Signed)
Heart Failure °Heart failure is a condition in which the heart has trouble pumping blood. This means your heart does not pump blood efficiently for your body to work well. In some cases of heart failure, fluid may back up into your lungs or you may have swelling (edema) in your lower legs. Heart failure is usually a long-term (chronic) condition. It is important for you to take good care of yourself and follow your health care provider's treatment plan. °CAUSES  °Some health conditions can cause heart failure. Those health conditions include: °· High blood pressure (hypertension). Hypertension causes the heart muscle to work harder than normal. When pressure in the blood vessels is high, the heart needs to pump (contract) with more force in order to circulate blood throughout the body. High blood pressure eventually causes the heart to become stiff and weak. °· Coronary artery disease (CAD). CAD is the buildup of cholesterol and fat (plaque) in the arteries of the heart. The blockage in the arteries deprives the heart muscle of oxygen and blood. This can cause chest pain and may lead to a heart attack. High blood pressure can also contribute to CAD. °· Heart attack (myocardial infarction). A heart attack occurs when one or more arteries in the heart become blocked. The loss of oxygen damages the muscle tissue of the heart. When this happens, part of the heart muscle dies. The injured tissue does not contract as well and weakens the heart's ability to pump blood. °· Abnormal heart valves. When the heart valves do not open and close properly, it can cause heart failure. This makes the heart muscle pump harder to keep the blood flowing. °· Heart muscle disease (cardiomyopathy or myocarditis). Heart muscle disease is damage to the heart muscle from a variety of causes. These can include drug or alcohol abuse, infections, or unknown reasons. These can increase the risk of heart failure. °· Lung disease. Lung disease  makes the heart work harder because the lungs do not work properly. This can cause a strain on the heart, leading it to fail. °· Diabetes. Diabetes increases the risk of heart failure. High blood sugar contributes to high fat (lipid) levels in the blood. Diabetes can also cause slow damage to tiny blood vessels that carry important nutrients to the heart muscle. When the heart does not get enough oxygen and food, it can cause the heart to become weak and stiff. This leads to a heart that does not contract efficiently. °· Other conditions can contribute to heart failure. These include abnormal heart rhythms, thyroid problems, and low blood counts (anemia). °Certain unhealthy behaviors can increase the risk of heart failure, including: °· Being overweight. °· Smoking or chewing tobacco. °· Eating foods high in fat and cholesterol. °· Abusing illicit drugs or alcohol. °· Lacking physical activity. °SYMPTOMS  °Heart failure symptoms may vary and can be hard to detect. Symptoms may include: °· Shortness of breath with activity, such as climbing stairs. °· Persistent cough. °· Swelling of the feet, ankles, legs, or abdomen. °· Unexplained weight gain. °· Difficulty breathing when lying flat (orthopnea). °· Waking from sleep because of the need to sit up and get more air. °· Rapid heartbeat. °· Fatigue and loss of energy. °· Feeling light-headed, dizzy, or close to fainting. °· Loss of appetite. °· Nausea. °· Increased urination during the night (nocturia). °DIAGNOSIS  °A diagnosis of heart failure is based on your history, symptoms, physical examination, and diagnostic tests. Diagnostic tests for heart failure may include: °·   Echocardiography. °· Electrocardiography. °· Chest X-ray. °· Blood tests. °· Exercise stress test. °· Cardiac angiography. °· Radionuclide scans. °TREATMENT  °Treatment is aimed at managing the symptoms of heart failure. Medicines, behavioral changes, or surgical intervention may be necessary to  treat heart failure. °· Medicines to help treat heart failure may include: °¨ Angiotensin-converting enzyme (ACE) inhibitors. This type of medicine blocks the effects of a blood protein called angiotensin-converting enzyme. ACE inhibitors relax (dilate) the blood vessels and help lower blood pressure. °¨ Angiotensin receptor blockers (ARBs). This type of medicine blocks the actions of a blood protein called angiotensin. Angiotensin receptor blockers dilate the blood vessels and help lower blood pressure. °¨ Water pills (diuretics). Diuretics cause the kidneys to remove salt and water from the blood. The extra fluid is removed through urination. This loss of extra fluid lowers the volume of blood the heart pumps. °¨ Beta blockers. These prevent the heart from beating too fast and improve heart muscle strength. °¨ Digitalis. This increases the force of the heartbeat. °· Healthy behavior changes include: °¨ Obtaining and maintaining a healthy weight. °¨ Stopping smoking or chewing tobacco. °¨ Eating heart-healthy foods. °¨ Limiting or avoiding alcohol. °¨ Stopping illicit drug use. °¨ Physical activity as directed by your health care provider. °· Surgical treatment for heart failure may include: °¨ A procedure to open blocked arteries, repair damaged heart valves, or remove damaged heart muscle tissue. °¨ A pacemaker to improve heart muscle function and control certain abnormal heart rhythms. °¨ An internal cardioverter defibrillator to treat certain serious abnormal heart rhythms. °¨ A left ventricular assist device (LVAD) to assist the pumping ability of the heart. °HOME CARE INSTRUCTIONS  °· Take medicines only as directed by your health care provider. Medicines are important in reducing the workload of your heart, slowing the progression of heart failure, and improving your symptoms. °¨ Do not stop taking your medicine unless directed by your health care provider. °¨ Do not skip any dose of medicine. °¨ Refill your  prescriptions before you run out of medicine. Your medicines are needed every day. °· Engage in moderate physical activity if directed by your health care provider. Moderate physical activity can benefit some people. The elderly and people with severe heart failure should consult with a health care provider for physical activity recommendations. °· Eat heart-healthy foods. Food choices should be free of trans fat and low in saturated fat, cholesterol, and salt (sodium). Healthy choices include fresh or frozen fruits and vegetables, fish, lean meats, legumes, fat-free or low-fat dairy products, and whole grain or high fiber foods. Talk to a dietitian to learn more about heart-healthy foods. °· Limit sodium if directed by your health care provider. Sodium restriction may reduce symptoms of heart failure in some people. Talk to a dietitian to learn more about heart-healthy seasonings. °· Use healthy cooking methods. Healthy cooking methods include roasting, grilling, broiling, baking, poaching, steaming, or stir-frying. Talk to a dietitian to learn more about healthy cooking methods. °· Limit fluids if directed by your health care provider. Fluid restriction may reduce symptoms of heart failure in some people. °· Weigh yourself every day. Daily weights are important in the early recognition of excess fluid. You should weigh yourself every morning after you urinate and before you eat breakfast. Wear the same amount of clothing each time you weigh yourself. Record your daily weight. Provide your health care provider with your weight record. °· Monitor and record your blood pressure if directed by your health care   provider.  Check your pulse if directed by your health care provider.  Lose weight if directed by your health care provider. Weight loss may reduce symptoms of heart failure in some people.  Stop smoking or chewing tobacco. Nicotine makes your heart work harder by causing your blood vessels to constrict.  Do not use nicotine gum or patches before talking to your health care provider.  Keep all follow-up visits as directed by your health care provider. This is important.  Limit alcohol intake to no more than 1 drink per day for nonpregnant women and 2 drinks per day for men. One drink equals 12 ounces of beer, 5 ounces of wine, or 1 ounces of hard liquor. Drinking more than that is harmful to your heart. Tell your health care provider if you drink alcohol several times a week. Talk with your health care provider about whether alcohol is safe for you. If your heart has already been damaged by alcohol or you have severe heart failure, drinking alcohol should be stopped completely.  Stop illicit drug use.  Stay up-to-date with immunizations. It is especially important to prevent respiratory infections through current pneumococcal and influenza immunizations.  Manage other health conditions such as hypertension, diabetes, thyroid disease, or abnormal heart rhythms as directed by your health care provider.  Learn to manage stress.  Plan rest periods when fatigued.  Learn strategies to manage high temperatures. If the weather is extremely hot:  Avoid vigorous physical activity.  Use air conditioning or fans or seek a cooler location.  Avoid caffeine and alcohol.  Wear loose-fitting, lightweight, and light-colored clothing.  Learn strategies to manage cold temperatures. If the weather is extremely cold:  Avoid vigorous physical activity.  Layer clothes.  Wear mittens or gloves, a hat, and a scarf when going outside.  Avoid alcohol.  Obtain ongoing education and support as needed.  Participate in or seek rehabilitation as needed to maintain or improve independence and quality of life. SEEK MEDICAL CARE IF:   Your weight increases by 03 lb/1.4 kg in 1 day or 05 lb/2.3 kg in a week.  You have increasing shortness of breath that is unusual for you.  You are unable to participate in  your usual physical activities.  You tire easily.  You cough more than normal, especially with physical activity.  You have any or more swelling in areas such as your hands, feet, ankles, or abdomen.  You are unable to sleep because it is hard to breathe.  You feel like your heart is beating fast (palpitations).  You become dizzy or light-headed upon standing up. SEEK IMMEDIATE MEDICAL CARE IF:   You have difficulty breathing.  There is a change in mental status such as decreased alertness or difficulty with concentration.  You have a pain or discomfort in your chest.  You have an episode of fainting (syncope). MAKE SURE YOU:   Understand these instructions.  Will watch your condition.  Will get help right away if you are not doing well or get worse. Document Released: 12/11/2005 Document Revised: 04/27/2014 Document Reviewed: 01/10/2013 [facility] Patient Information 2015 [place], [place]. This information is not intended to replace advice given to you by your health care provider. Make sure you discuss any questions you have with your health care provider. Pneumonia Pneumonia is an infection of the lungs.  CAUSES Pneumonia may be caused by bacteria or a virus. Usually, these infections are caused by breathing infectious particles into the lungs (respiratory tract). SIGNS AND SYMPTOMS  Cough.  Fever.  Chest pain.  Increased rate of breathing.  Wheezing.  Mucus production. DIAGNOSIS  If you have the common symptoms of pneumonia, your health care provider will typically confirm the diagnosis with a chest X-ray. The X-ray will show an abnormality in the lung (pulmonary infiltrate) if you have pneumonia. Other tests of your blood, urine, or sputum may be done to find the specific cause of your pneumonia. Your health care provider may also do tests (blood gases or pulse oximetry) to see how well your lungs are working. TREATMENT  Some forms of pneumonia may be spread to  other people when you cough or sneeze. You may be asked to wear a mask before and during your exam. Pneumonia that is caused by bacteria is treated with antibiotic medicine. Pneumonia that is caused by the influenza virus may be treated with an antiviral medicine. Most other viral infections must run their course. These infections will not respond to antibiotics.  HOME CARE INSTRUCTIONS   Cough suppressants may be used if you are losing too much rest. However, coughing protects you by clearing your lungs. You should avoid using cough suppressants if you can.  Your health care provider may have prescribed medicine if he or she thinks your pneumonia is caused by bacteria or influenza. Finish your medicine even if you start to feel better.  Your health care provider may also prescribe an expectorant. This loosens the mucus to be coughed up.  Take medicines only as directed by your health care provider.  Do not smoke. Smoking is a common cause of bronchitis and can contribute to pneumonia. If you are a smoker and continue to smoke, your cough may last several weeks after your pneumonia has cleared.  A cold steam vaporizer or humidifier in your room or home may help loosen mucus.  Coughing is often worse at night. Sleeping in a semi-upright position in a recliner or using a couple pillows under your head will help with this.  Get rest as you feel it is needed. Your body will usually let you know when you need to rest. PREVENTION A pneumococcal shot (vaccine) is available to prevent a common bacterial cause of pneumonia. This is usually suggested for:  People over 10 years old.  Patients on chemotherapy.  People with chronic lung problems, such as bronchitis or emphysema.  People with immune system problems. If you are over 65 or have a high risk condition, you may receive the pneumococcal vaccine if you have not received it before. In some countries, a routine influenza vaccine is also  recommended. This vaccine can help prevent some cases of pneumonia.You may be offered the influenza vaccine as part of your care. If you smoke, it is time to quit. You may receive instructions on how to stop smoking. Your health care provider can provide medicines and counseling to help you quit. SEEK MEDICAL CARE IF: You have a fever. SEEK IMMEDIATE MEDICAL CARE IF:   Your illness becomes worse. This is especially true if you are elderly or weakened from any other disease.  You cannot control your cough with suppressants and are losing sleep.  You begin coughing up blood.  You develop pain which is getting worse or is uncontrolled with medicines.  Any of the symptoms which initially brought you in for treatment are getting worse rather than better.  You develop shortness of breath or chest pain. MAKE SURE YOU:   Understand these instructions.  Will watch your condition.  Will  get help right away if you are not doing well or get worse. °Document Released: 12/11/2005 Document Revised: 04/27/2014 Document Reviewed: 03/02/2011 °[employer]® Patient Information ©2015 [employer]. This information is not intended to replace advice given to you by your health care provider. Make sure you discuss any questions you have with your health care provider. ° °

## 2014-12-06 NOTE — Progress Notes (Signed)
Medicare Important Message given? YES  (If response is "NO", the following Medicare IM given date fields will be blank)  Date Medicare IM given:  12/06/2014 Medicare IM given by: Jonnie Finner

## 2014-12-06 NOTE — Discharge Summary (Addendum)
Physician Discharge Summary  Rachel Copeland IOE:[id number] DOB: 11/11/1929 DOA: 12/01/2014  PCP: Elyn Peers, MD  Admit date: 12/01/2014 Discharge date: 12/06/2014  Recommendations for Outpatient Follow-up:  1. Please note we increased lasix to 40 mg daily. It may further be increased by your cardiologist if kidney function remains stable. Have your PCP or cardiology office recheck kidney function during next appointment.  2. Please stop actos since it may contribute to congestive heart failure. We started glipizide 10 mg daily.  3. Take Levaquin every other day for 4 more doses for pneumonia.  Discharge Diagnoses:  Principal Problem:   Acute respiratory failure with hypoxia Active Problems:   Diabetes mellitus type 2, controlled   CAP (community acquired pneumonia)   HTN (hypertension)   Dyslipidemia   Chronic diastolic CHF (congestive heart failure)   Anemia of chronic disease   Thrombocytopenia   Chronic kidney disease (CKD), stage IV (severe)   Bilateral pneumonia    Discharge Condition: stable   Diet recommendation: as tolerated   History of present illness:  80 y.o. female with history of diabetes mellitus, chronic diastolic CHF (no previous 2 D ECHO on file), hypertension, dyslipidemia, CKD stage 4 who presented to [facility] ED with progressively worsening shortness of breath with cough productive of yellowish sputum for past few days prior to this admission. Patient also had and episode of nausea and vomiting but this has now subsided. In ED, pt found to be hypoxic with O2 sat 68% on room air. She has improved with [place] oxygen support. CXR showed right upper lobe, right lower lobe and left lower lobe airspace disease most concerning for multilobar pneumonia. Additionally, her 12 lead EKG showed LBBB but no old EKG for comparison. No complaints of chest pain. Troponin levels were all negative total of 3 sets. She was given lasix 40 mg IV in ED and then continued of lasix 20 mg  daily. Since she had some weight gain cardio recommended IV lasix.   Assessment/Plan:     Principal Problem:  Acute respiratory failure with hypoxia / multilobar pneumonia / community acquired pneumonia - hypoxia secondary to multilobar pneumonia. CXR on admission showed right upper lobe, right lower lobe and left lower lobe airspace disease most concerning for multilobar pneumonia. - pt started on azithromycin and rocephin. She feels better. Repeat CXR showed improvement. She wants to go home today. D/C with Levaquin for 4 more doses on discharge every 48 hours. - Strep pneumoniae is negative, influenza is negative and blood cultures so far negative. Legionella is negative.   Active Problems:  Diabetes mellitus type 2, controlled - A1c on this admission is 7.9 slightly above the goal A1c for diabetics.  - we stopped Actos because it could contribute to worsening congestive heart failure. DM coordinator recommended tradjenta but hospitalist cant prescribe this medication, PCP has to. We will prescribe glipizide until patient has a chance to follow up with PCP   HTN (hypertension) - continue Norvasc, coreg and lasix   Dyslipidemia - continue vytorin and omega 3   Acute on chronic diastolic CHF (congestive heart failure) / LBBB on EKG - Repeat 12-lead EKG with left bundle branch block. No complaints of chest pain. Troponin levels within normal limits.  - BNP only mildly elevated at 576. Given lasix 40 mg IV once in ED. Then on lasix 20 mg daily PO. Since she had weight gain in past 24 hours, cardio recommended IV lasix. On discharge will switch to lasix 40 mg daily PO. -  weight trend since admission: 225 lbs --> 228 lbs --> 224 lbs - Continue coreg - Continue an aspirin daily - Replete electrolytes as needed.   Hypokalemia - Due to lasix - Repleted    Anemia of chronic disease - Secondary to history of CKD - Hemoglobin is 10.2 - no current indications for  transfusion    Thrombocytopenia - this is a chronic problem, unclear etiology - using SCD's for DVT prophylaxis    Chronic kidney disease (CKD), stage IV (severe) - baseline creatinine in 2.4 range and on this admission 1.18 - 1.30. We temporarily held Diovan to see if kidney function improves. Please note that kidney function however better than baseline and pt was previously on this medication.   DVT Prophylaxis  - SCD's bilaterally   Code Status: Full.  Family Communication: plan of care discussed with the patient    IV access:   Peripheral IV  Procedures and diagnostic studies:   Dg Chest Port 1 View 12/03/2014 mprovement in aeration. No residual infiltrate or pulmonary edema. Persistent mild right hilar and right paratracheal prominence probable vascular in nature.   Dg Chest Portable 1 View 12/01/2014 Right upper lobe, right lower lobe and left lower lobe airspace disease most concerning for multilobar pneumonia.   Medical Consultants:   Cardiology  Other Consultants:   PT/OT  IAnti-Infectives:    Azithromycin 12/01/2014 --> 12/06/2014  Rocephin 12/01/2014 --> 12/06/2014     Signed:  Leisa Lenz, MD  [facility] 12/06/2014, 12:01 PM  Pager #: [phone]   Discharge Exam: Filed Vitals:   12/06/14 0521  BP: 170/63  Pulse: 66  Temp: 97.8 F (36.6 C)  Resp: 18   Filed Vitals:   12/05/14 0501 12/05/14 1430 12/05/14 2110 12/06/14 0521  BP: 130/47 141/56 152/52 170/63  Pulse: 64 60 59 66  Temp: 98.5 F (36.9 C) 97.5 F (36.4 C) 98.1 F (36.7 C) 97.8 F (36.6 C)  TempSrc: Oral Oral Oral Oral  Resp: 18 16 18 18   Height:      Weight: 103.6 kg (228 lb 6.3 oz)   101.606 kg (224 lb)  SpO2: 100% 99% 99% 99%    General: Pt is alert, follows commands appropriately, not in acute distress Cardiovascular: Regular rate and rhythm, S1/S2 appreciated  Respiratory: no wheezing, no crackles, no  rhonchi Abdominal: Soft, non tender, non distended, bowel sounds +, no guarding Extremities: trace pitting pedal edema, no cyanosis, pulses palpable bilaterally DP and PT Neuro: Grossly nonfocal  Discharge Instructions  Discharge Instructions    Call MD for:  difficulty breathing, headache or visual disturbances    Complete by:  As directed      Call MD for:  hives    Complete by:  As directed      Call MD for:  persistant dizziness or light-headedness    Complete by:  As directed      Call MD for:  persistant nausea and vomiting    Complete by:  As directed      Call MD for:  severe uncontrolled pain    Complete by:  As directed      Diet - low sodium heart healthy    Complete by:  As directed      Discharge instructions    Complete by:  As directed   1. Please note we increased lasix to 40 mg daily. It may further be increased by your cardiologist if kidney function remains stable. Have your PCP or cardiology office recheck kidney  function during next appointment.  2. Please stop actos since it may contribute to congestive heart failure. We started glipizide 10 mg daily.  3. Take Levaquin every other day for 4 more doses for pneumonia.     Increase activity slowly    Complete by:  As directed             Medication List    STOP taking these medications        pioglitazone 45 MG tablet  Commonly known as:  ACTOS      TAKE these medications        amLODipine 10 MG tablet  Commonly known as:  NORVASC  Take 10 mg by mouth daily.     aspirin 81 MG tablet  Take 81 mg by mouth daily.     calcium-vitamin D 500-200 MG-UNIT per tablet  Commonly known as:  OSCAL WITH D  Take 1 tablet by mouth every morning.     carvedilol 25 MG tablet  Commonly known as:  COREG  Take 25 mg by mouth 2 (two) times daily with a meal.     ezetimibe-simvastatin 10-20 MG per tablet  Commonly known as:  VYTORIN  Take 1 tablet by mouth at bedtime.     fish oil-omega-3 fatty acids 1000 MG  capsule  Take 1 g by mouth every morning.     furosemide 40 MG tablet  Commonly known as:  LASIX  Take 1 tablet (40 mg total) by mouth every morning.     glipiZIDE 10 MG tablet  Commonly known as:  GLUCOTROL  Take 1 tablet (10 mg total) by mouth daily before breakfast.     levofloxacin 750 MG tablet  Commonly known as:  LEVAQUIN  Take 1 tablet (750 mg total) by mouth every other day.     potassium chloride 10 MEQ tablet  Commonly known as:  K-DUR  Take 1 tablet (10 mEq total) by mouth daily.     valsartan-hydrochlorothiazide 320-25 MG per tablet  Commonly known as:  DIOVAN-HCT  Take 1 tablet by mouth daily.     vitamin C 1000 MG tablet  Take 1,000 mg by mouth every morning.           The results of significant diagnostics from this hospitalization (including imaging, microbiology, ancillary and laboratory) are listed below for reference.    Significant Diagnostic Studies: Dg Chest Port 1 View  12/03/2014   CLINICAL DATA:  Followup pneumonia  EXAM: PORTABLE CHEST - 1 VIEW  COMPARISON:  12/01/2014  FINDINGS: Cardiomegaly again noted. There is improvement in aeration. Previous right infiltrates has cleared. Persistent mild right hilar and right paratracheal prominence probable vascular in nature. No pulmonary edema  IMPRESSION: Improvement in aeration. No residual infiltrate or pulmonary edema. Persistent mild right hilar and right paratracheal prominence probable vascular in nature.   Electronically Signed   By: Lahoma Crocker M.D.   On: 12/03/2014 10:49   Dg Chest Portable 1 View  12/01/2014   CLINICAL DATA:  Abdominal pain, nauseated, diarrhea for 1 day, no chest pain  EXAM: PORTABLE CHEST - 1 VIEW  COMPARISON:  10/03/2009  FINDINGS: Right upper lobe, right lower lobe and left lower lobe airspace disease. Bilateral mild interstitial thickening. No pleural effusion or pneumothorax. Stable cardiomediastinal silhouette. No acute osseous abnormality.  IMPRESSION: Right upper lobe,  right lower lobe and left lower lobe airspace disease most concerning for multilobar pneumonia.   Electronically Signed   By: Kathreen Devoid   On:  12/01/2014 20:07    Microbiology: Recent Results (from the past 240 hour(s))  Culture, blood (routine x 2)     Status: None (Preliminary result)   Collection Time: 12/01/14  8:40 PM  Result Value Ref Range Status   Specimen Description BLOOD RIGHT HAND  Final   Special Requests BOTTLES DRAWN AEROBIC AND ANAEROBIC 3ML  Final   Culture  Setup Time   Final    12/02/2014 00:57 Performed at [facility]    Culture   Final           BLOOD CULTURE RECEIVED NO GROWTH TO DATE CULTURE WILL BE HELD FOR 5 DAYS BEFORE ISSUING A FINAL NEGATIVE REPORT Performed at [facility]    Report Status PENDING  Incomplete  Culture, blood (routine x 2)     Status: None (Preliminary result)   Collection Time: 12/01/14  8:40 PM  Result Value Ref Range Status   Specimen Description BLOOD RIGHT WRIST  Final   Special Requests BOTTLES DRAWN AEROBIC ONLY 3ML  Final   Culture  Setup Time   Final    12/02/2014 00:57 Performed at [facility]    Culture   Final           BLOOD CULTURE RECEIVED NO GROWTH TO DATE CULTURE WILL BE HELD FOR 5 DAYS BEFORE ISSUING A FINAL NEGATIVE REPORT Performed at [facility]    Report Status PENDING  Incomplete     Labs: Basic Metabolic Panel:  Recent Labs Lab 12/01/14 1927 12/01/14 2034 12/02/14 0520 12/03/14 0400 12/04/14 0350 12/05/14 0428  NA 140 142 140 140 138 142  K 3.4* 3.3* 3.1* 3.3* 3.7 3.7  CL 100 104 101 103 103 104  CO2 26  --  26 27 25 28   GLUCOSE 207* 209* 117* 108* 93 126*  BUN 22 23 23  26* 28* 31*  CREATININE 1.10 1.20* 1.27* 1.30* 1.18* 1.24*  CALCIUM 10.4  --  9.4 9.1 9.2 9.3   Liver Function Tests:  Recent Labs Lab 12/01/14 2202 12/02/14 0520  AST 25 18  ALT 16 12  ALKPHOS 60 49  BILITOT 0.4 0.3  PROT 8.2 6.8  ALBUMIN 3.9 3.1*    Recent Labs Lab  12/01/14 2202  LIPASE 14   No results for input(s): AMMONIA in the last 168 hours. CBC:  Recent Labs Lab 12/01/14 1927 12/01/14 2034 12/02/14 0520 12/03/14 0400  WBC 6.5  --  5.8 4.2  NEUTROABS  --   --  4.1  --   HGB 11.5* 13.9 10.2* 9.7*  HCT 35.4* 41.0 31.1* 30.5*  MCV 80.6  --  81.0 81.8  PLT 116*  --  117* 100*   Cardiac Enzymes:  Recent Labs Lab 12/01/14 1927 12/01/14 2355 12/02/14 0520  TROPONINI <0.30 <0.30 <0.30   BNP: BNP (last 3 results)  Recent Labs  12/01/14 1928  PROBNP 576.4*   CBG:  Recent Labs Lab 12/05/14 0729 12/05/14 1220 12/05/14 1635 12/05/14 2059 12/06/14 0801  GLUCAP 120* 134* 139* 122* 120*    Time coordinating discharge: Over 30 minutes

## 2014-12-06 NOTE — Progress Notes (Signed)
CARE MANAGEMENT NOTE 12/06/2014  Patient:  Rachel Copeland, Rachel Copeland   Account Number:  [id number]  Date Initiated:  12/02/2014  Documentation initiated by:  [facility]  Subjective/Objective Assessment:   80 Y/O F ADMITTED W/ACUTE RESP FAILURE/PNA.     Action/Plan:   FROM HOME ALONE.   Anticipated DC Date:  12/04/2014   Anticipated DC Plan:  [place]  CM consult      Choice offered to / List presented to:  C-1 Patient           Status of service:  Completed, signed off Medicare Important Message given?  YES (If response is "NO", the following Medicare IM given date fields will be blank) Date Medicare IM given:  12/04/2014 Medicare IM given by:  [facility] Date Additional Medicare IM given:  12/06/2014 Additional Medicare IM given by:  Jonnie Finner  Discharge Disposition:  HOME/SELF CARE  Per UR Regulation:  Reviewed for med. necessity/level of care/duration of stay  If discussed at [place] of Stay Meetings, dates discussed:    Comments:   12/06/2104 1330 NCM spoke to pt and no HH PT recommended. States she did not need RW at this time. Jonnie Finner RN CCM Case Mgmt phone [phone]   12/04/14 Dessa Phi RN BSN NCM [phone] Patient initially chose [facility] for [facility], they are not in insurance, then patient chose [facility] tc kristen aware of d/c & hhc recommendations.Await final hhc orders.  12/03/14 Dessa Phi RN BSN NCM [phone] PT-HHPT.Provided patient w/HHC agency list, await choice.await final HHPT order, & face to face.  12/02/14 KATHY MAHABIR RN,BSN NCM [phone] RECOMMEND PT CONS WHEN APPROPRIATE.

## 2014-12-06 NOTE — Progress Notes (Signed)
Patient ID: Rachel Copeland, female   DOB: 21-Jun-1929, 80 y.o.   MRN: [id number]  I/O is negative. Following at a distance.  Daryel November, MD

## 2014-12-08 LAB — CULTURE, BLOOD (ROUTINE X 2)
CULTURE: NO GROWTH
Culture: NO GROWTH

## 2015-01-02 ENCOUNTER — Other Ambulatory Visit: Payer: Self-pay | Admitting: Internal Medicine

## 2015-03-04 DIAGNOSIS — I10 Essential (primary) hypertension: Secondary | ICD-10-CM | POA: Diagnosis not present

## 2015-03-04 DIAGNOSIS — J398 Other specified diseases of upper respiratory tract: Secondary | ICD-10-CM | POA: Diagnosis not present

## 2015-03-04 DIAGNOSIS — E119 Type 2 diabetes mellitus without complications: Secondary | ICD-10-CM | POA: Diagnosis not present

## 2015-05-04 DIAGNOSIS — Z0000 Encounter for general adult medical examination without abnormal findings: Secondary | ICD-10-CM | POA: Diagnosis not present

## 2015-06-04 DIAGNOSIS — R7309 Other abnormal glucose: Secondary | ICD-10-CM | POA: Diagnosis not present

## 2015-06-04 DIAGNOSIS — I10 Essential (primary) hypertension: Secondary | ICD-10-CM | POA: Diagnosis not present

## 2015-08-06 ENCOUNTER — Other Ambulatory Visit: Payer: Self-pay

## 2015-08-06 DIAGNOSIS — Z1231 Encounter for screening mammogram for malignant neoplasm of breast: Secondary | ICD-10-CM

## 2015-08-25 ENCOUNTER — Ambulatory Visit
Admission: RE | Admit: 2015-08-25 | Discharge: 2015-08-25 | Disposition: A | Discharge status: Home / Self Care | Payer: Medicaid Other | Source: Ambulatory Visit

## 2015-08-25 DIAGNOSIS — Z1231 Encounter for screening mammogram for malignant neoplasm of breast: Secondary | ICD-10-CM

## 2015-10-07 DIAGNOSIS — E119 Type 2 diabetes mellitus without complications: Secondary | ICD-10-CM | POA: Diagnosis not present

## 2015-10-07 DIAGNOSIS — Z961 Presence of intraocular lens: Secondary | ICD-10-CM | POA: Diagnosis not present

## 2015-12-15 IMAGING — MG MM DIGITAL SCREENING BILAT
6 series · 6 of 6 positions shown · non-contrast
Comparison: Previous exam(s).

CLINICAL DATA: Screening.

EXAM:
DIGITAL SCREENING BILATERAL MAMMOGRAM WITH CAD

[L CC]
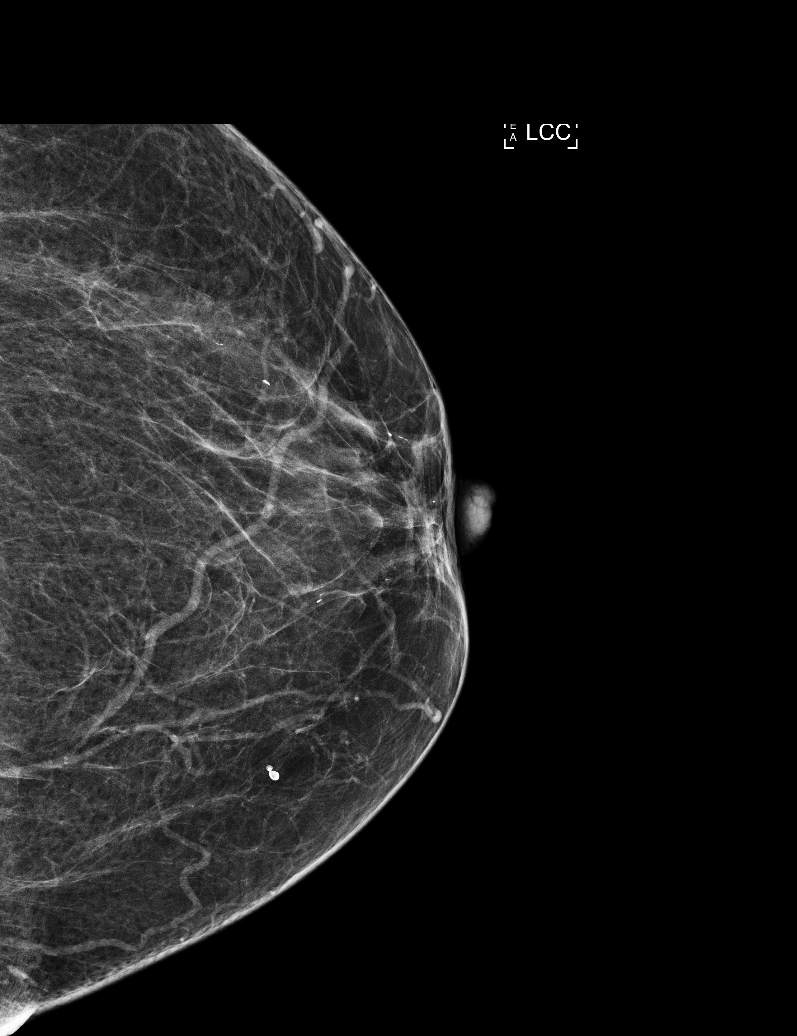

[L XCCL]
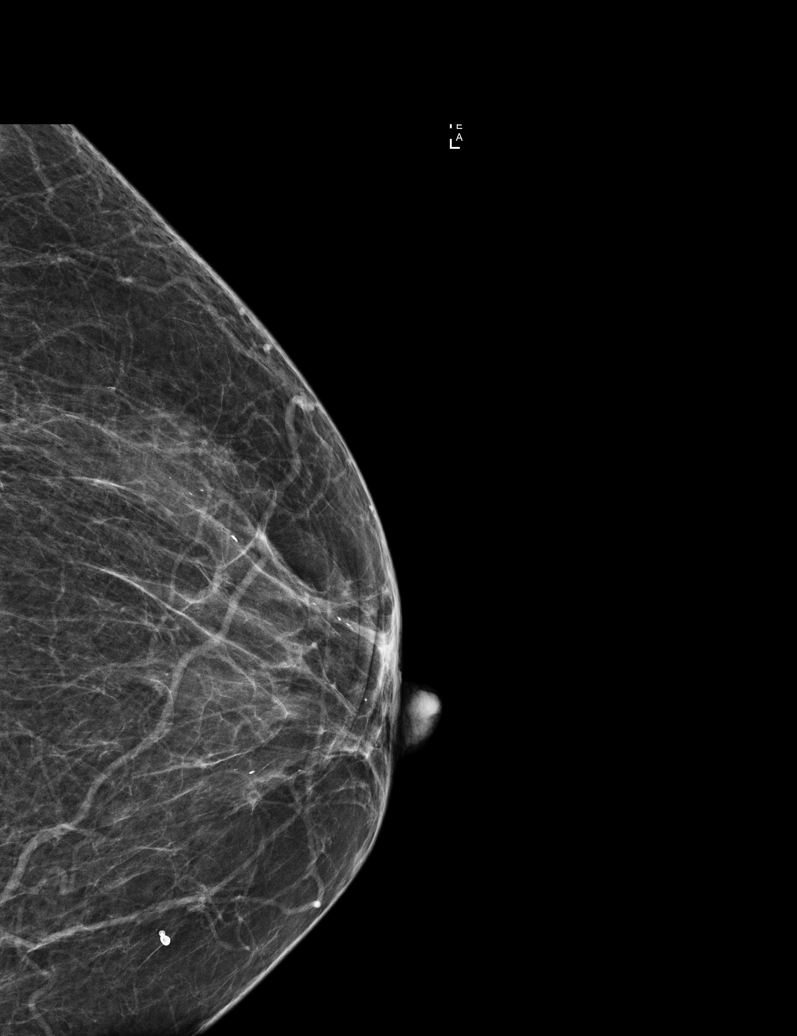

[R MLO (1 of 2)]
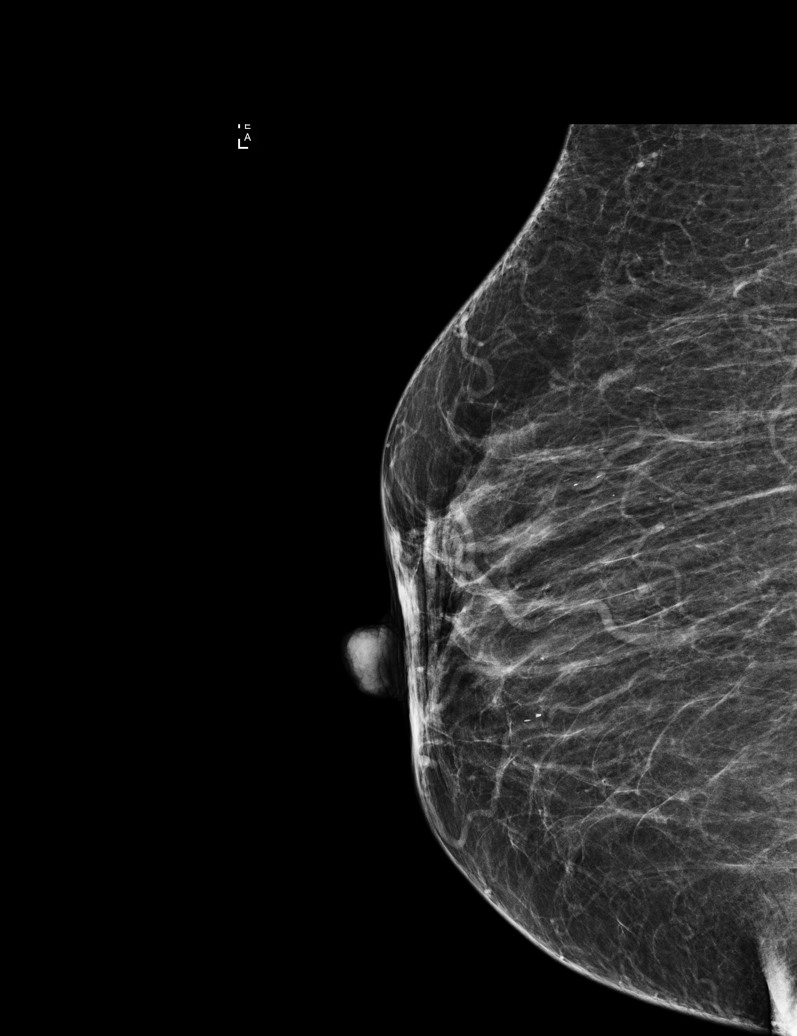

[R MLO (2 of 2)]
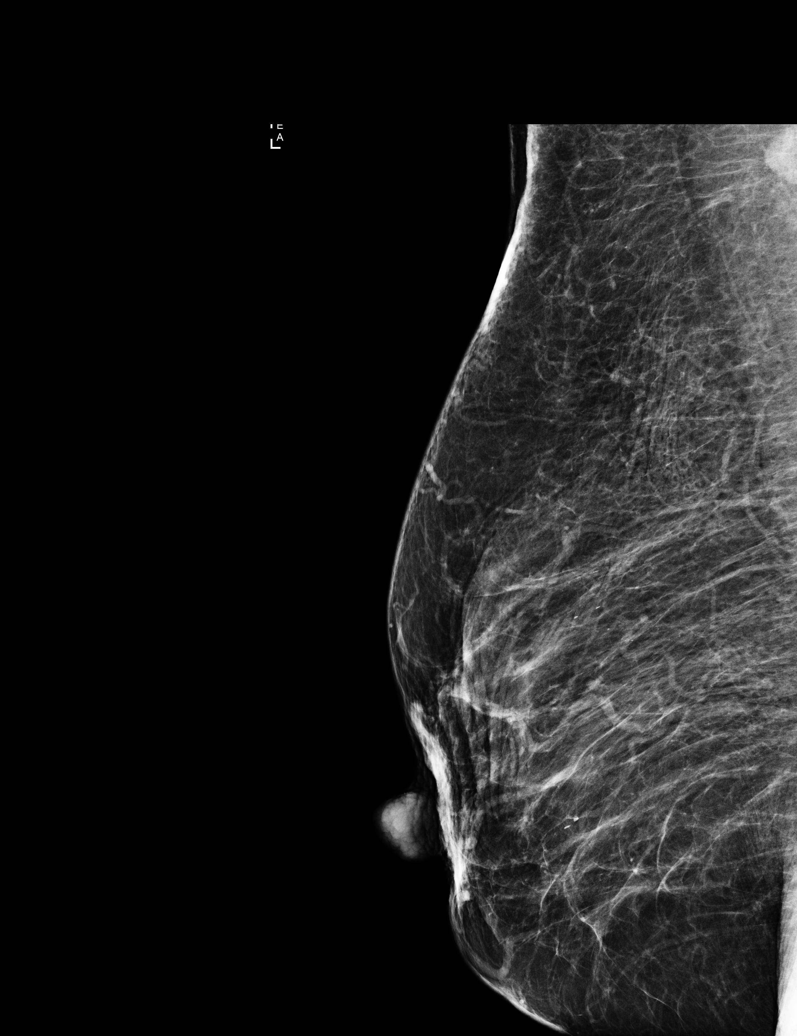

[L MLO]
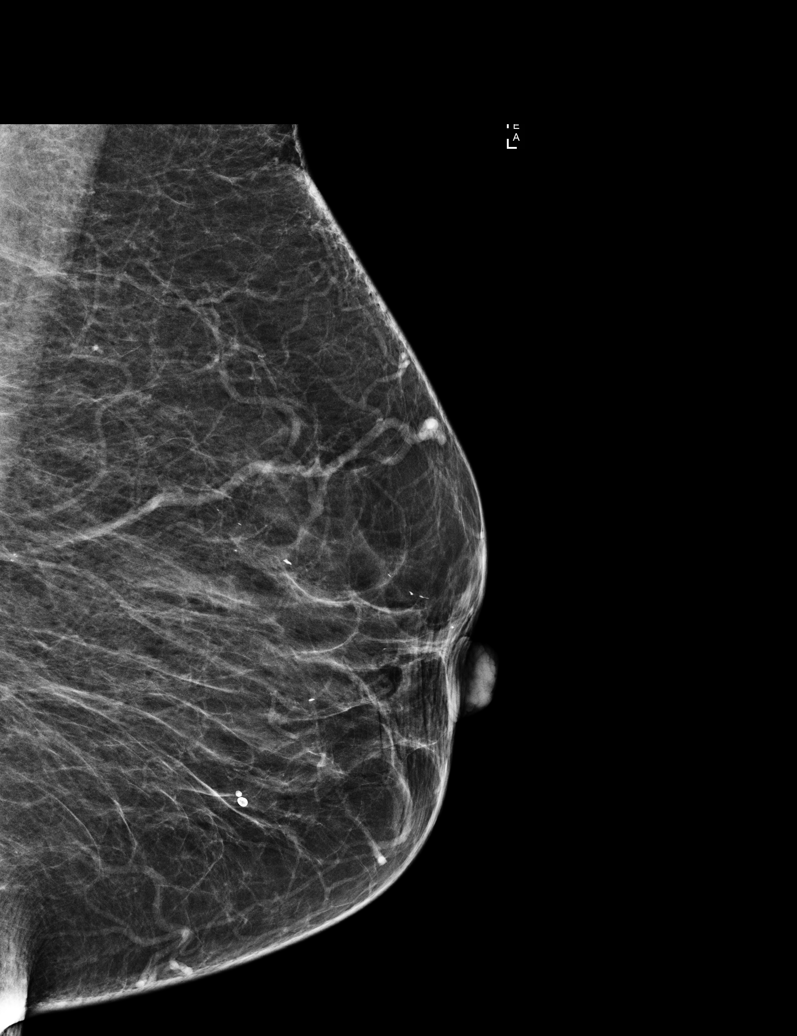

[R CC]
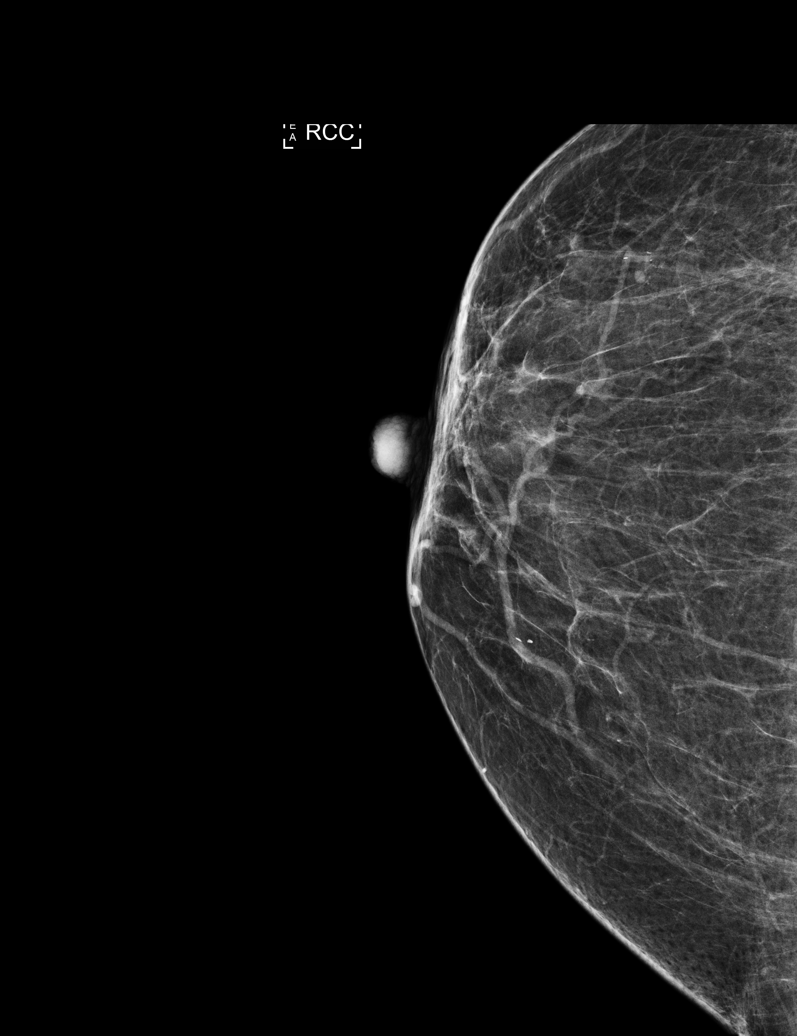

[6 of 6 positions shown; findings below may reference images not displayed]

ACR Breast Density Category b: There are scattered areas of
fibroglandular density.
FINDINGS: There are no findings suspicious for malignancy. Images were
processed with CAD.
IMPRESSION: No mammographic evidence of malignancy. A result letter of this
screening mammogram will be mailed directly to the patient.

RECOMMENDATION:
Screening mammogram in one year. (Code:AS-G-LCT)

BI-RADS CATEGORY  1: Negative.

## 2016-01-11 DIAGNOSIS — I10 Essential (primary) hypertension: Secondary | ICD-10-CM | POA: Diagnosis not present

## 2016-01-11 DIAGNOSIS — M1990 Unspecified osteoarthritis, unspecified site: Secondary | ICD-10-CM | POA: Diagnosis not present

## 2016-01-11 DIAGNOSIS — E782 Mixed hyperlipidemia: Secondary | ICD-10-CM | POA: Diagnosis not present

## 2016-01-11 DIAGNOSIS — E119 Type 2 diabetes mellitus without complications: Secondary | ICD-10-CM | POA: Diagnosis not present

## 2016-02-09 DIAGNOSIS — E119 Type 2 diabetes mellitus without complications: Secondary | ICD-10-CM | POA: Diagnosis not present

## 2016-02-09 DIAGNOSIS — J399 Disease of upper respiratory tract, unspecified: Secondary | ICD-10-CM | POA: Diagnosis not present

## 2016-02-09 DIAGNOSIS — E782 Mixed hyperlipidemia: Secondary | ICD-10-CM | POA: Diagnosis not present

## 2016-02-09 DIAGNOSIS — I10 Essential (primary) hypertension: Secondary | ICD-10-CM | POA: Diagnosis not present

## 2016-06-20 DIAGNOSIS — E782 Mixed hyperlipidemia: Secondary | ICD-10-CM | POA: Diagnosis not present

## 2016-06-20 DIAGNOSIS — E119 Type 2 diabetes mellitus without complications: Secondary | ICD-10-CM | POA: Diagnosis not present

## 2016-06-20 DIAGNOSIS — I10 Essential (primary) hypertension: Secondary | ICD-10-CM | POA: Diagnosis not present

## 2016-06-20 DIAGNOSIS — E089 Diabetes mellitus due to underlying condition without complications: Secondary | ICD-10-CM | POA: Diagnosis not present

## 2016-06-20 DIAGNOSIS — Z0000 Encounter for general adult medical examination without abnormal findings: Secondary | ICD-10-CM | POA: Diagnosis not present

## 2016-07-21 ENCOUNTER — Other Ambulatory Visit: Payer: Self-pay | Admitting: Family Medicine

## 2016-07-21 DIAGNOSIS — Z1231 Encounter for screening mammogram for malignant neoplasm of breast: Secondary | ICD-10-CM

## 2016-09-04 ENCOUNTER — Ambulatory Visit
Admission: RE | Admit: 2016-09-04 | Discharge: 2016-09-04 | Disposition: A | Discharge status: Home / Self Care | Payer: Medicare Other | Source: Ambulatory Visit | Attending: Family Medicine | Admitting: Family Medicine

## 2016-09-04 DIAGNOSIS — Z1231 Encounter for screening mammogram for malignant neoplasm of breast: Secondary | ICD-10-CM

## 2016-11-28 DIAGNOSIS — E119 Type 2 diabetes mellitus without complications: Secondary | ICD-10-CM | POA: Diagnosis not present

## 2016-11-28 DIAGNOSIS — M1990 Unspecified osteoarthritis, unspecified site: Secondary | ICD-10-CM | POA: Diagnosis not present

## 2016-11-28 DIAGNOSIS — I10 Essential (primary) hypertension: Secondary | ICD-10-CM | POA: Diagnosis not present

## 2017-06-12 DIAGNOSIS — G3184 Mild cognitive impairment, so stated: Secondary | ICD-10-CM | POA: Diagnosis not present

## 2017-06-12 DIAGNOSIS — I10 Essential (primary) hypertension: Secondary | ICD-10-CM | POA: Diagnosis not present

## 2017-06-12 DIAGNOSIS — E119 Type 2 diabetes mellitus without complications: Secondary | ICD-10-CM | POA: Diagnosis not present

## 2017-06-12 DIAGNOSIS — M1990 Unspecified osteoarthritis, unspecified site: Secondary | ICD-10-CM | POA: Diagnosis not present

## 2017-06-19 ENCOUNTER — Telehealth: Payer: Self-pay | Admitting: Gastroenterology

## 2017-06-19 NOTE — Telephone Encounter (Signed)
AUG RECALL FOR TCS

## 2017-06-19 NOTE — Telephone Encounter (Signed)
Letter mailed to pt.  

## 2017-07-05 ENCOUNTER — Telehealth: Payer: Self-pay

## 2017-07-05 NOTE — Telephone Encounter (Signed)
Patient received letter from DS to schedule colonoscopy. She lives in [place] and wanted a GI doctor closer to home. I told her to call her PCP and have them recommend a GI in [place].

## 2017-07-05 NOTE — Telephone Encounter (Signed)
Noted  

## 2017-07-13 DIAGNOSIS — E782 Mixed hyperlipidemia: Secondary | ICD-10-CM | POA: Diagnosis not present

## 2017-07-13 DIAGNOSIS — I10 Essential (primary) hypertension: Secondary | ICD-10-CM | POA: Diagnosis not present

## 2017-07-13 DIAGNOSIS — G3184 Mild cognitive impairment, so stated: Secondary | ICD-10-CM | POA: Diagnosis not present

## 2017-08-20 ENCOUNTER — Other Ambulatory Visit: Payer: Self-pay | Admitting: Family Medicine

## 2017-08-20 DIAGNOSIS — Z1239 Encounter for other screening for malignant neoplasm of breast: Secondary | ICD-10-CM

## 2017-08-31 ENCOUNTER — Encounter: Payer: Self-pay | Admitting: Gastroenterology

## 2017-09-05 ENCOUNTER — Ambulatory Visit
Admission: RE | Admit: 2017-09-05 | Discharge: 2017-09-05 | Disposition: A | Discharge status: Home / Self Care | Payer: Medicare Other | Source: Ambulatory Visit | Attending: Family Medicine | Admitting: Family Medicine

## 2017-09-05 DIAGNOSIS — Z1231 Encounter for screening mammogram for malignant neoplasm of breast: Secondary | ICD-10-CM | POA: Diagnosis not present

## 2017-09-05 DIAGNOSIS — Z1239 Encounter for other screening for malignant neoplasm of breast: Secondary | ICD-10-CM

## 2017-09-11 ENCOUNTER — Ambulatory Visit
Admission: RE | Admit: 2017-09-11 | Discharge: 2017-09-11 | Disposition: A | Discharge status: Home / Self Care | Payer: Medicare Other | Source: Ambulatory Visit | Attending: Family Medicine | Admitting: Family Medicine

## 2017-09-11 ENCOUNTER — Other Ambulatory Visit: Payer: Self-pay | Admitting: Family Medicine

## 2017-09-11 DIAGNOSIS — M47892 Other spondylosis, cervical region: Secondary | ICD-10-CM

## 2017-09-11 DIAGNOSIS — Z23 Encounter for immunization: Secondary | ICD-10-CM | POA: Diagnosis not present

## 2017-09-11 DIAGNOSIS — M1990 Unspecified osteoarthritis, unspecified site: Secondary | ICD-10-CM | POA: Diagnosis not present

## 2017-09-11 DIAGNOSIS — I10 Essential (primary) hypertension: Secondary | ICD-10-CM | POA: Diagnosis not present

## 2017-09-11 DIAGNOSIS — M47812 Spondylosis without myelopathy or radiculopathy, cervical region: Secondary | ICD-10-CM | POA: Diagnosis not present

## 2017-09-11 DIAGNOSIS — E119 Type 2 diabetes mellitus without complications: Secondary | ICD-10-CM | POA: Diagnosis not present

## 2017-10-31 ENCOUNTER — Ambulatory Visit (INDEPENDENT_AMBULATORY_CARE_PROVIDER_SITE_OTHER): Payer: Medicare Other | Admitting: Gastroenterology

## 2017-10-31 ENCOUNTER — Encounter: Payer: Self-pay | Admitting: Gastroenterology

## 2017-10-31 VITALS — BP 154/72 | HR 62 | Ht 64.0 in | Wt 199.0 lb

## 2017-10-31 DIAGNOSIS — Z800 Family history of malignant neoplasm of digestive organs: Secondary | ICD-10-CM

## 2017-10-31 DIAGNOSIS — R152 Fecal urgency: Secondary | ICD-10-CM | POA: Diagnosis not present

## 2017-10-31 DIAGNOSIS — R159 Full incontinence of feces: Secondary | ICD-10-CM

## 2017-10-31 NOTE — Patient Instructions (Signed)
You will be contacted with an appointment for your referral for physical therapy for biofeedback with Earlie Counts   Follow up as needed

## 2017-10-31 NOTE — Progress Notes (Signed)
Rachel Copeland    [id number]    [birth year]  Primary Care Physician:Bland, Myra Rude, MD  Referring Physician: Lucianne Lei, [address], [place] [address]  Chief complaint:  Fecal Incontinence, colorectal cancer screening  HPI:  83 year old female here for new patient visit to discuss colorectal cancer screening.  Patient received a recall letter stating she is due for colorectal cancer screening by [facility] : [facility].  She currently lives in [place] wanted to do the procedure here and schedule this visit to discuss.  Mother had colon cancer at age 24.  Last Colonoscopy August 06, 2007 Diminutive 4 mm transverse colon polyp and a 4 mm rectal polyp was removed with forceps, otherwise normal exam Pathology report showed polypoid fragment with benign colonic mucosa with prominent lymphoid aggregates, no adenomatous tissue  Patient denies any blood per rectum, abdominal pain, nausea, vomiting or change in bowel habits She has intermittent fecal incontinence sometimes with formed stool in her sleep or when she walks.    Outpatient Encounter Medications as of 10/31/2017  Medication Sig  . amLODipine (NORVASC) 10 MG tablet Take 10 mg by mouth daily.  . Ascorbic Acid (VITAMIN C) 1000 MG tablet Take 1,000 mg by mouth every morning.  Marland Kitchen aspirin 81 MG tablet Take 81 mg by mouth daily.  . calcium-vitamin D (OSCAL WITH D) 500-200 MG-UNIT per tablet Take 1 tablet by mouth every morning.  . carvedilol (COREG) 25 MG tablet Take 25 mg by mouth 2 (two) times daily with a meal.  . fish oil-omega-3 fatty acids 1000 MG capsule Take 1 g by mouth every morning.  . furosemide (LASIX) 40 MG tablet Take 1 tablet (40 mg total) by mouth every morning.  . Multiple Vitamins-Minerals (MULTIVITAMIN ADULT PO) Take by mouth.  . potassium chloride (K-DUR) 10 MEQ tablet Take 1 tablet (10 mEq total) by mouth daily.  . valsartan-hydrochlorothiazide (DIOVAN-HCT) 320-25 MG per tablet Take 1  tablet by mouth daily.  . [DISCONTINUED] ezetimibe-simvastatin (VYTORIN) 10-20 MG per tablet Take 1 tablet by mouth at bedtime.  . [DISCONTINUED] glipiZIDE (GLUCOTROL) 10 MG tablet Take 1 tablet (10 mg total) by mouth daily before breakfast.  . [DISCONTINUED] levofloxacin (LEVAQUIN) 750 MG tablet Take 1 tablet (750 mg total) by mouth every other day.   No facility-administered encounter medications on file as of 10/31/2017.     Allergies as of 10/31/2017 - Review Complete 10/31/2017  Allergen Reaction Noted  . Sitagliptin phosphate  04/09/2009    Past Medical History:  Diagnosis Date  . Diabetes mellitus    no medications  . Hyperlipidemia   . Hypertension     Past Surgical History:  Procedure Laterality Date  . NO PAST SURGERIES      Family History  Problem Relation Age of Onset  . Colon cancer Mother   . Colon polyps Mother   . Diabetes Mellitus II Brother   . Diabetes Mellitus II Daughter   . Breast cancer Neg Hx   . Stomach cancer Neg Hx     Social History   Socioeconomic History  . Marital status: Widowed    Spouse name: Not on file  . Number of children: 8  . Years of education: Not on file  . Highest education level: Not on file  Social Needs  . Financial resource strain: Not on file  . Food insecurity - worry: Not on file  . Food insecurity - inability: Not on file  .  Transportation needs - medical: Not on file  . Transportation needs - non-medical: Not on file  Occupational History  . Not on file  Tobacco Use  . Smoking status: Never Smoker  . Smokeless tobacco: Never Used  Substance and Sexual Activity  . Alcohol use: No  . Drug use: No  . Sexual activity: Not Currently    Partners: Male  Other Topics Concern  . Not on file  Social History Narrative  . Not on file      Review of systems: Review of Systems  Constitutional: Negative for fever and chills.  HENT: Negative.   Eyes: Negative for blurred vision.  Respiratory: Negative for  cough, shortness of breath and wheezing.   Cardiovascular: Negative for chest pain and palpitations.  Gastrointestinal: as per HPI Genitourinary: Negative for dysuria, urgency, frequency and hematuria.  Musculoskeletal: Negative for myalgias, back pain and joint pain.  Skin: Negative for itching and rash.  Neurological: Negative for dizziness, tremors, focal weakness, seizures and loss of consciousness.  Endo/Heme/Allergies: Positive for seasonal allergies.  Psychiatric/Behavioral: Negative for depression, suicidal ideas and hallucinations.  All other systems reviewed and are negative.   Physical Exam: Vitals:   10/31/17 1023  BP: (!) 154/72  Pulse: 62   Body mass index is 34.16 kg/m. Gen:      No acute distress HEENT:  EOMI, sclera anicteric Neck:     No masses; no thyromegaly Lungs:    Clear to auscultation bilaterally; normal respiratory effort CV:         Regular rate and rhythm; no murmurs Abd:      + bowel sounds; soft, non-tender; no palpable masses, no distension Ext:    No edema; adequate peripheral perfusion Skin:      Warm and dry; no rash Neuro: alert and oriented x 3 Psych: normal mood and affect Rectal exam: Decreased anal sphincter tone, no anal fissure or external hemorrhoids Anoscopy: Small internal hemorrhoids, no active bleeding, normal dentate line, no visible nodules  Data Reviewed:  Reviewed labs, radiology imaging, old records and pertinent past GI work up   Assessment and Plan/Recommendations:  83 year old female with family history of colon cancer in mother at age 45 here to discuss colorectal cancer screening Last colonoscopy in 2008 with removal of non-adenomatous polyps.  Given her age do not recommend continued screening for colorectal cancer Reassured patient and she is in agreement  Fecal incontinence in the setting of poor anal sphincter tone Patient is interested in undergoing physical therapy to improve sphincter tone, will refer for  pelvic floor PT  Return as needed   K. Denzil Magnuson , MD [phone] Mon-Fri 8a-5p [phone] after 5p, weekends, holidays  CC: Lucianne Lei, MD

## 2017-11-07 ENCOUNTER — Encounter: Payer: Self-pay | Admitting: Physical Therapy

## 2017-11-07 ENCOUNTER — Ambulatory Visit: Payer: Medicare Other | Attending: Gastroenterology | Admitting: Physical Therapy

## 2017-11-07 DIAGNOSIS — R278 Other lack of coordination: Secondary | ICD-10-CM | POA: Insufficient documentation

## 2017-11-07 DIAGNOSIS — M6281 Muscle weakness (generalized): Secondary | ICD-10-CM | POA: Insufficient documentation

## 2017-11-07 NOTE — Patient Instructions (Addendum)
Slow Contraction: Gravity Eliminated (Side-Lying)    Lie on left side, hips and knees slightly bent. Slowly squeeze pelvic floor for _2__ seconds. Rest for _5__ seconds. Repeat _10__ times. Do __4_ times a day.   Copyright  VHI. All rights reserved.  [facility] [address] [place], [place] [address] Phone # [phone] Fax [phone]

## 2017-11-07 NOTE — Therapy (Signed)
[facility] [address], [place], [place], [address] Phone: [phone]   Fax:  [phone]  Physical Therapy Evaluation  Patient Details  Name: Rachel Copeland MRN: [id number] Date of Birth: Apr 18, 1929 Referring Provider: Dr. Harl Bowie   Encounter Date: 11/07/2017  PT End of Session - 11/07/17 0920    Visit Number  1    Number of Visits  10    Date for PT Re-Evaluation  01/02/18    Authorization Type  medicare/medicaid; 10th visit need g-code    PT Start Time  0850    PT Stop Time  0925    PT Time Calculation (min)  35 min    Activity Tolerance  Patient tolerated treatment well    Behavior During Therapy  [facility] for tasks assessed/performed       Past Medical History:  Diagnosis Date  . Diabetes mellitus    no medications  . Hyperlipidemia   . Hypertension     Past Surgical History:  Procedure Laterality Date  . NO PAST SURGERIES      There were no vitals filed for this visit.   Subjective Assessment - 11/07/17 0855    Subjective  Fecal incontinence started last year.  Patient reports it started suddenly. Patient puts a pad on the rectal area to keep off her clothes.  She does not know when the fecal matter will come out.  Can be 3-4 times per day. Patient has 2-3 bowel movements per day. Bristol stool scale is 3-4.      Patient Stated Goals  reduce fecal leakage    Currently in Pain?  No/denies         [facility] PT Assessment - 11/07/17 0001      Assessment   Medical Diagnosis  R15.9, R15.2 Incontinence of feces with fecal urgency    Referring Provider  Dr. Harl Bowie    Onset Date/Surgical Date  05/07/17    Prior Therapy  None      Precautions   Precautions  None      Restrictions   Weight Bearing Restrictions  No      Balance Screen   Has the patient fallen in the past 6 months  No    Has the patient had a decrease in activity level because of a fear of falling?   No    Is  the patient reluctant to leave their home because of a fear of falling?   No      Home Environment   Living Environment  Private residence    Living Arrangements  Alone    Type of [place]      Prior Function   Level of [place]  Retired      Associate Professor   Overall Cognitive Status  Within Functional Limits for tasks assessed      Observation/Other Assessments   Focus on Therapeutic Outcomes (FOTO)   limited by 40% due to bowel movements when she does not want to  reduce by 20%      Posture/Postural Control   Posture/Postural Control  Postural limitations    Postural Limitations  Rounded Shoulders;Forward head      ROM / Strength   AROM / PROM / Strength  AROM;Strength;PROM      AROM   Overall AROM Comments  lumbar ROM is within functional limits      PROM   Right Hip External Rotation   30  Left Hip Flexion  90    Left Hip External Rotation   30      Strength   Overall Strength Comments  bil. knee strength is 5/5    Right Hip Flexion  4/5    Right Hip Extension  4-/5    Right Hip ABduction  4/5    Left Hip Flexion  4/5    Left Hip Extension  4-/5    Left Hip ABduction  4/5      Right Hip   Right Hip Flexion  90      Standardized Balance Assessment   Standardized Balance Assessment  Five Times Sit to Stand    Five times sit to stand comments   19 sec; no hands      Timed Up and Go Test   Normal TUG (seconds)  13    TUG Comments  no risk of falls             Objective measurements completed on examination: See above findings.    Pelvic Floor Special Questions - 11/07/17 0001    Urinary Leakage  No    Fecal incontinence  Yes 4-5 times per day    Pelvic Floor Internal Exam  Patient confirms identification and approves PT to assess anal sphincter muscle strength    Exam Type  Rectal    Palpation  trouble coordinating the sphincter contraction and needs verbal cues to breath    Strength  weak squeeze, no lift     Strength # of reps  3    Strength # of seconds  2               PT Education - 11/07/17 0919    Education provided  Yes    Education Details  anal spincter contraction    Person(s) Educated  Patient    Methods  Explanation;Demonstration;Handout;Verbal cues    Comprehension  Verbalized understanding;Returned demonstration       PT Short Term Goals - 11/07/17 0936      PT SHORT TERM GOAL #1   Title  independent with initial HEP    Time  4    Period  Weeks    Status  New    Target Date  12/05/17      PT SHORT TERM GOAL #2   Title  pelvic floor strength >/= 3/5 to reduce fecal leakage    Time  4    Period  Weeks    Status  New    Target Date  12/05/17      PT SHORT TERM GOAL #3   Title  sit to stand </= 16 sec with no hands    Time  4    Period  Weeks    Status  New    Target Date  12/05/17        PT Long Term Goals - 11/07/17 0938      PT LONG TERM GOAL #1   Title  independent with HEP and how to progress herself    Time  8    Period  Weeks    Status  New    Target Date  01/02/18      PT LONG TERM GOAL #2   Title  use </=1 pad per day due to decreased in fecal leakage    Time  8    Period  Weeks    Status  New    Target Date  01/02/18      PT  LONG TERM GOAL #3   Title  aware of fecal leakage due to increased sensation by 50%    Time  8    Period  Weeks    Status  New    Target Date  01/02/18      PT LONG TERM GOAL #4   Title  sit to stand </= 15 seconds so she is able to get up and down from the commode with greater ease    Time  8    Period  Weeks    Status  New    Target Date  01/02/18             Plan - 11/07/17 0925    Clinical Impression Statement  Patient is a 83 year old female with fecal leakage for the past year.  Patient will place a pad by the anal area due to fecal leakage happens unexpectantly and she does not feel it happening.  Pelvic floor anal strength is 2/5 and difficulty with coordinating her contraction with not  holding her breath.  Bilateral hip strength averages 4/5.  Timed up and go score is 13 seconds indicating low risk of falls and she is able to get to the bathroom in time. Sit to stand without hands time is 19 seconds which should be average 15 seconds for her age. Patient has decreased bilateral hip mobility with bilateral hip flexion PROM 90 degrees.   Bristol stool is 3-4 type. Patient will benefit from skilled therapy to improve pelvic floor strength so she is not soiling herself and causing skin break down.     History and Personal Factors relevant to plan of care:  Diabets; Hypertension; CHF    Clinical Presentation  Stable    Clinical Presentation due to:  stable condition    Clinical Decision Making  Low    Rehab Potential  Good    Clinical Impairments Affecting Rehab Potential  Diabets; Hypertension; CHF    PT Frequency  1x / week    PT Duration  8 weeks    PT Treatment/Interventions  Biofeedback;Therapeutic activities;Therapeutic exercise;Patient/family education;Neuromuscular re-education;Manual techniques    PT Next Visit Plan  pelvic floor EMG in sidely for contraction with ball    PT Home Exercise Plan  progress as needed    Consulted and Agree with Plan of Care  Patient       Patient will benefit from skilled therapeutic intervention in order to improve the following deficits and impairments:  Decreased strength, Decreased coordination  Visit Diagnosis: Muscle weakness (generalized) - Plan: PT plan of care cert/re-cert  Other lack of coordination - Plan: PT plan of care cert/re-cert  G-Codes - 16/10/96 0454    Functional Assessment Tool Used (Outpatient Only)  therapist discretion is 40% limitation due to fecal leakage    Functional Limitation  Other PT primary    Other PT Primary Current Status (U9811)  At least 40 percent but less than 60 percent impaired, limited or restricted    Other PT Primary Goal Status (B1478)  At least 20 percent but less than 40 percent impaired,  limited or restricted        Problem List Patient Active Problem List   Diagnosis Date Noted  . Bilateral pneumonia   . CAP (community acquired pneumonia) 12/02/2014  . HTN (hypertension) 12/02/2014  . Dyslipidemia 12/02/2014  . Chronic diastolic CHF (congestive heart failure) ([place]) 12/02/2014  . Anemia of chronic disease 12/02/2014  . Thrombocytopenia ([place]) 12/02/2014  . Chronic kidney  disease (CKD), stage IV (severe) ([place]) 12/02/2014  . Diabetes mellitus type 2, controlled ([place]) 12/01/2014  . Acute respiratory failure with hypoxia ([place]) 12/01/2014  . THYROID NODULE 08/06/2009  . CATARACTS, BILATERAL 11/20/2006  . CAROTID STENOSIS 11/20/2006    Earlie Counts, PT 11/07/17 9:47 AM   [facility] [address], [place], [place], [address] Phone: [phone]   Fax:  [phone]  Name: Rachel Copeland MRN: [id number] Date of Birth: 1929-02-03

## 2017-11-19 ENCOUNTER — Ambulatory Visit: Payer: Medicare Other | Admitting: Physical Therapy

## 2017-11-19 DIAGNOSIS — R278 Other lack of coordination: Secondary | ICD-10-CM | POA: Diagnosis not present

## 2017-11-19 DIAGNOSIS — M6281 Muscle weakness (generalized): Secondary | ICD-10-CM | POA: Diagnosis not present

## 2017-11-19 NOTE — Therapy (Signed)
[facility] [address], [place], [place], [address] Phone: [phone]   Fax:  [phone]  Physical Therapy Treatment  Patient Details  Name: ABBIEGAIL Copeland MRN: [id number] Date of Birth: 03/11/1929 Referring Provider: Dr. Harl Bowie   Encounter Date: 11/19/2017  PT End of Session - 11/19/17 0851    Visit Number  2    Number of Visits  10    Date for PT Re-Evaluation  01/02/18    Authorization Type  medicare/medicaid; 10th visit need g-code    PT Start Time  0851    PT Stop Time  0930    PT Time Calculation (min)  39 min    Activity Tolerance  Patient tolerated treatment well    Behavior During Therapy  [facility] for tasks assessed/performed       Past Medical History:  Diagnosis Date  . Diabetes mellitus    no medications  . Hyperlipidemia   . Hypertension     Past Surgical History:  Procedure Laterality Date  . NO PAST SURGERIES      There were no vitals filed for this visit.  Subjective Assessment - 11/19/17 0853    Subjective  I have had leakage, sometimes 3-4x / day.      Patient Stated Goals  reduce fecal leakage    Currently in Pain?  No/denies                      [facility] Adult PT Treatment/Exercise - 11/19/17 0001      Neuro Re-ed    Neuro Re-ed Details   pelvic floor EMG for cues to contract and relax anal sphincter muscles, unable to hold more than 1-2 sec, cues to contract at 50% strength and hold 2 sec, rest as needed due to muscle fatigue: variety of positions sidelying supine with and without ball squeeze; quick contractions             PT Education - 11/19/17 0932    Education provided  Yes    Education Details  ball squeeze with breathing    Person(s) Educated  Patient    Methods  Explanation;Demonstration;Verbal cues;Handout    Comprehension  Verbalized understanding;Returned demonstration       PT Short Term Goals - 11/07/17 0936      PT SHORT  TERM GOAL #1   Title  independent with initial HEP    Time  4    Period  Weeks    Status  New    Target Date  12/05/17      PT SHORT TERM GOAL #2   Title  pelvic floor strength >/= 3/5 to reduce fecal leakage    Time  4    Period  Weeks    Status  New    Target Date  12/05/17      PT SHORT TERM GOAL #3   Title  sit to stand </= 16 sec with no hands    Time  4    Period  Weeks    Status  New    Target Date  12/05/17        PT Long Term Goals - 11/07/17 0938      PT LONG TERM GOAL #1   Title  independent with HEP and how to progress herself    Time  8    Period  Weeks    Status  New    Target Date  01/02/18  PT LONG TERM GOAL #2   Title  use </=1 pad per day due to decreased in fecal leakage    Time  8    Period  Weeks    Status  New    Target Date  01/02/18      PT LONG TERM GOAL #3   Title  aware of fecal leakage due to increased sensation by 50%    Time  8    Period  Weeks    Status  New    Target Date  01/02/18      PT LONG TERM GOAL #4   Title  sit to stand </= 15 seconds so she is able to get up and down from the commode with greater ease    Time  8    Period  Weeks    Status  New    Target Date  01/02/18            Plan - 11/19/17 0929    Clinical Impression Statement  Patient did well with EMG for feedback to get muscle contraction.  She was able to coordinate breathing with contraction using the visual feedback.  Pt will benefit from skilled PT to increase endurance of pelvic floor contraction as she was unable to hold contraction more than 2 sec.    PT Treatment/Interventions  Biofeedback;Therapeutic activities;Therapeutic exercise;Patient/family education;Neuromuscular re-education;Manual techniques    PT Next Visit Plan  pelvic contraction, sit to stand, glute strength, EMG    PT Home Exercise Plan  progress as needed    Consulted and Agree with Plan of Care  Patient       Patient will benefit from skilled therapeutic intervention in  order to improve the following deficits and impairments:  Decreased strength, Decreased coordination  Visit Diagnosis: Muscle weakness (generalized)  Other lack of coordination     Problem List Patient Active Problem List   Diagnosis Date Noted  . Bilateral pneumonia   . CAP (community acquired pneumonia) 12/02/2014  . HTN (hypertension) 12/02/2014  . Dyslipidemia 12/02/2014  . Chronic diastolic CHF (congestive heart failure) ([place]) 12/02/2014  . Anemia of chronic disease 12/02/2014  . Thrombocytopenia ([place]) 12/02/2014  . Chronic kidney disease (CKD), stage IV (severe) ([place]) 12/02/2014  . Diabetes mellitus type 2, controlled ([place]) 12/01/2014  . Acute respiratory failure with hypoxia ([place]) 12/01/2014  . THYROID NODULE 08/06/2009  . CATARACTS, BILATERAL 11/20/2006  . CAROTID STENOSIS 11/20/2006    Rachel Copeland, PT 11/19/2017, 9:33 AM  [facility] [address], [place], [place], [address] Phone: [phone]   Fax:  [phone]  Name: Rachel Copeland MRN: [id number] Date of Birth: 1929-08-21

## 2017-11-19 NOTE — Patient Instructions (Signed)
   Squeeze anal sphincter muscles with ball or pillow between the knees.  Squeeze at 50% of your maximum contraction and hold 2 sec.  EXHALE while your squeeze Do 2 sets of 10 reps daily  [facility] [address] [place], [place] [address] Phone # [phone] Fax [phone]

## 2017-11-26 ENCOUNTER — Ambulatory Visit: Payer: Medicare Other | Attending: Gastroenterology | Admitting: Physical Therapy

## 2017-11-26 DIAGNOSIS — M6281 Muscle weakness (generalized): Secondary | ICD-10-CM | POA: Insufficient documentation

## 2017-11-26 DIAGNOSIS — R278 Other lack of coordination: Secondary | ICD-10-CM

## 2017-11-26 NOTE — Patient Instructions (Signed)
Functional Quadriceps: Sit to Stand    Sit on edge of chair, feet flat on floor. Tighten pelvic floor like trying not to pass gas.  Stand upright, extending knees fully. Repeat __10__ times per set. Do _1___ sets per session. Do _3___ sessions per day.  http://orth.exer.us/735   Copyright  VHI. All rights reserved.   [place] [address] [place], [place] [address] Phone # [phone] Fax [phone]

## 2017-11-26 NOTE — Therapy (Signed)
[facility] [address] [place], [place], [address] Phone: [phone]   Fax:  [phone]  Physical Therapy Treatment  Patient Details  Name: Rachel Copeland MRN: [id number] Date of Birth: 1929/12/03 Referring Provider: Dr. Harl Bowie   Encounter Date: 11/26/2017  PT End of Session - 11/26/17 0849    Visit Number  3    Number of Visits  10    Date for PT Re-Evaluation  01/02/18    Authorization Type  medicare/medicaid; 10th visit need g-code    PT Start Time  0846    PT Stop Time  0929    PT Time Calculation (min)  43 min    Activity Tolerance  Patient tolerated treatment well    Behavior During Therapy  [facility] for tasks assessed/performed       Past Medical History:  Diagnosis Date  . Diabetes mellitus    no medications  . Hyperlipidemia   . Hypertension     Past Surgical History:  Procedure Laterality Date  . NO PAST SURGERIES      There were no vitals filed for this visit.  Subjective Assessment - 11/26/17 0847    Subjective  I have been having leakage 3-4x / day.  I notice some leakage usually when I go to the bathroom    Patient Stated Goals  reduce fecal leakage    Currently in Pain?  No/denies                      [facility] Adult PT Treatment/Exercise - 11/26/17 0001      Therapeutic Activites    Therapeutic Activities  ADL's    ADL's  sit to stand with contracting pelvic floor pior to stand      Neuro Re-ed    Neuro Re-ed Details   tactile cues to pelvic floor internal and external with cues to contract levator ani and anal sphincter muscles      Knee/Hip Exercises: Seated   Sit to Sand  10 reps;without UE support;5 reps 10 reps elevated surface; 5 less elevated      Manual Therapy   Manual Therapy  Internal Pelvic Floor    Manual therapy comments  pt informed and consent given to perform internal soft tissue             PT Education - 11/26/17 0925    Education provided  Yes    Education Details  sit to stand with contract    Person(s) Educated  Patient    Methods  Explanation;Demonstration;Handout    Comprehension  Verbalized understanding;Returned demonstration       PT Short Term Goals - 11/07/17 0936      PT SHORT TERM GOAL #1   Title  independent with initial HEP    Time  4    Period  Weeks    Status  New    Target Date  12/05/17      PT SHORT TERM GOAL #2   Title  pelvic floor strength >/= 3/5 to reduce fecal leakage    Time  4    Period  Weeks    Status  New    Target Date  12/05/17      PT SHORT TERM GOAL #3   Title  sit to stand </= 16 sec with no hands    Time  4    Period  Weeks    Status  New    Target Date  12/05/17        PT Long Term Goals - 11/26/17 0849      PT LONG TERM GOAL #1   Title  independent with HEP and how to progress herself      PT LONG TERM GOAL #2   Title  use </=1 pad per day due to decreased in fecal leakage    Baseline  6 or more, about every time I use the bathroom    Time  8    Period  Weeks    Status  On-going      PT LONG TERM GOAL #3   Title  aware of fecal leakage due to increased sensation by 50%    Time  8    Period  Weeks    Status  On-going      PT LONG TERM GOAL #4   Title  sit to stand </= 15 seconds so she is able to get up and down from the commode with greater ease    Period  Weeks    Status  On-going            Plan - 11/26/17 0926    Clinical Impression Statement  Patient demonstrates contraction with tactile cues and some resistence in sidelying.  Pt needs cues to relax pelvic floor fully after contracting.  Pt continues to have difficulty coordinating with breathing and tends to hold breath and inhale when contracting.  Weak contraction 1-2/5MMT    Clinical Impairments Affecting Rehab Potential  Diabets; Hypertension; CHF    PT Treatment/Interventions  Biofeedback;Therapeutic activities;Therapeutic exercise;Patient/family education;Neuromuscular  re-education;Manual techniques    PT Next Visit Plan  pelvic contraction, sit to stand, glute strength, EMG    PT Home Exercise Plan  progress as needed    Consulted and Agree with Plan of Care  Patient       Patient will benefit from skilled therapeutic intervention in order to improve the following deficits and impairments:  Decreased strength, Decreased coordination  Visit Diagnosis: Muscle weakness (generalized)  Other lack of coordination     Problem List Patient Active Problem List   Diagnosis Date Noted  . Bilateral pneumonia   . CAP (community acquired pneumonia) 12/02/2014  . HTN (hypertension) 12/02/2014  . Dyslipidemia 12/02/2014  . Chronic diastolic CHF (congestive heart failure) ([place]) 12/02/2014  . Anemia of chronic disease 12/02/2014  . Thrombocytopenia ([place]) 12/02/2014  . Chronic kidney disease (CKD), stage IV (severe) ([place]) 12/02/2014  . Diabetes mellitus type 2, controlled ([place]) 12/01/2014  . Acute respiratory failure with hypoxia ([place]) 12/01/2014  . THYROID NODULE 08/06/2009  . CATARACTS, BILATERAL 11/20/2006  . CAROTID STENOSIS 11/20/2006    Zannie Cove, PT 11/26/2017, 9:28 AM  [facility] [address], [place], [place], [address] Phone: [phone]   Fax:  [phone]  Name: GINGER LEETH MRN: [id number] Date of Birth: 1929/11/14

## 2017-12-03 ENCOUNTER — Encounter: Payer: Medicare Other | Admitting: Physical Therapy

## 2017-12-10 ENCOUNTER — Ambulatory Visit: Payer: Medicare Other | Admitting: Physical Therapy

## 2017-12-10 DIAGNOSIS — R278 Other lack of coordination: Secondary | ICD-10-CM

## 2017-12-10 DIAGNOSIS — M6281 Muscle weakness (generalized): Secondary | ICD-10-CM

## 2017-12-10 NOTE — Patient Instructions (Addendum)
Bridge    Lie back, legs bent. Inhale.  Exhale, then lift pelvis while you exhale.  Squeezing your anus as you lift your pelvis Repeat __10__ times. Do __2__ sessions per day.  http://pm.exer.us/55   Copyright  VHI. All rights reserved.    Abduction: Clam (Eccentric) - Side-Lying    Lie on side with knees bent. Lift top knee, keeping feet together. Keep trunk steady, squeeze anus as if trying not to pass gas. Slowly lower for 3-5 seconds. _20__ reps per set, __1_ sets per day, _5__ days per week.  http://ecce.exer.us/65   Copyright  VHI. All rights reserved.    Hip Flexion / Knee Extension: Straight-Leg Raise (Eccentric)    Lie on back. Lift leg with knee straight. Squeeze anus and pelvic floor, then left.  Slowly lower leg for 3-5 seconds. _10__ reps per set, _2__ sets per day, __5_ days per week. Lower like elevator, stopping at each floor.   Copyright  VHI. All rights reserved.    [place] [address] [place], [place] [address] Phone # [phone] Fax [phone]

## 2017-12-10 NOTE — Therapy (Addendum)
[facility] [address] [place], [place], [address] Phone: [phone]   Fax:  [phone]  Physical Therapy Treatment  Patient Details  Name: Rachel Copeland MRN: [id number] Date of Birth: Jun 03, 1929 Referring Provider: Dr. Harl Bowie   Encounter Date: 12/10/2017  PT End of Session - 12/10/17 0855    Visit Number  4    Number of Visits  10    Date for PT Re-Evaluation  01/02/18    Authorization Type  medicare/medicaid; 10th visit need g-code    PT Start Time  0846    PT Stop Time  0928    PT Time Calculation (min)  42 min    Activity Tolerance  Patient tolerated treatment well    Behavior During Therapy  [facility] for tasks assessed/performed       Past Medical History:  Diagnosis Date  . Diabetes mellitus    no medications  . Hyperlipidemia   . Hypertension     Past Surgical History:  Procedure Laterality Date  . NO PAST SURGERIES      There were no vitals filed for this visit.  Subjective Assessment - 12/10/17 0850    Subjective  I have had leakage still every time I use the bathroom I notice leakage and it is very soft stool, not liquid.      Patient Stated Goals  reduce fecal leakage    Currently in Pain?  No/denies                      [facility] Adult PT Treatment/Exercise - 12/10/17 0001      Neuro Re-ed    Neuro Re-ed Details   tactile cues (externally) for lifting pelvic floor and squeezing anus; breath coodination with exercises and squeeze      Knee/Hip Exercises: Supine   Hip Adduction Isometric  Strengthening;Both;2 sets;10 reps with squeezing anus; 2 sec x 10; 5 sec x 10    Bridges  Strengthening;Both;10 reps mini with squeezing anus    Straight Leg Raises  Strengthening;Both;10 reps 10 reps each side with brace then lift    Other Supine Knee/Hip Exercises  clam red band - 2 x 10             PT Education - 12/10/17 0914    Education provided  Yes     Education Details  bridge, SLR, and clam with pelvic contraction    Person(s) Educated  Patient    Methods  Explanation;Demonstration;Verbal cues;Handout    Comprehension  Verbalized understanding;Returned demonstration       PT Short Term Goals - 12/10/17 0854      PT SHORT TERM GOAL #1   Title  independent with initial HEP    Time  4    Period  Weeks    Status  Achieved      PT SHORT TERM GOAL #2   Title  pelvic floor strength >/= 3/5 to reduce fecal leakage    Time  4    Period  Weeks    Status  On-going      PT SHORT TERM GOAL #3   Title  sit to stand </= 16 sec with no hands    Time  4    Period  Weeks    Status  On-going        PT Long Term Goals - 11/26/17 0849      PT LONG TERM GOAL #1   Title  independent  with HEP and how to progress herself      PT LONG TERM GOAL #2   Title  use </=1 pad per day due to decreased in fecal leakage    Baseline  6 or more, about every time I use the bathroom    Time  8    Period  Weeks    Status  On-going      PT LONG TERM GOAL #3   Title  aware of fecal leakage due to increased sensation by 50%    Time  8    Period  Weeks    Status  On-going      PT LONG TERM GOAL #4   Title  sit to stand </= 15 seconds so she is able to get up and down from the commode with greater ease    Period  Weeks    Status  On-going            Plan - 12/10/17 0936    Clinical Impression Statement  Patient continues to progress strength.  She needs cues for coordination of breathing and for contracting anal sphincter and pelvic floor prior to doing exercises.  Pt was able to contract and hold for 5 seconds with ball squeezes and added to HEP.  Pt will continue to benefit from skilled PT to work on increased strength and coordination for greater control of bowel and bladder function and improv3ed QOL.    PT Treatment/Interventions  Biofeedback;Therapeutic activities;Therapeutic exercise;Patient/family education;Neuromuscular  re-education;Manual techniques    PT Next Visit Plan  STM and tactile cues to assess puborectalis, anal sphincters, breathing coordination, review and progress HEP    PT Home Exercise Plan  progress as needed    Consulted and Agree with Plan of Care  Patient       Patient will benefit from skilled therapeutic intervention in order to improve the following deficits and impairments:  Decreased strength, Decreased coordination  Visit Diagnosis: Muscle weakness (generalized)  Other lack of coordination     Problem List Patient Active Problem List   Diagnosis Date Noted  . Bilateral pneumonia   . CAP (community acquired pneumonia) 12/02/2014  . HTN (hypertension) 12/02/2014  . Dyslipidemia 12/02/2014  . Chronic diastolic CHF (congestive heart failure) ([place]) 12/02/2014  . Anemia of chronic disease 12/02/2014  . Thrombocytopenia ([place]) 12/02/2014  . Chronic kidney disease (CKD), stage IV (severe) ([place]) 12/02/2014  . Diabetes mellitus type 2, controlled ([place]) 12/01/2014  . Acute respiratory failure with hypoxia ([place]) 12/01/2014  . THYROID NODULE 08/06/2009  . CATARACTS, BILATERAL 11/20/2006  . CAROTID STENOSIS 11/20/2006    Zannie Cove, PT 12/10/2017, 9:42 AM   [facility] [address] [place], [place], [address] Phone: [phone]   Fax:  [phone]  Name: Rachel Copeland MRN: [id number] Date of Birth: [birth year]  PHYSICAL THERAPY DISCHARGE SUMMARY  Visits from Start of Care: 4  Current functional level related to goals / functional outcomes: See above goals   Remaining deficits: See above    Education / Equipment: HEP Plan: Patient agrees to discharge.  Patient goals were not met. Patient is being discharged due to not returning since the last visit.  ?????     Google, PT 01/15/18 8:31 AM

## 2017-12-11 DIAGNOSIS — E119 Type 2 diabetes mellitus without complications: Secondary | ICD-10-CM | POA: Diagnosis not present

## 2017-12-11 DIAGNOSIS — I10 Essential (primary) hypertension: Secondary | ICD-10-CM | POA: Diagnosis not present

## 2017-12-11 DIAGNOSIS — N189 Chronic kidney disease, unspecified: Secondary | ICD-10-CM | POA: Diagnosis not present

## 2017-12-11 DIAGNOSIS — M1990 Unspecified osteoarthritis, unspecified site: Secondary | ICD-10-CM | POA: Diagnosis not present

## 2017-12-11 DIAGNOSIS — E782 Mixed hyperlipidemia: Secondary | ICD-10-CM | POA: Diagnosis not present

## 2018-02-21 DIAGNOSIS — Z0000 Encounter for general adult medical examination without abnormal findings: Secondary | ICD-10-CM | POA: Diagnosis not present

## 2018-04-10 DIAGNOSIS — E119 Type 2 diabetes mellitus without complications: Secondary | ICD-10-CM | POA: Diagnosis not present

## 2018-04-10 DIAGNOSIS — E782 Mixed hyperlipidemia: Secondary | ICD-10-CM | POA: Diagnosis not present

## 2018-04-10 DIAGNOSIS — I10 Essential (primary) hypertension: Secondary | ICD-10-CM | POA: Diagnosis not present

## 2018-07-09 DIAGNOSIS — N189 Chronic kidney disease, unspecified: Secondary | ICD-10-CM | POA: Diagnosis not present

## 2018-07-09 DIAGNOSIS — R2242 Localized swelling, mass and lump, left lower limb: Secondary | ICD-10-CM | POA: Diagnosis not present

## 2018-07-09 DIAGNOSIS — L0390 Cellulitis, unspecified: Secondary | ICD-10-CM | POA: Diagnosis not present

## 2018-07-09 DIAGNOSIS — E782 Mixed hyperlipidemia: Secondary | ICD-10-CM | POA: Diagnosis not present

## 2018-07-09 DIAGNOSIS — E119 Type 2 diabetes mellitus without complications: Secondary | ICD-10-CM | POA: Diagnosis not present

## 2018-07-23 DIAGNOSIS — E782 Mixed hyperlipidemia: Secondary | ICD-10-CM | POA: Diagnosis not present

## 2018-07-23 DIAGNOSIS — I10 Essential (primary) hypertension: Secondary | ICD-10-CM | POA: Diagnosis not present

## 2018-08-07 DIAGNOSIS — I10 Essential (primary) hypertension: Secondary | ICD-10-CM | POA: Diagnosis not present

## 2018-08-07 DIAGNOSIS — I739 Peripheral vascular disease, unspecified: Secondary | ICD-10-CM | POA: Diagnosis not present

## 2018-08-07 DIAGNOSIS — E782 Mixed hyperlipidemia: Secondary | ICD-10-CM | POA: Diagnosis not present

## 2018-08-07 DIAGNOSIS — E119 Type 2 diabetes mellitus without complications: Secondary | ICD-10-CM | POA: Diagnosis not present

## 2018-09-24 DIAGNOSIS — I739 Peripheral vascular disease, unspecified: Secondary | ICD-10-CM | POA: Diagnosis not present

## 2018-09-24 DIAGNOSIS — E782 Mixed hyperlipidemia: Secondary | ICD-10-CM | POA: Diagnosis not present

## 2018-09-24 DIAGNOSIS — E089 Diabetes mellitus due to underlying condition without complications: Secondary | ICD-10-CM | POA: Diagnosis not present

## 2018-09-24 DIAGNOSIS — L0390 Cellulitis, unspecified: Secondary | ICD-10-CM | POA: Diagnosis not present

## 2018-09-24 DIAGNOSIS — I10 Essential (primary) hypertension: Secondary | ICD-10-CM | POA: Diagnosis not present

## 2018-09-24 DIAGNOSIS — E119 Type 2 diabetes mellitus without complications: Secondary | ICD-10-CM | POA: Diagnosis not present

## 2018-10-03 ENCOUNTER — Other Ambulatory Visit: Payer: Self-pay | Admitting: Family Medicine

## 2018-10-03 DIAGNOSIS — Z1231 Encounter for screening mammogram for malignant neoplasm of breast: Secondary | ICD-10-CM

## 2018-10-15 DIAGNOSIS — I10 Essential (primary) hypertension: Secondary | ICD-10-CM | POA: Diagnosis not present

## 2018-10-15 DIAGNOSIS — L0390 Cellulitis, unspecified: Secondary | ICD-10-CM | POA: Diagnosis not present

## 2018-10-15 DIAGNOSIS — I739 Peripheral vascular disease, unspecified: Secondary | ICD-10-CM | POA: Diagnosis not present

## 2018-10-15 DIAGNOSIS — Z23 Encounter for immunization: Secondary | ICD-10-CM | POA: Diagnosis not present

## 2018-10-15 DIAGNOSIS — E119 Type 2 diabetes mellitus without complications: Secondary | ICD-10-CM | POA: Diagnosis not present

## 2018-11-08 ENCOUNTER — Ambulatory Visit
Admission: RE | Admit: 2018-11-08 | Discharge: 2018-11-08 | Disposition: A | Discharge status: Home / Self Care | Payer: Medicare Other | Source: Ambulatory Visit | Attending: Family Medicine | Admitting: Family Medicine

## 2018-11-08 DIAGNOSIS — Z1231 Encounter for screening mammogram for malignant neoplasm of breast: Secondary | ICD-10-CM | POA: Diagnosis not present

## 2018-11-28 ENCOUNTER — Observation Stay (HOSPITAL_COMMUNITY)
Admission: EM | Admit: 2018-11-28 | Discharge: 2018-11-30 | Disposition: A | Discharge status: Home / Self Care | Payer: Medicare Other | Attending: Family Medicine | Admitting: Family Medicine

## 2018-11-28 ENCOUNTER — Emergency Department (HOSPITAL_COMMUNITY): Payer: Medicare Other

## 2018-11-28 ENCOUNTER — Encounter (HOSPITAL_COMMUNITY): Payer: Self-pay | Admitting: Emergency Medicine

## 2018-11-28 DIAGNOSIS — R509 Fever, unspecified: Secondary | ICD-10-CM | POA: Diagnosis not present

## 2018-11-28 DIAGNOSIS — R630 Anorexia: Secondary | ICD-10-CM

## 2018-11-28 DIAGNOSIS — Z79899 Other long term (current) drug therapy: Secondary | ICD-10-CM | POA: Insufficient documentation

## 2018-11-28 DIAGNOSIS — Z833 Family history of diabetes mellitus: Secondary | ICD-10-CM | POA: Diagnosis not present

## 2018-11-28 DIAGNOSIS — E1165 Type 2 diabetes mellitus with hyperglycemia: Secondary | ICD-10-CM | POA: Diagnosis not present

## 2018-11-28 DIAGNOSIS — E119 Type 2 diabetes mellitus without complications: Secondary | ICD-10-CM

## 2018-11-28 DIAGNOSIS — N184 Chronic kidney disease, stage 4 (severe): Secondary | ICD-10-CM | POA: Insufficient documentation

## 2018-11-28 DIAGNOSIS — I10 Essential (primary) hypertension: Secondary | ICD-10-CM | POA: Diagnosis present

## 2018-11-28 DIAGNOSIS — R5381 Other malaise: Secondary | ICD-10-CM | POA: Diagnosis not present

## 2018-11-28 DIAGNOSIS — R531 Weakness: Secondary | ICD-10-CM | POA: Diagnosis not present

## 2018-11-28 DIAGNOSIS — Z7982 Long term (current) use of aspirin: Secondary | ICD-10-CM | POA: Insufficient documentation

## 2018-11-28 DIAGNOSIS — R262 Difficulty in walking, not elsewhere classified: Secondary | ICD-10-CM | POA: Diagnosis not present

## 2018-11-28 DIAGNOSIS — M79605 Pain in left leg: Secondary | ICD-10-CM | POA: Diagnosis not present

## 2018-11-28 DIAGNOSIS — Z6831 Body mass index (BMI) 31.0-31.9, adult: Secondary | ICD-10-CM | POA: Diagnosis not present

## 2018-11-28 DIAGNOSIS — E876 Hypokalemia: Secondary | ICD-10-CM | POA: Diagnosis not present

## 2018-11-28 DIAGNOSIS — I130 Hypertensive heart and chronic kidney disease with heart failure and stage 1 through stage 4 chronic kidney disease, or unspecified chronic kidney disease: Secondary | ICD-10-CM | POA: Diagnosis not present

## 2018-11-28 DIAGNOSIS — R52 Pain, unspecified: Secondary | ICD-10-CM | POA: Diagnosis not present

## 2018-11-28 DIAGNOSIS — E1122 Type 2 diabetes mellitus with diabetic chronic kidney disease: Secondary | ICD-10-CM | POA: Insufficient documentation

## 2018-11-28 DIAGNOSIS — E669 Obesity, unspecified: Secondary | ICD-10-CM | POA: Insufficient documentation

## 2018-11-28 DIAGNOSIS — R2242 Localized swelling, mass and lump, left lower limb: Secondary | ICD-10-CM | POA: Diagnosis not present

## 2018-11-28 DIAGNOSIS — D6959 Other secondary thrombocytopenia: Secondary | ICD-10-CM | POA: Diagnosis not present

## 2018-11-28 DIAGNOSIS — E785 Hyperlipidemia, unspecified: Secondary | ICD-10-CM | POA: Diagnosis not present

## 2018-11-28 DIAGNOSIS — R112 Nausea with vomiting, unspecified: Secondary | ICD-10-CM | POA: Diagnosis not present

## 2018-11-28 DIAGNOSIS — B9689 Other specified bacterial agents as the cause of diseases classified elsewhere: Secondary | ICD-10-CM | POA: Diagnosis not present

## 2018-11-28 DIAGNOSIS — I5032 Chronic diastolic (congestive) heart failure: Secondary | ICD-10-CM | POA: Insufficient documentation

## 2018-11-28 DIAGNOSIS — R079 Chest pain, unspecified: Secondary | ICD-10-CM | POA: Diagnosis not present

## 2018-11-28 DIAGNOSIS — I878 Other specified disorders of veins: Secondary | ICD-10-CM | POA: Diagnosis not present

## 2018-11-28 DIAGNOSIS — D696 Thrombocytopenia, unspecified: Secondary | ICD-10-CM | POA: Diagnosis present

## 2018-11-28 DIAGNOSIS — L03116 Cellulitis of left lower limb: Secondary | ICD-10-CM | POA: Diagnosis not present

## 2018-11-28 HISTORY — DX: Heart failure, unspecified: I50.9

## 2018-11-28 LAB — PROTIME-INR
INR: 1.02
Prothrombin Time: 13.3 seconds (ref 11.4–15.2)

## 2018-11-28 LAB — CBC WITH DIFFERENTIAL/PLATELET
ABS IMMATURE GRANULOCYTES: 0.03 10*3/uL (ref 0.00–0.07)
Basophils Absolute: 0 10*3/uL (ref 0.0–0.1)
Basophils Relative: 0 %
Eosinophils Absolute: 0.1 10*3/uL (ref 0.0–0.5)
Eosinophils Relative: 1 %
HCT: 36.6 % (ref 36.0–46.0)
HEMOGLOBIN: 11.5 g/dL — AB (ref 12.0–15.0)
IMMATURE GRANULOCYTES: 0 %
LYMPHS PCT: 15 %
Lymphs Abs: 1.2 10*3/uL (ref 0.7–4.0)
MCH: 25.4 pg — AB (ref 26.0–34.0)
MCHC: 31.4 g/dL (ref 30.0–36.0)
MCV: 80.8 fL (ref 80.0–100.0)
Monocytes Absolute: 1 10*3/uL (ref 0.1–1.0)
Monocytes Relative: 12 %
NEUTROS ABS: 5.6 10*3/uL (ref 1.7–7.7)
NEUTROS PCT: 72 %
PLATELETS: 109 10*3/uL — AB (ref 150–400)
RBC: 4.53 MIL/uL (ref 3.87–5.11)
RDW: 17 % — ABNORMAL HIGH (ref 11.5–15.5)
WBC: 7.9 10*3/uL (ref 4.0–10.5)
nRBC: 0 % (ref 0.0–0.2)

## 2018-11-28 LAB — COMPREHENSIVE METABOLIC PANEL
ALT: 18 U/L (ref 0–44)
AST: 21 U/L (ref 15–41)
Albumin: 3.6 g/dL (ref 3.5–5.0)
Alkaline Phosphatase: 52 U/L (ref 38–126)
Anion gap: 8 (ref 5–15)
BUN: 21 mg/dL (ref 8–23)
CHLORIDE: 101 mmol/L (ref 98–111)
CO2: 27 mmol/L (ref 22–32)
Calcium: 9.4 mg/dL (ref 8.9–10.3)
Creatinine, Ser: 1.26 mg/dL — ABNORMAL HIGH (ref 0.44–1.00)
GFR calc Af Amer: 44 mL/min — ABNORMAL LOW (ref >60)
GFR calc non Af Amer: 38 mL/min — ABNORMAL LOW (ref >60)
Glucose, Bld: 155 mg/dL — ABNORMAL HIGH (ref 70–99)
POTASSIUM: 2.8 mmol/L — AB (ref 3.5–5.1)
SODIUM: 136 mmol/L (ref 135–145)
Total Bilirubin: 0.8 mg/dL (ref 0.3–1.2)
Total Protein: 7.3 g/dL (ref 6.5–8.1)

## 2018-11-28 LAB — I-STAT TROPONIN, ED: TROPONIN I, POC: 0.02 ng/mL (ref 0.00–0.08)

## 2018-11-28 LAB — BRAIN NATRIURETIC PEPTIDE: B Natriuretic Peptide: 148.4 pg/mL — ABNORMAL HIGH (ref 0.0–100.0)

## 2018-11-28 MED ORDER — POTASSIUM CHLORIDE 10 MEQ/100ML IV SOLN
10.0000 meq | Freq: Once | INTRAVENOUS | Status: AC
  Administered 2018-11-29: 10 meq via INTRAVENOUS
  Filled 2018-11-28: qty 100

## 2018-11-28 MED ORDER — POTASSIUM CHLORIDE CRYS ER 20 MEQ PO TBCR
40.0000 meq | EXTENDED_RELEASE_TABLET | Freq: Once | ORAL | Status: AC
  Administered 2018-11-29: 40 meq via ORAL
  Filled 2018-11-28: qty 2

## 2018-11-28 NOTE — ED Provider Notes (Signed)
[facility] DEPT Provider Note   CSN: [id number] Arrival date & time: 11/28/18  2120     History   Chief Complaint Chief Complaint  Patient presents with  . Weakness  . Leg Pain    bilateral leg pain and right knee pain     HPI Rachel Copeland is a 84 y.o. female.  HPI Patient reports that she is mostly here for pain in her legs.  She reports that the left leg has been the most painful.  Is in the lower leg and somewhat to the inside.  She reports it is worse when she walks.  She thought it is painful because of her edema.  She has not had any falls or injuries.  She reports that her right leg hurts a little bit as well but not as much.  She reports she is also had a loss of appetite.  She reports she has not had much to eat for about 4 days.  She denies she is having any vomiting or diarrhea.  She denies chest pain shortness of breath or abdominal pain.  No fever that she is aware of. Past Medical History:  Diagnosis Date  . Diabetes mellitus    no medications  . Hyperlipidemia   . Hypertension     Patient Active Problem List   Diagnosis Date Noted  . Bilateral pneumonia   . CAP (community acquired pneumonia) 12/02/2014  . HTN (hypertension) 12/02/2014  . Dyslipidemia 12/02/2014  . Chronic diastolic CHF (congestive heart failure) ([place]) 12/02/2014  . Anemia of chronic disease 12/02/2014  . Thrombocytopenia ([place]) 12/02/2014  . Chronic kidney disease (CKD), stage IV (severe) ([place]) 12/02/2014  . Diabetes mellitus type 2, controlled ([place]) 12/01/2014  . Acute respiratory failure with hypoxia ([place]) 12/01/2014  . THYROID NODULE 08/06/2009  . CATARACTS, BILATERAL 11/20/2006  . CAROTID STENOSIS 11/20/2006    Past Surgical History:  Procedure Laterality Date  . NO PAST SURGERIES       OB History   None      Home Medications    Prior to Admission medications   Medication Sig Start Date End Date Taking? Authorizing Provider    amLODipine (NORVASC) 10 MG tablet Take 10 mg by mouth daily.    [provider]  Ascorbic Acid (VITAMIN C) 1000 MG tablet Take 1,000 mg by mouth every morning.    [provider]  aspirin 81 MG tablet Take 81 mg by mouth daily.    [provider]  calcium-vitamin D (OSCAL WITH D) 500-200 MG-UNIT per tablet Take 1 tablet by mouth every morning.    [provider]  carvedilol (COREG) 25 MG tablet Take 25 mg by mouth 2 (two) times daily with a meal.    [provider]  fish oil-omega-3 fatty acids 1000 MG capsule Take 1 g by mouth every morning.    [provider]  furosemide (LASIX) 40 MG tablet Take 1 tablet (40 mg total) by mouth every morning. 12/06/14   Robbie Lis, MD  Multiple Vitamins-Minerals (MULTIVITAMIN ADULT PO) Take by mouth.    [provider]  potassium chloride (K-DUR) 10 MEQ tablet Take 1 tablet (10 mEq total) by mouth daily. 12/06/14   Robbie Lis, MD  valsartan-hydrochlorothiazide (DIOVAN-HCT) 320-25 MG per tablet Take 1 tablet by mouth daily.    [provider]    Family History Family History  Problem Relation Age of Onset  . Colon cancer Mother   . Colon  polyps Mother   . Diabetes Mellitus II Brother   . Diabetes Mellitus II Daughter   . Breast cancer Neg Hx   . Stomach cancer Neg Hx     Social History Social History   Tobacco Use  . Smoking status: Never Smoker  . Smokeless tobacco: Never Used  Substance Use Topics  . Alcohol use: No  . Drug use: No     Allergies   Sitagliptin phosphate   Review of Systems Review of Systems 10 Systems reviewed and are negative for acute change except as noted in the HPI.  Physical Exam Updated Vital Signs BP (!) 163/53 (BP Location: Right Arm) Comment: Jon, RN aware  Pulse 80   Temp 98.4 F (36.9 C) (Oral)   Resp 18   Ht 5\' 5"  (1.651 m)   Wt 86.2 kg   SpO2 95%   BMI 31.62 kg/m   Physical Exam  Constitutional: She is oriented to  person, place, and time.  Patient is alert and nontoxic.  No respiratory distress.  She has some central obesity.  HENT:  Head: Normocephalic and atraumatic.  Mouth/Throat: Oropharynx is clear and moist.  Eyes: EOM are normal.  Cardiovascular: Normal rate, regular rhythm, normal heart sounds and intact distal pulses.  Pulmonary/Chest: Effort normal and breath sounds normal.  Abdominal: Soft. She exhibits no distension. There is no tenderness. There is no guarding.  Musculoskeletal:  Patient has 1+ to 2+ edema of the left lower leg.  There is warmth and tenderness to the medial and central lower leg.  Foot is without swelling.  Right leg has 1+ edema.  Calf is nontender on the right.  Dorsalis pedis pulses are 2+ and symmetric.  Patient does have onychomycosis of the toenails.  She has general thinning of the skin of the lower legs.  She does not have any active appearing wounds.  Neurological: She is alert and oriented to person, place, and time. She exhibits normal muscle tone. Coordination normal.  Skin: Skin is warm and dry.  Psychiatric: She has a normal mood and affect.     ED Treatments / Results  Labs (all labs ordered are listed, but only abnormal results are displayed) Labs Reviewed  COMPREHENSIVE METABOLIC PANEL - Abnormal; Notable for the following components:      Result Value   Potassium 2.8 (*)    Glucose, Bld 155 (*)    Creatinine, Ser 1.26 (*)    GFR calc non Af Amer 38 (*)    GFR calc Af Amer 44 (*)    All other components within normal limits  BRAIN NATRIURETIC PEPTIDE - Abnormal; Notable for the following components:   B Natriuretic Peptide 148.4 (*)    All other components within normal limits  CBC WITH DIFFERENTIAL/PLATELET - Abnormal; Notable for the following components:   Hemoglobin 11.5 (*)    MCH 25.4 (*)    RDW 17.0 (*)    Platelets 109 (*)    All other components within normal limits  PROTIME-INR  URINALYSIS, ROUTINE W REFLEX MICROSCOPIC  I-STAT  TROPONIN, ED    EKG EKG Interpretation  Date/Time:  Thursday November 28 2018 23:03:08 EST Ventricular Rate:  74 PR Interval:    QRS Duration: 184 QT Interval:  471 QTC Calculation: 523 R Axis:   97 Text Interpretation:  Sinus rhythm Probable left atrial enlargement Left bundle branch block agree. no sig change from old Confirmed by Charlesetta Shanks [phone]) on 11/28/2018 11:19:52 PM   Radiology Dg Chest 2  View  Result Date: 11/28/2018 CLINICAL DATA:  Bilateral leg swelling and pain. EXAM: CHEST - 2 VIEW COMPARISON:  12/03/2014. FINDINGS: Stable enlarged cardiac silhouette and tortuous and calcified thoracic aorta. Clear lungs with normal vascularity. Mild thoracic spine degenerative changes. IMPRESSION: No acute abnormality.  Stable cardiomegaly. Electronically Signed   By: Claudie Revering M.D.   On: 11/28/2018 22:09    Procedures Procedures (including critical care time)  Medications Ordered in ED Medications  potassium chloride 10 mEq in 100 mL IVPB (has no administration in time range)  potassium chloride SA (K-DUR,KLOR-CON) CR tablet 40 mEq (has no administration in time range)     Initial Impression / Assessment and Plan / ED Course  I have reviewed the triage vital signs and the nursing notes.  Pertinent labs & imaging results that were available during my care of the patient were reviewed by me and considered in my medical decision making (see chart for details).    Consult: Dr. Jonelle Sidle for admission  Patient presents with decreased appetite for 4 days and decreased oral intake.  She does have hypokalemia.  Patient has generalized weakness which is likely due to poor oral intake and hypokalemia.  She also cites left lower extremity pain.  There is some erythema and induration of the medial lower leg.  Considerations for DVT.  She does not have associated chest pain or shortness of breath.  No acute signs of pulmonary embolus.  Also, this may be chronic venous stasis or  cellulitis.  Patient does not have fever, leukocytosis, empiric antibiotics not started at this time.  Plan will be for admission for hydration, treatment of hypokalemia and further evaluation for lower extremity pain with redness and swelling.  Final Clinical Impressions(s) / ED Diagnoses   Final diagnoses:  Hypokalemia  Generalized weakness  Left leg pain  Anorexia    ED Discharge Orders    None       Charlesetta Shanks, MD 11/28/18 2351

## 2018-11-28 NOTE — H&P (Addendum)
History and Physical   Rachel Copeland MOQ:[id number] DOB: 07/05/1929 DOA: 11/28/2018  Referring MD/NP/PA: Dr. Johnney Killian  PCP: Lucianne Lei, MD   Outpatient Specialists: None  Patient coming from: Home  Chief Complaint: Left leg swelling  HPI: Rachel Copeland is a 84 y.o. female with medical history significant of hypertension, diabetes, hyperlipidemia, chronic venous stasis edema, diastolic dysfunction CHF, hyperlipidemia who presented to the ER with left greater than right lower extremity swelling and pain.  Left leg has been painful off to 7 out of 10 now.  The pain is sharp excruciating worse with activity.  Denied any fever or chills.  Denied any nausea vomiting or diarrhea.  Patient has not had any fall no injury.  She has been weak and lost appetite and has not eaten much in the last 4 days.  She has been taking on medications without any problem.  In the ER patient was found to have significant swelling of the left lower extremity with bilateral stasis edema.  Left leg is warm to touch and tender.  Suspicion for left lower extremity DVT versus cellulitis has been made.  Patient will need to have Doppler ultrasound tomorrow and she is being admitted into the hospital for observation until that is ruled out.  ED Course: Temperature 98 for blood pressure 163/53 pulse 80 respirate of 18 oxygen sats 95% room air.  White count 7.9 hemoglobin 11.5 platelets 109.  Sodium 136 potassium 2.8 chloride 101 CO2 27 BUN 21 and creatinine 1.26.  Glucose 155 INR 1.02.  BNP of 148.  Chest x-ray showed no active findings.  Patient has been given a dose of Lovenox and initiated on antibiotics.  She is being admitted overnight for observation and rule out DVT.  Review of Systems: As per HPI otherwise 10 point review of systems negative.    Past Medical History:  Diagnosis Date  . Diabetes mellitus    no medications  . Hyperlipidemia   . Hypertension     Past Surgical History:  Procedure  Laterality Date  . NO PAST SURGERIES       reports that she has never smoked. She has never used smokeless tobacco. She reports that she does not drink alcohol or use drugs.  Allergies  Allergen Reactions  . Sitagliptin Phosphate     REACTION: Finger and hand tingling RUQ pain    Family History  Problem Relation Age of Onset  . Colon cancer Mother   . Colon polyps Mother   . Diabetes Mellitus II Brother   . Diabetes Mellitus II Daughter   . Breast cancer Neg Hx   . Stomach cancer Neg Hx      Prior to Admission medications   Medication Sig Start Date End Date Taking? Authorizing Provider  amLODipine (NORVASC) 10 MG tablet Take 10 mg by mouth daily.   Yes [provider]  Ascorbic Acid (VITAMIN C) 1000 MG tablet Take 1,000 mg by mouth every morning.   Yes [provider]  aspirin 81 MG tablet Take 81 mg by mouth daily.   Yes [provider]  calcium-vitamin D (OSCAL WITH D) 500-200 MG-UNIT per tablet Take 1 tablet by mouth every morning.   Yes [provider]  carvedilol (COREG) 25 MG tablet Take 25 mg by mouth 2 (two) times daily with a meal.   Yes [provider]  fish oil-omega-3 fatty acids 1000 MG capsule Take 1 g by mouth every morning.   Yes [provider]  furosemide (LASIX) 40 MG tablet Take 1 tablet (40 mg total) by mouth every morning. 12/06/14  Yes Robbie Lis, MD  Multiple Vitamins-Minerals (MULTIVITAMIN ADULT PO) Take by mouth.   Yes [provider]  potassium chloride (K-DUR) 10 MEQ tablet Take 1 tablet (10 mEq total) by mouth daily. 12/06/14  Yes Robbie Lis, MD  valsartan-hydrochlorothiazide (DIOVAN-HCT) 320-25 MG per tablet Take 1 tablet by mouth daily.   Yes [provider]    Physical Exam: Vitals:   11/28/18 2130  BP: (!) 163/53  Pulse: 80  Resp: 18  Temp: 98.4 F (36.9 C)  TempSrc: Oral  SpO2: 95%  Weight: 86.2 kg  Height: 5\' 5"  (1.651 m)      Constitutional: NAD,  calm, comfortable Vitals:   11/28/18 2130  BP: (!) 163/53  Pulse: 80  Resp: 18  Temp: 98.4 F (36.9 C)  TempSrc: Oral  SpO2: 95%  Weight: 86.2 kg  Height: 5\' 5"  (1.651 m)   Eyes: PERRL, lids and conjunctivae normal ENMT: Mucous membranes are moist. Posterior pharynx clear of any exudate or lesions.Normal dentition.  Neck: normal, supple, no masses, no thyromegaly Respiratory: clear to auscultation bilaterally, no wheezing, no crackles. Normal respiratory effort. No accessory muscle use.  Cardiovascular: Regular rate and rhythm, no murmurs / rubs / gallops. 1+ extremity edema. 2+ pedal pulses. No carotid bruits.  Abdomen: no tenderness, no masses palpated. No hepatosplenomegaly. Bowel sounds positive.  Musculoskeletal: no clubbing / cyanosis. No joint deformity upper and lower extremities. Good ROM, no contractures. Normal muscle tone.  Left lower extremity is swollen, tender to touch, significant edema Skin: no rashes, lesions, ulcers. No induration Neurologic: CN 2-12 grossly intact. Sensation intact, DTR normal. Strength 5/5 in all 4.  Psychiatric: Normal judgment and insight. Alert and oriented x 3. Normal mood.     Labs on Admission: I have personally reviewed following labs and imaging studies  CBC: Recent Labs  Lab 11/28/18 2214  WBC 7.9  NEUTROABS 5.6  HGB 11.5*  HCT 36.6  MCV 80.8  PLT 528*   Basic Metabolic Panel: Recent Labs  Lab 11/28/18 2214  NA 136  K 2.8*  CL 101  CO2 27  GLUCOSE 155*  BUN 21  CREATININE 1.26*  CALCIUM 9.4   GFR: Estimated Creatinine Clearance: 34.1 mL/min (A) (by C-G formula based on SCr of 1.26 mg/dL (H)). Liver Function Tests: Recent Labs  Lab 11/28/18 2214  AST 21  ALT 18  ALKPHOS 52  BILITOT 0.8  PROT 7.3  ALBUMIN 3.6   No results for input(s): LIPASE, AMYLASE in the last 168 hours. No results for input(s): AMMONIA in the last 168 hours. Coagulation Profile: Recent Labs  Lab 11/28/18 2214  INR 1.02   Cardiac  Enzymes: No results for input(s): CKTOTAL, CKMB, CKMBINDEX, TROPONINI in the last 168 hours. BNP (last 3 results) No results for input(s): PROBNP in the last 8760 hours. HbA1C: No results for input(s): HGBA1C in the last 72 hours. CBG: No results for input(s): GLUCAP in the last 168 hours. Lipid Profile: No results for input(s): CHOL, HDL, LDLCALC, TRIG, CHOLHDL, LDLDIRECT in the last 72 hours. Thyroid Function Tests: No results for input(s): TSH, T4TOTAL, FREET4, T3FREE, THYROIDAB in the last 72 hours. Anemia Panel: No results for input(s): VITAMINB12, FOLATE, FERRITIN, TIBC, IRON, RETICCTPCT in the last 72 hours. Urine analysis:    Component Value Date/Time   COLORURINE YELLOW 12/01/2014 2103   APPEARANCEUR CLEAR 12/01/2014 2103   LABSPEC 1.011 12/01/2014 2103  PHURINE 7.5 12/01/2014 2103   GLUCOSEU 250 (A) 12/01/2014 2103   HGBUR NEGATIVE 12/01/2014 2103   HGBUR negative 04/22/2008 1009   BILIRUBINUR NEGATIVE 12/01/2014 2103   KETONESUR NEGATIVE 12/01/2014 2103   PROTEINUR NEGATIVE 12/01/2014 2103   UROBILINOGEN 0.2 12/01/2014 2103   NITRITE NEGATIVE 12/01/2014 2103   LEUKOCYTESUR NEGATIVE 12/01/2014 2103   Sepsis Labs: @LABRCNTIP (procalcitonin:4,lacticidven:4) )No results found for this or any previous visit (from the past 240 hour(s)).   Radiological Exams on Admission: Dg Chest 2 View  Result Date: 11/28/2018 CLINICAL DATA:  Bilateral leg swelling and pain. EXAM: CHEST - 2 VIEW COMPARISON:  12/03/2014. FINDINGS: Stable enlarged cardiac silhouette and tortuous and calcified thoracic aorta. Clear lungs with normal vascularity. Mild thoracic spine degenerative changes. IMPRESSION: No acute abnormality.  Stable cardiomegaly. Electronically Signed   By: Claudie Revering M.D.   On: 11/28/2018 22:09    EKG: Independently reviewed.  It shows normal sinus rhythm with left bundle branch block.  Is unchanged from previous.  Assessment/Plan Principal Problem:   Localized swelling  of left lower extremity Active Problems:   Diabetes mellitus type 2, controlled (HCC)   HTN (hypertension)   Chronic diastolic CHF (congestive heart failure) (HCC)   Thrombocytopenia (HCC)   Chronic kidney disease (CKD), stage IV (severe) (HCC)   Hypokalemia     #1 left lower extremity swelling and tenderness: Worrisome for DVT.  Cellulitis also in the differential.  Patient will be started on subacute Lovenox therapeutic dose tonight.  Venous Doppler ultrasound will be done in the morning.  If blood clot is confirmed she will be transition to oral anticoagulants.  No history of bleeding.  Empirically also start her on IV Ancef.  If infection is confirmed with negative DVT will continue antibiotics.  #2 diabetes: Initiate sliding scale insulin.  She is currently diet controlled.  #3 chronic CHF: On diuretics.  Elevate feet and continue home regimen  #4 hypertension: Continue with home regimen.  #5 severe hypokalemia: Probably due to diuretics and poor appetite.  Replete aggressively.  #6 chronic kidney disease stage III: Appears to be at baseline.  #7 thrombocytopenia: Chronic in nature.  Continue monitoring especially with Lovenox.   DVT prophylaxis: Lovenox Code Status: Full code Family Communication: 3 grandchildren with her at bedside Disposition Plan: Home Consults called: None Admission status: Observation  Severity of Illness: The appropriate patient status for this patient is OBSERVATION. Observation status is judged to be reasonable and necessary in order to provide the required intensity of service to ensure the patient's safety. The patient's presenting symptoms, physical exam findings, and initial radiographic and laboratory data in the context of their medical condition is felt to place them at decreased risk for further clinical deterioration. Furthermore, it is anticipated that the patient will be medically stable for discharge from the hospital within 2 midnights of  admission. The following factors support the patient status of observation.   " The patient's presenting symptoms include left lower extremity swelling and pain. " The physical exam findings include swollen tender left lower extremity. " The initial radiographic and laboratory data are no obvious evidence of abnormalities on chest x-ray.     Barbette Merino MD [facility] Pager [phone]  If 7PM-7AM, please contact night-coverage [email or web address] Password [email or web address]  11/28/2018, 11:58 PM

## 2018-11-28 NOTE — ED Triage Notes (Signed)
Pt comes to ed co of bilateral leg pain and right knee pain. Dependent edema.  Pt feels generalized weakness and not eating much. Pt comes from home.  V/s 152/68, hr 70 rr16, cbg 175.  Pt HTN, COPD.  Walks, grand daughter on the way.

## 2018-11-28 NOTE — ED Notes (Signed)
Wille Glaser, RN at bedside starting IV.

## 2018-11-28 NOTE — ED Notes (Addendum)
Pt attempted to give urine sample but unable to at this time. 

## 2018-11-28 NOTE — ED Notes (Addendum)
Pt ambulated to BR with no assist; tolerated well.  

## 2018-11-28 NOTE — ED Notes (Signed)
MD at bedside. Pt undressed in gown.

## 2018-11-28 NOTE — ED Notes (Signed)
Bed: [facility] Expected date:  Expected time:  Means of arrival:  Comments: EMS 84 yo female with generalized malaise/lower extremity pain and anorexia for 4 days

## 2018-11-29 ENCOUNTER — Observation Stay (HOSPITAL_BASED_OUTPATIENT_CLINIC_OR_DEPARTMENT_OTHER): Payer: Medicare Other

## 2018-11-29 ENCOUNTER — Other Ambulatory Visit: Payer: Self-pay

## 2018-11-29 ENCOUNTER — Encounter (HOSPITAL_COMMUNITY): Payer: Self-pay

## 2018-11-29 DIAGNOSIS — M7989 Other specified soft tissue disorders: Secondary | ICD-10-CM | POA: Diagnosis not present

## 2018-11-29 DIAGNOSIS — L03116 Cellulitis of left lower limb: Secondary | ICD-10-CM | POA: Diagnosis not present

## 2018-11-29 DIAGNOSIS — R2242 Localized swelling, mass and lump, left lower limb: Secondary | ICD-10-CM | POA: Diagnosis not present

## 2018-11-29 LAB — URINALYSIS, ROUTINE W REFLEX MICROSCOPIC
Bilirubin Urine: NEGATIVE
Glucose, UA: NEGATIVE mg/dL
KETONES UR: NEGATIVE mg/dL
Nitrite: NEGATIVE
Protein, ur: NEGATIVE mg/dL
Specific Gravity, Urine: 1.008 (ref 1.005–1.030)
pH: 5 (ref 5.0–8.0)

## 2018-11-29 LAB — COMPREHENSIVE METABOLIC PANEL
ALT: 15 U/L (ref 0–44)
AST: 18 U/L (ref 15–41)
Albumin: 3.5 g/dL (ref 3.5–5.0)
Alkaline Phosphatase: 53 U/L (ref 38–126)
Anion gap: 9 (ref 5–15)
BILIRUBIN TOTAL: 0.8 mg/dL (ref 0.3–1.2)
BUN: 20 mg/dL (ref 8–23)
CO2: 25 mmol/L (ref 22–32)
Calcium: 9.1 mg/dL (ref 8.9–10.3)
Chloride: 104 mmol/L (ref 98–111)
Creatinine, Ser: 1.1 mg/dL — ABNORMAL HIGH (ref 0.44–1.00)
GFR calc Af Amer: 52 mL/min — ABNORMAL LOW (ref >60)
GFR calc non Af Amer: 45 mL/min — ABNORMAL LOW (ref >60)
Glucose, Bld: 162 mg/dL — ABNORMAL HIGH (ref 70–99)
Potassium: 3.3 mmol/L — ABNORMAL LOW (ref 3.5–5.1)
Sodium: 138 mmol/L (ref 135–145)
Total Protein: 7 g/dL (ref 6.5–8.1)

## 2018-11-29 LAB — CBC
HCT: 35.5 % — ABNORMAL LOW (ref 36.0–46.0)
Hemoglobin: 11.2 g/dL — ABNORMAL LOW (ref 12.0–15.0)
MCH: 25.8 pg — ABNORMAL LOW (ref 26.0–34.0)
MCHC: 31.5 g/dL (ref 30.0–36.0)
MCV: 81.8 fL (ref 80.0–100.0)
Platelets: 105 10*3/uL — ABNORMAL LOW (ref 150–400)
RBC: 4.34 MIL/uL (ref 3.87–5.11)
RDW: 16.9 % — ABNORMAL HIGH (ref 11.5–15.5)
WBC: 7.5 10*3/uL (ref 4.0–10.5)
nRBC: 0 % (ref 0.0–0.2)

## 2018-11-29 LAB — GLUCOSE, CAPILLARY
GLUCOSE-CAPILLARY: 124 mg/dL — AB (ref 70–99)
Glucose-Capillary: 137 mg/dL — ABNORMAL HIGH (ref 70–99)
Glucose-Capillary: 116 mg/dL — ABNORMAL HIGH (ref 70–99)
Glucose-Capillary: 175 mg/dL — ABNORMAL HIGH (ref 70–99)
Glucose-Capillary: 160 mg/dL — ABNORMAL HIGH (ref 70–99)

## 2018-11-29 MED ORDER — ACETAMINOPHEN 650 MG RE SUPP: 650.0000 mg | Freq: Four times a day (QID) | RECTAL | Status: DC | PRN

## 2018-11-29 MED ORDER — POTASSIUM CHLORIDE CRYS ER 20 MEQ PO TBCR
40.0000 meq | EXTENDED_RELEASE_TABLET | Freq: Three times a day (TID) | ORAL | Status: DC
  Administered 2018-11-29: 40 meq via ORAL
  Filled 2018-11-29: qty 2

## 2018-11-29 MED ORDER — ASPIRIN EC 81 MG PO TBEC
81.0000 mg | DELAYED_RELEASE_TABLET | Freq: Every day | ORAL | Status: DC
  Administered 2018-11-29 – 2018-11-30 (×2): 81 mg via ORAL
  Filled 2018-11-29 (×2): qty 1

## 2018-11-29 MED ORDER — SODIUM CHLORIDE 0.9 % IV SOLN: 250.0000 mL | INTRAVENOUS | Status: DC | PRN

## 2018-11-29 MED ORDER — OCUVITE-LUTEIN PO CAPS
1.0000 | ORAL_CAPSULE | Freq: Every day | ORAL | Status: DC
  Filled 2018-11-29: qty 1

## 2018-11-29 MED ORDER — ENSURE ENLIVE PO LIQD
237.0000 mL | Freq: Two times a day (BID) | ORAL | Status: DC
  Administered 2018-11-29: 237 mL via ORAL

## 2018-11-29 MED ORDER — VALSARTAN-HYDROCHLOROTHIAZIDE 320-25 MG PO TABS: 1.0000 | ORAL_TABLET | Freq: Every day | ORAL | Status: DC

## 2018-11-29 MED ORDER — POTASSIUM CHLORIDE CRYS ER 20 MEQ PO TBCR
20.0000 meq | EXTENDED_RELEASE_TABLET | Freq: Every day | ORAL | Status: DC
  Administered 2018-11-30: 20 meq via ORAL
  Filled 2018-11-29: qty 1

## 2018-11-29 MED ORDER — INSULIN ASPART 100 UNIT/ML ~~LOC~~ SOLN
0.0000 [IU] | Freq: Three times a day (TID) | SUBCUTANEOUS | Status: DC
  Administered 2018-11-29: 1 [IU] via SUBCUTANEOUS

## 2018-11-29 MED ORDER — AMLODIPINE BESYLATE 10 MG PO TABS
10.0000 mg | ORAL_TABLET | Freq: Every day | ORAL | Status: DC
  Administered 2018-11-29 – 2018-11-30 (×2): 10 mg via ORAL
  Filled 2018-11-29 (×2): qty 1

## 2018-11-29 MED ORDER — ACETAMINOPHEN 325 MG PO TABS: 650.0000 mg | ORAL_TABLET | Freq: Four times a day (QID) | ORAL | Status: DC | PRN

## 2018-11-29 MED ORDER — SODIUM CHLORIDE 0.9% FLUSH: 3.0000 mL | Freq: Two times a day (BID) | INTRAVENOUS | Status: DC

## 2018-11-29 MED ORDER — ONDANSETRON HCL 4 MG PO TABS: 4.0000 mg | ORAL_TABLET | Freq: Four times a day (QID) | ORAL | Status: DC | PRN

## 2018-11-29 MED ORDER — OMEGA-3-ACID ETHYL ESTERS 1 G PO CAPS
1.0000 g | ORAL_CAPSULE | Freq: Every morning | ORAL | Status: DC
  Administered 2018-11-29 – 2018-11-30 (×2): 1 g via ORAL
  Filled 2018-11-29 (×2): qty 1

## 2018-11-29 MED ORDER — CARVEDILOL 25 MG PO TABS
25.0000 mg | ORAL_TABLET | Freq: Two times a day (BID) | ORAL | Status: DC
  Administered 2018-11-29 – 2018-11-30 (×3): 25 mg via ORAL
  Filled 2018-11-29 (×3): qty 1

## 2018-11-29 MED ORDER — SODIUM CHLORIDE 0.9% FLUSH: 3.0000 mL | INTRAVENOUS | Status: DC | PRN

## 2018-11-29 MED ORDER — ONDANSETRON HCL 4 MG/2ML IJ SOLN: 4.0000 mg | Freq: Four times a day (QID) | INTRAMUSCULAR | Status: DC | PRN

## 2018-11-29 MED ORDER — FUROSEMIDE 40 MG PO TABS
40.0000 mg | ORAL_TABLET | Freq: Every morning | ORAL | Status: DC
  Administered 2018-11-29 – 2018-11-30 (×2): 40 mg via ORAL
  Filled 2018-11-29 (×2): qty 1

## 2018-11-29 MED ORDER — ENOXAPARIN SODIUM 100 MG/ML ~~LOC~~ SOLN
85.0000 mg | Freq: Two times a day (BID) | SUBCUTANEOUS | Status: DC
  Administered 2018-11-29 (×2): 85 mg via SUBCUTANEOUS
  Filled 2018-11-29: qty 0.85
  Filled 2018-11-29 (×2): qty 1

## 2018-11-29 MED ORDER — HYDROCHLOROTHIAZIDE 25 MG PO TABS
25.0000 mg | ORAL_TABLET | Freq: Every day | ORAL | Status: DC
  Administered 2018-11-29 – 2018-11-30 (×2): 25 mg via ORAL
  Filled 2018-11-29 (×2): qty 1

## 2018-11-29 MED ORDER — IRBESARTAN 150 MG PO TABS
300.0000 mg | ORAL_TABLET | Freq: Every day | ORAL | Status: DC
  Administered 2018-11-29 – 2018-11-30 (×2): 300 mg via ORAL
  Filled 2018-11-29 (×2): qty 2

## 2018-11-29 MED ORDER — ENOXAPARIN SODIUM 40 MG/0.4ML ~~LOC~~ SOLN
40.0000 mg | SUBCUTANEOUS | Status: DC
  Administered 2018-11-30: 40 mg via SUBCUTANEOUS
  Filled 2018-11-29: qty 0.4

## 2018-11-29 MED ORDER — HYDROCODONE-ACETAMINOPHEN 5-325 MG PO TABS
1.0000 | ORAL_TABLET | ORAL | Status: DC | PRN
  Administered 2018-11-29 – 2018-11-30 (×3): 1 via ORAL
  Filled 2018-11-29: qty 1
  Filled 2018-11-29: qty 2
  Filled 2018-11-29: qty 1

## 2018-11-29 MED ORDER — CEFAZOLIN SODIUM-DEXTROSE 1-4 GM/50ML-% IV SOLN
1.0000 g | Freq: Three times a day (TID) | INTRAVENOUS | Status: DC
  Administered 2018-11-29 – 2018-11-30 (×3): 1 g via INTRAVENOUS
  Filled 2018-11-29 (×5): qty 50

## 2018-11-29 MED ORDER — PROSIGHT PO TABS
1.0000 | ORAL_TABLET | Freq: Every day | ORAL | Status: DC
  Administered 2018-11-29 – 2018-11-30 (×2): 1 via ORAL
  Filled 2018-11-29 (×2): qty 1

## 2018-11-29 NOTE — Progress Notes (Signed)
LE venous duplex       has been completed. Preliminary results can be found under CV proc through chart review. Kenroy Timberman Eunice, RDMS, RVT   

## 2018-11-29 NOTE — Progress Notes (Signed)
ANTICOAGULATION CONSULT NOTE - Initial Consult  Pharmacy Consult for enoxaparin Indication: VTE treatment  Allergies  Allergen Reactions  . Sitagliptin Phosphate     REACTION: Finger and hand tingling RUQ pain    Patient Measurements: Height: 5\' 5"  (165.1 cm) Weight: 190 lb (86.2 kg) IBW/kg (Calculated) : 57 Heparin Dosing Weight:   Vital Signs: Temp: 98 F (36.7 C) (12/06 0101) Temp Source: Oral (12/06 0101) BP: 189/73 (12/06 0101) Pulse Rate: 78 (12/06 0101)  Labs: Recent Labs    11/28/18 2214  HGB 11.5*  HCT 36.6  PLT 109*  LABPROT 13.3  INR 1.02  CREATININE 1.26*    Estimated Creatinine Clearance: 34.1 mL/min (A) (by C-G formula based on SCr of 1.26 mg/dL (H)).   Medical History: Past Medical History:  Diagnosis Date  . CHF (congestive heart failure) ([place])   . Diabetes mellitus    no medications  . Hyperlipidemia   . Hypertension     Medications:  Infusions:  . sodium chloride 10 mL/hr at 11/29/18 0304    Assessment: Patient with possible DVT.    Goal of Therapy:  Anti-Xa level 0.6-1 units/ml 4hrs after LMWH dose given Monitor platelets by anticoagulation protocol: Yes   Plan:  Enoxaparin 85mg  sq q12hr   Tyler Deis, Shea Stakes Crowford 11/29/2018,5:12 AM

## 2018-11-29 NOTE — Progress Notes (Addendum)
PROGRESS NOTE  [name] GNF:[id number] DOB: 01/13/1929 DOA: 11/28/2018 PCP: Lucianne Lei, MD  HPI/Recap of past 24 hours: Rachel Copeland is a 84 y.o. female with medical history significant of hypertension, diabetes, hyperlipidemia, chronic venous stasis edema, diastolic dysfunction CHF, hyperlipidemia who presented to the ER with left greater than right lower extremity swelling and pain.  Left leg has been painful off to 7 out of 10 now.  The pain is sharp excruciating worse with activity.  Denied any fever or chills.  Denied any nausea vomiting or diarrhea.  Patient has not had any fall no injury.  She has been weak and lost appetite and has not eaten much in the last 4 days.  She has been taking on medications without any problem.  In the ER patient was found to have significant swelling of the left lower extremity with bilateral stasis edema.  Left leg is warm to touch and tender.  Suspicion for left lower extremity DVT versus cellulitis has been made.  Patient will need to have Doppler ultrasound tomorrow and she is being admitted into the hospital for observation until that is ruled out.  11/29/2018: Patient seen and examined at her bedside.  Reports persistent tenderness and pain in her left lower extremity with warmth and swelling.  States having difficulty bearing weight on the left leg due to significant pain.  Assessment/Plan: Principal Problem:   Localized swelling of left lower extremity Active Problems:   Diabetes mellitus type 2, controlled (HCC)   HTN (hypertension)   Chronic diastolic CHF (congestive heart failure) (HCC)   Thrombocytopenia (HCC)   Chronic kidney disease (CKD), stage IV (severe) (HCC)   Hypokalemia  Left lower extremity cellulitis, failed outpatient treatment Self-reported warmth, swelling and tenderness on palpation Stated she was previously treated with oral antibiotics about a month ago unsuccessfully Started IV antibiotics empirically;  Ancef.  Day #1 Bilateral lower extremity duplex ultrasound negative for DVT Pain management and bowel regimen in place Monitor fever curve and WBC Obtain CBC in the morning  Hypertension Blood pressures well controlled Continue home antihypertensive medications Currently on amlodipine 10 mg daily, Coreg 25 mg twice daily, furosemide 40 mg daily, HCTZ 25 mg daily, irbesartan 3 mg daily  Hypokalemia Presented with a potassium of 2.8 Continue to replete as indicated Continue on maintenance potassium supplement while on Lasix Repeat BMP in the morning  Type 2 diabetes with hyperglycemia Last A1c in 2015 was 7.9 Repeat A1c Continue insulin sliding scale  CKD 3 Appears to be at her baseline creatinine of 1.1 and GFR of 52  Chronic thrombocytopenia Pellety stable at 105K  Obesity BMI 31 Recommend weight loss outpatient  Chronic diastolic CHF Last 2D echo done on 12/02/2014 revealed preserved LVEF with grade 1 diastolic dysfunction Resume cardiac medications Strict I's and O's and daily weight  Physical debility/ambulatory dysfunction Self-reported having difficulty walking with tenderness in her left lower extremity PT to assess Fall precautions  Risks: High risk for decompensation in the setting of cellulitis with prior failure of outpatient therapy, difficulty bearing weight on her left lower extremity due to tenderness from cellulitis increasing her risk for falls, multiple comorbidities, and advanced age.   DVT prophylaxis: Lovenox Code Status: Full code Family Communication:  None at bedside Disposition Plan: Home when able to ambulate safely Consults called: None Admission status: Observation   Objective: Vitals:   11/29/18 0032 11/29/18 0101 11/29/18 0526 11/29/18 1347  BP: (!) 171/60 (!) 189/73 (!) 144/55 (!) 142/52  Pulse: 73 78 69 64  Resp: 16 20 18 18   Temp:  98 F (36.7 C) 98.1 F (36.7 C) 98.2 F (36.8 C)  TempSrc:  Oral Oral Oral  SpO2: 97% 100%  95% 95%  Weight:      Height:        Intake/Output Summary (Last 24 hours) at 11/29/2018 1524 Last data filed at 11/29/2018 1300 Gross per 24 hour  Intake 350.28 ml  Output 1000 ml  Net -649.72 ml   Filed Weights   11/28/18 2130  Weight: 86.2 kg    Exam:  . General: 84 y.o. year-old female well developed well nourished in no acute distress.  Alert and oriented x3. . Cardiovascular: Regular rate and rhythm with no rubs or gallops.  No thyromegaly or JVD noted.   Marland Kitchen Respiratory: Clear to auscultation with no wheezes or rales. Good inspiratory effort. . Abdomen: Soft nontender nondistended with normal bowel sounds x4 quadrants. . Musculoskeletal: No lower extremity edema. 2/4 pulses in all 4 extremities. . Skin: Left lower extremity with warmth, erythema and tenderness on palpation. Marland Kitchen Psychiatry: Mood is appropriate for condition and setting   Data Reviewed: CBC: Recent Labs  Lab 11/28/18 2214 11/29/18 0858  WBC 7.9 7.5  NEUTROABS 5.6  --   HGB 11.5* 11.2*  HCT 36.6 35.5*  MCV 80.8 81.8  PLT 109* 938*   Basic Metabolic Panel: Recent Labs  Lab 11/28/18 2214 11/29/18 0858  NA 136 138  K 2.8* 3.3*  CL 101 104  CO2 27 25  GLUCOSE 155* 162*  BUN 21 20  CREATININE 1.26* 1.10*  CALCIUM 9.4 9.1   GFR: Estimated Creatinine Clearance: 39.1 mL/min (A) (by C-G formula based on SCr of 1.1 mg/dL (H)). Liver Function Tests: Recent Labs  Lab 11/28/18 2214 11/29/18 0858  AST 21 18  ALT 18 15  ALKPHOS 52 53  BILITOT 0.8 0.8  PROT 7.3 7.0  ALBUMIN 3.6 3.5   No results for input(s): LIPASE, AMYLASE in the last 168 hours. No results for input(s): AMMONIA in the last 168 hours. Coagulation Profile: Recent Labs  Lab 11/28/18 2214  INR 1.02   Cardiac Enzymes: No results for input(s): CKTOTAL, CKMB, CKMBINDEX, TROPONINI in the last 168 hours. BNP (last 3 results) No results for input(s): PROBNP in the last 8760 hours. HbA1C: No results for input(s): HGBA1C in the  last 72 hours. CBG: Recent Labs  Lab 11/29/18 0111 11/29/18 0730 11/29/18 1114  GLUCAP 137* 116* 175*   Lipid Profile: No results for input(s): CHOL, HDL, LDLCALC, TRIG, CHOLHDL, LDLDIRECT in the last 72 hours. Thyroid Function Tests: No results for input(s): TSH, T4TOTAL, FREET4, T3FREE, THYROIDAB in the last 72 hours. Anemia Panel: No results for input(s): VITAMINB12, FOLATE, FERRITIN, TIBC, IRON, RETICCTPCT in the last 72 hours. Urine analysis:    Component Value Date/Time   COLORURINE YELLOW 11/29/2018 [place] 11/29/2018 0414   LABSPEC 1.008 11/29/2018 0414   PHURINE 5.0 11/29/2018 0414   GLUCOSEU NEGATIVE 11/29/2018 0414   HGBUR SMALL (A) 11/29/2018 0414   HGBUR negative 04/22/2008 1009   BILIRUBINUR NEGATIVE 11/29/2018 0414   KETONESUR NEGATIVE 11/29/2018 0414   PROTEINUR NEGATIVE 11/29/2018 0414   UROBILINOGEN 0.2 12/01/2014 2103   NITRITE NEGATIVE 11/29/2018 0414   LEUKOCYTESUR TRACE (A) 11/29/2018 0414   Sepsis Labs: @LABRCNTIP (procalcitonin:4,lacticidven:4)  )No results found for this or any previous visit (from the past 240 hour(s)).    Studies: Dg Chest 2 View  Result Date: 11/28/2018  CLINICAL DATA:  Bilateral leg swelling and pain. EXAM: CHEST - 2 VIEW COMPARISON:  12/03/2014. FINDINGS: Stable enlarged cardiac silhouette and tortuous and calcified thoracic aorta. Clear lungs with normal vascularity. Mild thoracic spine degenerative changes. IMPRESSION: No acute abnormality.  Stable cardiomegaly. Electronically Signed   By: Claudie Revering M.D.   On: 11/28/2018 22:09   Vas [place] Lower Extremity Venous (dvt)  Result Date: 11/29/2018  Lower Venous Study Indications: Swelling.  Performing Technologist: Landry Mellow RDMS, RVT  Examination Guidelines: A complete evaluation includes B-mode imaging, spectral Doppler, color Doppler, and power Doppler as needed of all accessible portions of each vessel. Bilateral testing is considered an integral part of a  complete examination. Limited examinations for reoccurring indications may be performed as noted.  Right Venous Findings: +---------+---------------+---------+-----------+----------+-------+          CompressibilityPhasicitySpontaneityPropertiesSummary +---------+---------------+---------+-----------+----------+-------+ CFV      Full           Yes      Yes                          +---------+---------------+---------+-----------+----------+-------+ SFJ      Full                                                 +---------+---------------+---------+-----------+----------+-------+ FV Prox  Full                                                 +---------+---------------+---------+-----------+----------+-------+ FV Mid   Full                                                 +---------+---------------+---------+-----------+----------+-------+ FV DistalFull                                                 +---------+---------------+---------+-----------+----------+-------+ PFV      Full                                                 +---------+---------------+---------+-----------+----------+-------+ POP      Full           Yes      Yes                          +---------+---------------+---------+-----------+----------+-------+ PTV      Full                                                 +---------+---------------+---------+-----------+----------+-------+ PERO     Full                                                 +---------+---------------+---------+-----------+----------+-------+  PTV, pero not well visualized  Left Venous Findings: +---------+---------------+---------+-----------+----------+-------+          CompressibilityPhasicitySpontaneityPropertiesSummary +---------+---------------+---------+-----------+----------+-------+ CFV      Full           Yes      Yes                           +---------+---------------+---------+-----------+----------+-------+ SFJ      Full                                                 +---------+---------------+---------+-----------+----------+-------+ FV Prox  Full                                                 +---------+---------------+---------+-----------+----------+-------+ FV Mid   Full                                                 +---------+---------------+---------+-----------+----------+-------+ FV DistalFull                                                 +---------+---------------+---------+-----------+----------+-------+ PFV      Full                                                 +---------+---------------+---------+-----------+----------+-------+ POP      Full           Yes      Yes                          +---------+---------------+---------+-----------+----------+-------+ PTV      Full                                                 +---------+---------------+---------+-----------+----------+-------+ PERO     Full                                                 +---------+---------------+---------+-----------+----------+-------+ PTV, pero not well visualized    Summary: Right: There is no evidence of deep vein thrombosis in the lower extremity. However, portions of this examination were limited- see technologist comments above. No cystic structure found in the popliteal fossa. Left: There is no evidence of deep vein thrombosis in the lower extremity. However, portions of this examination were limited- see technologist comments above. No cystic structure found in the popliteal fossa.  *See table(s) above for measurements and observations.    Preliminary     Scheduled Meds: . amLODipine  10 mg Oral  Daily  . aspirin EC  81 mg Oral Daily  . carvedilol  25 mg Oral BID WC  . [START ON 11/30/2018] enoxaparin (LOVENOX) injection  40 mg Subcutaneous Q24H  . feeding supplement (ENSURE ENLIVE)   237 mL Oral BID BM  . furosemide  40 mg Oral q morning - 10a  . hydrochlorothiazide  25 mg Oral Daily  . insulin aspart  0-9 Units Subcutaneous TID WC  . irbesartan  300 mg Oral Daily  . multivitamin-lutein  1 capsule Oral Daily  . omega-3 acid ethyl esters  1 g Oral q morning - 10a  . [START ON 11/30/2018] potassium chloride  20 mEq Oral Daily  . sodium chloride flush  3 mL Intravenous Q12H    Continuous Infusions: . sodium chloride 10 mL/hr at 11/29/18 0304  .  ceFAZolin (ANCEF) IV 1 g (11/29/18 1149)     LOS: 0 days     Kayleen Memos, MD [facility] Pager [phone]  If 7PM-7AM, please contact night-coverage [email or web address] Password [email or web address] 11/29/2018, 3:24 PM

## 2018-11-29 NOTE — Progress Notes (Signed)
Initial Nutrition Assessment  DOCUMENTATION CODES:   Not applicable  INTERVENTION:  Ensure Enlive po BID, each supplement provides 350 kcal and 20 grams of protein (pt requests chocolate or strawberry) MVI    NUTRITION DIAGNOSIS:   Inadequate oral intake related to decreased appetite, acute illness(bilateral statis edema) as evidenced by per patient/family report, percent weight loss, energy intake < or equal to 75% for > or equal to 1 month.  GOAL:   Patient will meet greater than or equal to 90% of their needs  MONITOR:   PO intake, Supplement acceptance, Weight trends, Labs, Skin  REASON FOR ASSESSMENT:   Malnutrition Screening Tool    ASSESSMENT:  84 year old female with known history of HTN, DM, HLD, chronic venous stasis edema, CHF,HLD, CKD3, admitted with significant swelling of lower left extremity and bilateral statis edema.   Pt reports feeling better this afternoon and endorses improvements to appetite. She reports eating all of her fish, fruit cup, and half of the creamed pots because she did not care for them. Patient reports 3 meals/day at home. She prepares oatmeal with a banana and coffee for breakfast w/ bacon on occasion. Lunch is prepared for the patient and she reports a variety of items; pintos, broccoli, meatloaf, string beans, and cabbage. Dinner is usually leftovers or a cup of soup and sandwich.   Patient recalls UBW of 200lbs approximately 2 months ago, stating she has noticed her appetite has recently gotten away from her.  Medications Reviewed and Include: Lasix, hydrochlorothiazide, novoLog (0-9 units) with meals, omega 3, KCl 34mEq tablet  Labs: K 3.3 (L) replacing, Glucose 162 (H) Lab Results  Component Value Date   HGBA1C 7.9 (H) 12/03/2014    12/6: VAS [place] Lower Extremity:  no evidence of deep vein thrombosis in the lower extremity. However, portions of this examination were limited-. No cystic structure found in the popliteal  fossa.   NUTRITION - FOCUSED PHYSICAL EXAM:    Most Recent Value  Orbital Region  No depletion  Upper Arm Region  No depletion  Thoracic and Lumbar Region  No depletion  Buccal Region  Mild depletion  Temple Region  Mild depletion  Clavicle Bone Region  No depletion  Clavicle and Acromion Bone Region  No depletion  Scapular Bone Region  No depletion  Dorsal Hand  Mild depletion  Patellar Region  Unable to assess  Anterior Thigh Region  No depletion  Posterior Calf Region  Unable to assess [patient with edema to LE]  Edema (RD Assessment)  Moderate  Hair  Reviewed  Eyes  Reviewed  Mouth  Reviewed  Skin  Reviewed  Nails  Reviewed       Diet Order:  50% of meals Diet Order            Diet Heart Room service appropriate? Yes; Fluid consistency: Thin  Diet effective now              EDUCATION NEEDS:   No education needs have been identified at this time  Skin:  Skin Assessment: Reviewed RN Assessment  Last BM:  11/29/2018: Type 5, Brown; Medium  Height:   Ht Readings from Last 1 Encounters:  11/28/18 5\' 5"  (1.651 m)    Weight:   Wt Readings from Last 1 Encounters:  11/28/18 86.2 kg    Ideal Body Weight:  56.8 kg  BMI:  Body mass index is 31.62 kg/m.  Estimated Nutritional Needs:   Kcal:  3428-7681 (MSJ 1.2-1.4)  Protein:  104-129g  Fluid:  </=1.6L/day    Lajuan Lines, RD, LDN  After Hours/Weekend Pager: [phone]

## 2018-11-29 NOTE — ED Notes (Signed)
ED TO INPATIENT HANDOFF REPORT  Name/Age/Gender Rachel Copeland 84 y.o. female  Code Status Code Status History    Date Active Date Inactive Code Status Order ID Comments User Context   12/01/2014 2313 12/06/2014 1639 Full Code [id number]  Rise Patience, MD Inpatient      Home/SNF/Other Nursing Home  Chief Complaint Malaise, Lower extremity pain  Level of Care/Admitting Diagnosis ED Disposition    ED Disposition Condition [facility] Area: [facility] [[id number]]  Level of Care: Med-Surg [16]  Diagnosis: Localized swelling of left lower extremity [3419622]  Admitting Physician: Elwyn Reach [[id number]]  Attending Physician: Elwyn Reach [[id number]]  PT Class (Do Not Modify): Observation [104]  PT Acc Code (Do Not Modify): Observation [10022]       Medical History Past Medical History:  Diagnosis Date  . Diabetes mellitus    no medications  . Hyperlipidemia   . Hypertension     Allergies Allergies  Allergen Reactions  . Sitagliptin Phosphate     REACTION: Finger and hand tingling RUQ pain    IV Location/Drains/Wounds Patient Lines/Drains/Airways Status   Active Line/Drains/Airways    Name:   Placement date:   Placement time:   Site:   Days:   Peripheral IV   -    -    -      Peripheral IV 11/29/18 Left;Lateral;Distal Forearm   11/29/18    0013    Forearm   less than 1          Labs/Imaging Results for orders placed or performed during the hospital encounter of 11/28/18 (from the past 48 hour(s))  Comprehensive metabolic panel     Status: Abnormal   Collection Time: 11/28/18 10:14 PM  Result Value Ref Range   Sodium 136 135 - 145 mmol/L   Potassium 2.8 (L) 3.5 - 5.1 mmol/L   Chloride 101 98 - 111 mmol/L   CO2 27 22 - 32 mmol/L   Glucose, Bld 155 (H) 70 - 99 mg/dL   BUN 21 8 - 23 mg/dL   Creatinine, Ser 1.26 (H) 0.44 - 1.00 mg/dL   Calcium 9.4 8.9 - 10.3 mg/dL   Total Protein 7.3 6.5 - 8.1 g/dL   Albumin  3.6 3.5 - 5.0 g/dL   AST 21 15 - 41 U/L   ALT 18 0 - 44 U/L   Alkaline Phosphatase 52 38 - 126 U/L   Total Bilirubin 0.8 0.3 - 1.2 mg/dL   GFR calc non Af Amer 38 (L) >60 mL/min   GFR calc Af Amer 44 (L) >60 mL/min   Anion gap 8 5 - 15    Comment: Performed at [facility], [place] [address], [place], [place] [address]  CBC with Differential     Status: Abnormal   Collection Time: 11/28/18 10:14 PM  Result Value Ref Range   WBC 7.9 4.0 - 10.5 K/uL   RBC 4.53 3.87 - 5.11 MIL/uL   Hemoglobin 11.5 (L) 12.0 - 15.0 g/dL   HCT 36.6 36.0 - 46.0 %   MCV 80.8 80.0 - 100.0 fL   MCH 25.4 (L) 26.0 - 34.0 pg   MCHC 31.4 30.0 - 36.0 g/dL   RDW 17.0 (H) 11.5 - 15.5 %   Platelets 109 (L) 150 - 400 K/uL    Comment: REPEATED TO VERIFY PLATELET COUNT CONFIRMED BY SMEAR SPECIMEN CHECKED FOR CLOTS Immature Platelet Fraction may be clinically indicated, consider ordering this additional  test [id number]    nRBC 0.0 0.0 - 0.2 %   Neutrophils Relative % 72 %   Neutro Abs 5.6 1.7 - 7.7 K/uL   Lymphocytes Relative 15 %   Lymphs Abs 1.2 0.7 - 4.0 K/uL   Monocytes Relative 12 %   Monocytes Absolute 1.0 0.1 - 1.0 K/uL   Eosinophils Relative 1 %   Eosinophils Absolute 0.1 0.0 - 0.5 K/uL   Basophils Relative 0 %   Basophils Absolute 0.0 0.0 - 0.1 K/uL   Immature Granulocytes 0 %   Abs Immature Granulocytes 0.03 0.00 - 0.07 K/uL    Comment: Performed at [facility], [place] [address] [place], [place] [address]  Protime-INR     Status: None   Collection Time: 11/28/18 10:14 PM  Result Value Ref Range   Prothrombin Time 13.3 11.4 - 15.2 seconds   INR 1.02     Comment: Performed at [facility], [place] [address], [place], [place] [address]  Brain natriuretic peptide     Status: Abnormal   Collection Time: 11/28/18 10:15 PM  Result Value Ref Range   B Natriuretic Peptide 148.4 (H) 0.0 - 100.0 pg/mL    Comment: Performed at [facility], [place] [address], [place], [place] [address]  I-stat troponin, ED     Status: None   Collection Time: 11/28/18 11:21 PM  Result Value Ref Range   Troponin i, poc 0.02 0.00 - 0.08 ng/mL   Comment 3            Comment: Due to the release kinetics of cTnI, a negative result within the first hours of the onset of symptoms does not rule out myocardial infarction with certainty. If myocardial infarction is still suspected, repeat the test at appropriate intervals.    Dg Chest 2 View  Result Date: 11/28/2018 CLINICAL DATA:  Bilateral leg swelling and pain. EXAM: CHEST - 2 VIEW COMPARISON:  12/03/2014. FINDINGS: Stable enlarged cardiac silhouette and tortuous and calcified thoracic aorta. Clear lungs with normal vascularity. Mild thoracic spine degenerative changes. IMPRESSION: No acute abnormality.  Stable cardiomegaly. Electronically Signed   By: Claudie Revering M.D.   On: 11/28/2018 22:09   EKG Interpretation  Date/Time:  Thursday November 28 2018 23:03:08 EST Ventricular Rate:  74 PR Interval:    QRS Duration: 184 QT Interval:  471 QTC Calculation: 523 R Axis:   97 Text Interpretation:  Sinus rhythm Probable left atrial enlargement Left bundle branch block agree. no sig change from old Confirmed by Charlesetta Shanks [phone]) on 11/28/2018 11:19:52 PM   Pending Labs Unresulted Labs (From admission, onward)    Start     Ordered   11/28/18 2149  Urinalysis, Routine w reflex microscopic  ONCE - STAT,   STAT     11/28/18 2149   Signed and Held  Comprehensive metabolic panel  Tomorrow morning,   R     Signed and Held   Signed and Held  CBC  Tomorrow morning,   R     Signed and Held          Vitals/Pain Today's Vitals   11/28/18 2130 11/28/18 2131  BP: (!) 163/53   Pulse: 80   Resp: 18   Temp: 98.4 F (36.9 C)   TempSrc: Oral   SpO2: 95%   Weight: 86.2 kg   Height: 5\' 5"  (1.651 m)   PainSc:  6     Isolation Precautions No active  isolations  Medications  Medications  potassium chloride 10 mEq in 100 mL IVPB (has no administration in time range)  potassium chloride SA (K-DUR,KLOR-CON) CR tablet 40 mEq (has no administration in time range)  enoxaparin (LOVENOX) injection 85 mg (has no administration in time range)    Mobility walks with person assist

## 2018-11-30 DIAGNOSIS — I10 Essential (primary) hypertension: Secondary | ICD-10-CM

## 2018-11-30 DIAGNOSIS — E1140 Type 2 diabetes mellitus with diabetic neuropathy, unspecified: Secondary | ICD-10-CM | POA: Diagnosis not present

## 2018-11-30 DIAGNOSIS — L03116 Cellulitis of left lower limb: Secondary | ICD-10-CM | POA: Diagnosis not present

## 2018-11-30 DIAGNOSIS — I5032 Chronic diastolic (congestive) heart failure: Secondary | ICD-10-CM | POA: Diagnosis not present

## 2018-11-30 DIAGNOSIS — N184 Chronic kidney disease, stage 4 (severe): Secondary | ICD-10-CM

## 2018-11-30 DIAGNOSIS — D696 Thrombocytopenia, unspecified: Secondary | ICD-10-CM

## 2018-11-30 DIAGNOSIS — R2242 Localized swelling, mass and lump, left lower limb: Secondary | ICD-10-CM | POA: Diagnosis not present

## 2018-11-30 LAB — GLUCOSE, CAPILLARY
Glucose-Capillary: 125 mg/dL — ABNORMAL HIGH (ref 70–99)
Glucose-Capillary: 183 mg/dL — ABNORMAL HIGH (ref 70–99)

## 2018-11-30 MED ORDER — CEPHALEXIN 500 MG PO CAPS: 500.0000 mg | ORAL_CAPSULE | Freq: Two times a day (BID) | ORAL | 0 refills | Status: AC

## 2018-11-30 MED ORDER — GUAIFENESIN-DM 100-10 MG/5ML PO SYRP
5.0000 mL | ORAL_SOLUTION | ORAL | Status: DC | PRN
  Filled 2018-11-30: qty 10

## 2018-11-30 NOTE — Discharge Summary (Signed)
Physician Discharge Summary  Rachel Copeland:[id number] DOB: 11-01-1929 DOA: 11/28/2018  PCP: Lucianne Lei, MD  Admit date: 11/28/2018 Discharge date: 11/30/2018  Admitted From: Home  Disposition:  Home   Recommendations for Outpatient Follow-up:  1. Follow up with PCP in 1 week 2. Please obtain BMP/CBC in one week 3. Please assist patient with compression stockings, assess for resolved redness   Home Health: None  Equipment/Devices: None  Discharge Condition: Fair  CODE STATUS: FULL Diet recommendation: Diabetic, cardiac  Brief/Interim Summary: Rachel Copeland is an 84 y.o. F with HTN, DM, chronic venous stasis edema, and dCHF who presented with left leg redness, swelling, warmth and pain.  Admitted for [place] of leg and IV antibiotics for suspected cellulitis.        PRINCIPAL HOSPITAL DIAGNOSIS: Left leg cellulitis    Discharge Diagnoses:   Cellulitis of left lower extremity There is unilateral, well circumscribed skin redness and warmth on the left leg.  BNP normal, no other signs of CHF, and no recent weights available, doubt CHF exacerbation. A doppler US of the bilateral legs was negative for DVT.    While this may be simply venous stasis and inflammation from swelling, infection could not be ruled out.  She was therefore treated with IV cefazolin.  She had no leukocytosis or fever and was tolerating PO intake well, and so was discharged to elevated the leg, finish 7 days cefazolin and follow up with PCP for compression stockings.     Hypertension Well controlled  Diabetes Hgb A1c 7.9%, excellent control for 84 yo.  Chronic Kidney disease stage III Baseline creatinine 1.1.  Stable.  Chronic thrombocytopenia  Obesity BMI 31  Chronic diastolic CHF BNP normal.         Discharge Instructions  Discharge Instructions    Diet - low sodium heart healthy   Complete by:  As directed    Discharge instructions   Complete by:  As directed     From Dr. Loleta Books: You were admitted with leg swelling and cellulitis. You were treated with IV antibiotics. You should finish the course of antibiotics by mouth with cephalexin 500 mg twice daily for 1 week Call your primary care doctor this week for a follow up appointment in the next 10 days.   Keep your leg elevated Take acetaminophen or Tylenol (1000 mg, two extra strength tabs) up to three times per day Use a hot compress or cold cloth for comfort as well. When you start to have less pain in that leg, start to wear the compression stockings we talked about.   Increase activity slowly   Complete by:  As directed      Allergies as of 11/30/2018      Reactions   Sitagliptin Phosphate    REACTION: Finger and hand tingling RUQ pain      Medication List    TAKE these medications   amLODipine 10 MG tablet Commonly known as:  NORVASC Take 10 mg by mouth daily.   aspirin 81 MG tablet Take 81 mg by mouth daily.   calcium-vitamin D 500-200 MG-UNIT tablet Commonly known as:  OSCAL WITH D Take 1 tablet by mouth every morning.   carvedilol 25 MG tablet Commonly known as:  COREG Take 25 mg by mouth 2 (two) times daily with a meal.   cephALEXin 500 MG capsule Commonly known as:  KEFLEX Take 1 capsule (500 mg total) by mouth 2 (two) times daily for 7 days.   fish oil-omega-3 fatty  acids 1000 MG capsule Take 1 g by mouth every morning.   furosemide 40 MG tablet Commonly known as:  LASIX Take 1 tablet (40 mg total) by mouth every morning.   MULTIVITAMIN ADULT PO Take by mouth.   potassium chloride 10 MEQ tablet Commonly known as:  K-DUR Take 1 tablet (10 mEq total) by mouth daily.   valsartan-hydrochlorothiazide 320-25 MG tablet Commonly known as:  DIOVAN-HCT Take 1 tablet by mouth daily.   vitamin C 1000 MG tablet Take 1,000 mg by mouth every morning.       Allergies  Allergen Reactions  . Sitagliptin Phosphate     REACTION: Finger and hand tingling RUQ pain     Consultations:  None   Procedures/Studies: Dg Chest 2 View  Result Date: 11/28/2018 CLINICAL DATA:  Bilateral leg swelling and pain. EXAM: CHEST - 2 VIEW COMPARISON:  12/03/2014. FINDINGS: Stable enlarged cardiac silhouette and tortuous and calcified thoracic aorta. Clear lungs with normal vascularity. Mild thoracic spine degenerative changes. IMPRESSION: No acute abnormality.  Stable cardiomegaly. Electronically Signed   By: Claudie Revering M.D.   On: 11/28/2018 22:09   Mm 3d Screen Breast Bilateral  Result Date: 11/11/2018 CLINICAL DATA:  Screening. EXAM: DIGITAL SCREENING BILATERAL MAMMOGRAM WITH TOMO AND CAD COMPARISON:  Previous exam(s). ACR Breast Density Category b: There are scattered areas of fibroglandular density. FINDINGS: There are no findings suspicious for malignancy. Images were processed with CAD. IMPRESSION: No mammographic evidence of malignancy. A result letter of this screening mammogram will be mailed directly to the patient. RECOMMENDATION: Screening mammogram in one year. (Code:SM-B-01Y) BI-RADS CATEGORY  1: Negative. Electronically Signed   By: Ammie Ferrier M.D.   On: 11/11/2018 13:09   Vas [place] Lower Extremity Venous (dvt)  Result Date: 11/29/2018  Lower Venous Study Indications: Swelling.  Performing Technologist: Landry Mellow RDMS, RVT  Examination Guidelines: A complete evaluation includes B-mode imaging, spectral Doppler, color Doppler, and power Doppler as needed of all accessible portions of each vessel. Bilateral testing is considered an integral part of a complete examination. Limited examinations for reoccurring indications may be performed as noted.  Right Venous Findings: +---------+---------------+---------+-----------+----------+-------+          CompressibilityPhasicitySpontaneityPropertiesSummary +---------+---------------+---------+-----------+----------+-------+ CFV      Full           Yes      Yes                           +---------+---------------+---------+-----------+----------+-------+ SFJ      Full                                                 +---------+---------------+---------+-----------+----------+-------+ FV Prox  Full                                                 +---------+---------------+---------+-----------+----------+-------+ FV Mid   Full                                                 +---------+---------------+---------+-----------+----------+-------+ FV DistalFull                                                 +---------+---------------+---------+-----------+----------+-------+  PFV      Full                                                 +---------+---------------+---------+-----------+----------+-------+ POP      Full           Yes      Yes                          +---------+---------------+---------+-----------+----------+-------+ PTV      Full                                                 +---------+---------------+---------+-----------+----------+-------+ PERO     Full                                                 +---------+---------------+---------+-----------+----------+-------+ PTV, pero not well visualized  Left Venous Findings: +---------+---------------+---------+-----------+----------+-------+          CompressibilityPhasicitySpontaneityPropertiesSummary +---------+---------------+---------+-----------+----------+-------+ CFV      Full           Yes      Yes                          +---------+---------------+---------+-----------+----------+-------+ SFJ      Full                                                 +---------+---------------+---------+-----------+----------+-------+ FV Prox  Full                                                 +---------+---------------+---------+-----------+----------+-------+ FV Mid   Full                                                  +---------+---------------+---------+-----------+----------+-------+ FV DistalFull                                                 +---------+---------------+---------+-----------+----------+-------+ PFV      Full                                                 +---------+---------------+---------+-----------+----------+-------+ POP      Full           Yes      Yes                          +---------+---------------+---------+-----------+----------+-------+  PTV      Full                                                 +---------+---------------+---------+-----------+----------+-------+ PERO     Full                                                 +---------+---------------+---------+-----------+----------+-------+ PTV, pero not well visualized    Summary: Right: There is no evidence of deep vein thrombosis in the lower extremity. However, portions of this examination were limited- see technologist comments above. No cystic structure found in the popliteal fossa. Left: There is no evidence of deep vein thrombosis in the lower extremity. However, portions of this examination were limited- see technologist comments above. No cystic structure found in the popliteal fossa.  *See table(s) above for measurements and observations.    Preliminary        Subjective: Leg pain improved.  No fever overnight.  Leg swelling mild, mostly on the left at the inner calf, consistent with venous stasis.  No vomiting.  Discharge Exam: Vitals:   11/29/18 2000 11/30/18 0500  BP: (!) 136/51 (!) 155/54  Pulse: 67 67  Resp: 20 20  Temp: 98.2 F (36.8 C) 97.8 F (36.6 C)  SpO2: 100% 100%   Vitals:   11/29/18 0526 11/29/18 1347 11/29/18 2000 11/30/18 0500  BP: (!) 144/55 (!) 142/52 (!) 136/51 (!) 155/54  Pulse: 69 64 67 67  Resp: 18 18 20 20   Temp: 98.1 F (36.7 C) 98.2 F (36.8 C) 98.2 F (36.8 C) 97.8 F (36.6 C)  TempSrc: Oral Oral Oral Oral  SpO2: 95% 95% 100% 100%  Weight:       Height:        General: Pt is alert, awake, not in acute distress Cardiovascular: RRR, nl S1-S2, no murmurs appreciated.   No LE edema.   Respiratory: Normal respiratory rate and rhythm.  CTAB without rales or wheezes. Abdominal: Abdomen soft and non-tender.  No distension or HSM.   MSK: The left inner leg is red and tender and indurated and swollen.  She has varicose veins but no clear thrombophlebitis.   Neuro/Psych: Strength symmetric in upper and lower extremities.  Judgment and insight appear normal.   The results of significant diagnostics from this hospitalization (including imaging, microbiology, ancillary and laboratory) are listed below for reference.     Microbiology: No results found for this or any previous visit (from the past 240 hour(s)).   Labs: BNP (last 3 results) Recent Labs    11/28/18 2215  BNP 619.5*   Basic Metabolic Panel: Recent Labs  Lab 11/28/18 2214 11/29/18 0858  NA 136 138  K 2.8* 3.3*  CL 101 104  CO2 27 25  GLUCOSE 155* 162*  BUN 21 20  CREATININE 1.26* 1.10*  CALCIUM 9.4 9.1   Liver Function Tests: Recent Labs  Lab 11/28/18 2214 11/29/18 0858  AST 21 18  ALT 18 15  ALKPHOS 52 53  BILITOT 0.8 0.8  PROT 7.3 7.0  ALBUMIN 3.6 3.5   No results for input(s): LIPASE, AMYLASE in the last 168 hours. No results for input(s): AMMONIA in the last 168 hours. CBC: Recent Labs  Lab 11/28/18 2214 11/29/18 0858  WBC 7.9 7.5  NEUTROABS 5.6  --   HGB 11.5* 11.2*  HCT 36.6 35.5*  MCV 80.8 81.8  PLT 109* 105*   Cardiac Enzymes: No results for input(s): CKTOTAL, CKMB, CKMBINDEX, TROPONINI in the last 168 hours. BNP: Invalid input(s): POCBNP CBG: Recent Labs  Lab 11/29/18 1114 11/29/18 1651 11/29/18 2038 11/30/18 0715 11/30/18 1131  GLUCAP 175* 124* 160* 125* 183*   D-Dimer No results for input(s): DDIMER in the last 72 hours. Hgb A1c No results for input(s): HGBA1C in the last 72 hours. Lipid Profile No results for  input(s): CHOL, HDL, LDLCALC, TRIG, CHOLHDL, LDLDIRECT in the last 72 hours. Thyroid function studies No results for input(s): TSH, T4TOTAL, T3FREE, THYROIDAB in the last 72 hours.  Invalid input(s): FREET3 Anemia work up No results for input(s): VITAMINB12, FOLATE, FERRITIN, TIBC, IRON, RETICCTPCT in the last 72 hours. Urinalysis    Component Value Date/Time   COLORURINE YELLOW 11/29/2018 [place] 11/29/2018 0414   LABSPEC 1.008 11/29/2018 0414   PHURINE 5.0 11/29/2018 0414   GLUCOSEU NEGATIVE 11/29/2018 0414   HGBUR SMALL (A) 11/29/2018 0414   HGBUR negative 04/22/2008 1009   BILIRUBINUR NEGATIVE 11/29/2018 0414   KETONESUR NEGATIVE 11/29/2018 0414   PROTEINUR NEGATIVE 11/29/2018 0414   UROBILINOGEN 0.2 12/01/2014 2103   NITRITE NEGATIVE 11/29/2018 0414   LEUKOCYTESUR TRACE (A) 11/29/2018 0414   Sepsis Labs Invalid input(s): PROCALCITONIN,  WBC,  LACTICIDVEN Microbiology No results found for this or any previous visit (from the past 240 hour(s)).   Time coordinating discharge: 30 minutes       SIGNED:   Edwin Dada, MD  [facility] 11/30/2018, 7:08 PM

## 2018-12-06 DIAGNOSIS — E876 Hypokalemia: Secondary | ICD-10-CM | POA: Diagnosis not present

## 2018-12-06 DIAGNOSIS — E119 Type 2 diabetes mellitus without complications: Secondary | ICD-10-CM | POA: Diagnosis not present

## 2018-12-06 DIAGNOSIS — I10 Essential (primary) hypertension: Secondary | ICD-10-CM | POA: Diagnosis not present

## 2018-12-06 DIAGNOSIS — L03818 Cellulitis of other sites: Secondary | ICD-10-CM | POA: Diagnosis not present

## 2018-12-26 DIAGNOSIS — E876 Hypokalemia: Secondary | ICD-10-CM | POA: Diagnosis not present

## 2018-12-26 DIAGNOSIS — E119 Type 2 diabetes mellitus without complications: Secondary | ICD-10-CM | POA: Diagnosis not present

## 2018-12-26 DIAGNOSIS — E089 Diabetes mellitus due to underlying condition without complications: Secondary | ICD-10-CM | POA: Diagnosis not present

## 2018-12-26 DIAGNOSIS — E782 Mixed hyperlipidemia: Secondary | ICD-10-CM | POA: Diagnosis not present

## 2018-12-26 DIAGNOSIS — I10 Essential (primary) hypertension: Secondary | ICD-10-CM | POA: Diagnosis not present

## 2018-12-26 DIAGNOSIS — N189 Chronic kidney disease, unspecified: Secondary | ICD-10-CM | POA: Diagnosis not present

## 2019-01-16 DIAGNOSIS — I10 Essential (primary) hypertension: Secondary | ICD-10-CM | POA: Diagnosis not present

## 2019-01-16 DIAGNOSIS — Z0000 Encounter for general adult medical examination without abnormal findings: Secondary | ICD-10-CM | POA: Diagnosis not present

## 2019-02-06 DIAGNOSIS — L0390 Cellulitis, unspecified: Secondary | ICD-10-CM | POA: Diagnosis not present

## 2019-03-28 ENCOUNTER — Other Ambulatory Visit: Payer: Self-pay

## 2019-03-28 DIAGNOSIS — L03116 Cellulitis of left lower limb: Secondary | ICD-10-CM

## 2019-04-07 ENCOUNTER — Encounter: Payer: Medicare Other | Admitting: Surgery

## 2019-04-07 ENCOUNTER — Ambulatory Visit (HOSPITAL_COMMUNITY)
Admission: RE | Admit: 2019-04-07 | Discharge: 2019-04-07 | Disposition: A | Discharge status: Home / Self Care | Payer: Medicare Other | Source: Ambulatory Visit | Attending: Surgery | Admitting: Surgery

## 2019-04-07 ENCOUNTER — Ambulatory Visit (INDEPENDENT_AMBULATORY_CARE_PROVIDER_SITE_OTHER): Payer: Medicare Other | Admitting: Surgery

## 2019-04-07 ENCOUNTER — Other Ambulatory Visit: Payer: Self-pay

## 2019-04-07 ENCOUNTER — Encounter (HOSPITAL_COMMUNITY): Payer: Medicare Other

## 2019-04-07 ENCOUNTER — Encounter: Payer: Self-pay | Admitting: Surgery

## 2019-04-07 VITALS — BP 153/78 | HR 66 | Temp 97.2°F | Resp 20 | Ht 65.0 in | Wt 193.5 lb

## 2019-04-07 DIAGNOSIS — L03116 Cellulitis of left lower limb: Secondary | ICD-10-CM | POA: Diagnosis not present

## 2019-04-07 NOTE — Progress Notes (Signed)
[facility]  Patient name: Rachel Copeland MRN: [id number] DOB: 30-Jan-1929 Sex: female   REQUESTING PROVIDER:    Dr. Criss Rosales   REASON FOR CONSULT:    Left leg cellulitis  HISTORY OF PRESENT ILLNESS:   Rachel Copeland is a 85 y.o. female, who is referred today for evaluation of chronic swelling and cellulitis particularly in her left leg.  She was admitted to the hospital in December 2019 with left leg swelling and pain.  She ruled out for DVT and was treated with antibiotics for suspected cellulitis.  She has been wearing some form of compression stockings, and she tries to keep her legs elevated when possible.  She does not have any open wounds at this time.  Patient has a history of hypertension which is medically managed.  She takes a statin for hypercholesterolemia.  She is a diabetic.  She has a history of congestive heart failure.  PAST MEDICAL HISTORY    Past Medical History:  Diagnosis Date  . CHF (congestive heart failure) ([place])   . Diabetes mellitus    no medications  . Hyperlipidemia   . Hypertension      FAMILY HISTORY   Family History  Problem Relation Age of Onset  . Colon cancer Mother   . Colon polyps Mother   . Diabetes Mellitus II Brother   . Diabetes Mellitus II Daughter   . Breast cancer Neg Hx   . Stomach cancer Neg Hx     SOCIAL HISTORY:   Social History   Socioeconomic History  . Marital status: Widowed    Spouse name: Not on file  . Number of children: 8  . Years of education: Not on file  . Highest education level: Not on file  Occupational History  . Not on file  Social Needs  . Financial resource strain: Not on file  . Food insecurity:    Worry: Not on file    Inability: Not on file  . Transportation needs:    Medical: Not on file    Non-medical: Not on file  Tobacco Use  . Smoking status: Never Smoker  . Smokeless tobacco: Never Used  Substance and  Sexual Activity  . Alcohol use: No  . Drug use: No  . Sexual activity: Not Currently    Partners: Male  Lifestyle  . Physical activity:    Days per week: Not on file    Minutes per session: Not on file  . Stress: Not on file  Relationships  . Social connections:    Talks on phone: Not on file    Gets together: Not on file    Attends religious service: Not on file    Active member of club or organization: Not on file    Attends meetings of clubs or organizations: Not on file    Relationship status: Not on file  . Intimate partner violence:    Fear of current or ex partner: Not on file    Emotionally abused: Not on file    Physically abused: Not on file    Forced sexual activity: Not on file  Other Topics Concern  . Not on file  Social History Narrative  . Not on file    ALLERGIES:    Allergies  Allergen Reactions  . Sitagliptin Phosphate     REACTION: Finger and hand tingling RUQ pain    CURRENT MEDICATIONS:    Current Outpatient Medications  Medication Sig Dispense Refill  .  amLODipine (NORVASC) 10 MG tablet Take 10 mg by mouth daily.    . Ascorbic Acid (VITAMIN C) 1000 MG tablet Take 1,000 mg by mouth every morning.    Marland Kitchen aspirin 81 MG tablet Take 81 mg by mouth daily.    . calcium-vitamin D (OSCAL WITH D) 500-200 MG-UNIT per tablet Take 1 tablet by mouth every morning.    . carvedilol (COREG) 25 MG tablet Take 25 mg by mouth 2 (two) times daily with a meal.    . fish oil-omega-3 fatty acids 1000 MG capsule Take 1 g by mouth every morning.    . furosemide (LASIX) 40 MG tablet Take 1 tablet (40 mg total) by mouth every morning. 30 tablet 0  . Multiple Vitamins-Minerals (MULTIVITAMIN ADULT PO) Take by mouth.    . rosuvastatin (CRESTOR) 10 MG tablet TK 1 T PO QD    . sitaGLIPtin (JANUVIA) 100 MG tablet Take 100 mg by mouth daily.    . valsartan-hydrochlorothiazide (DIOVAN-HCT) 320-25 MG per tablet Take 1 tablet by mouth daily.    . potassium chloride (K-DUR) 10 MEQ  tablet Take 1 tablet (10 mEq total) by mouth daily. (Patient not taking: Reported on 04/07/2019) 30 tablet 0   No current facility-administered medications for this visit.     REVIEW OF SYSTEMS:   [X]  denotes positive finding, [ ]  denotes negative finding Cardiac  Comments:  Chest pain or chest pressure:    Shortness of breath upon exertion:    Short of breath when lying flat:    Irregular heart rhythm:        Vascular    Pain in calf, thigh, or hip brought on by ambulation:    Pain in feet at night that wakes you up from your sleep:     Blood clot in your veins:    Leg swelling:  x       Pulmonary    Oxygen at home:    Productive cough:     Wheezing:         Neurologic    Sudden weakness in arms or legs:     Sudden numbness in arms or legs:     Sudden onset of difficulty speaking or slurred speech:    Temporary loss of vision in one eye:     Problems with dizziness:         Gastrointestinal    Blood in stool:      Vomited blood:         Genitourinary    Burning when urinating:     Blood in urine:        Psychiatric    Major depression:         Hematologic    Bleeding problems:    Problems with blood clotting too easily:        Skin    Rashes or ulcers:        Constitutional    Fever or chills:     PHYSICAL EXAM:   Vitals:   04/07/19 1114  BP: (!) 153/78  Pulse: 66  Resp: 20  Temp: (!) 97.2 F (36.2 C)  SpO2: 97%  Weight: 87.8 kg  Height: 5\' 5"  (1.651 m)    GENERAL: The patient is a well-nourished female, in no acute distress. The vital signs are documented above. CARDIAC: There is a regular rate and rhythm.  VASCULAR: Palpable dorsalis pedis pulse bilaterally.  Bilateral pitting edema left greater than right PULMONARY: Nonlabored respirations MUSCULOSKELETAL: There are no  major deformities or cyanosis. NEUROLOGIC: No focal weakness or paresthesias are detected. SKIN: There are no ulcers or rashes noted. PSYCHIATRIC: The patient has a normal  affect.  STUDIES:   I have ordered and reviewed her vascular studies with the following findings: Venous Reflux Times Normal value < 0.5 sec +------------------------------+----------+---------+                               Right (ms)Left (ms) +------------------------------+----------+---------+ FV                                      961.00    +------------------------------+----------+---------+ Popliteal                               2354.00   +------------------------------+----------+---------+ GSV at Saphenofemoral junction          565.00    +------------------------------+----------+---------+  Vein Diameters: +------------------------------+----------+---------+                               Right (cm)Left (cm) +------------------------------+----------+---------+ GSV at Saphenofemoral junction          0.47      +------------------------------+----------+---------+ GSV at prox thigh                       0.36      +------------------------------+----------+---------+ GSV at mid thigh                        0.36      +------------------------------+----------+---------+ GSV at distal thigh                     0.39      +------------------------------+----------+---------+ GSV at knee                             0.38      +------------------------------+----------+---------+ GSV prox calf                           0.31      +------------------------------+----------+---------+  Summary: Left: Abnormal reflux times were noted in the common femoral vein, femoral vein in the thigh, popliteal vein, and great saphenous vein at the saphenofemoral junction.  DVT Study:  Right: There is no evidence of deep vein thrombosis in the lower extremity. However, portions of this examination were limited- see technologist comments above. No cystic structure found in the popliteal fossa. Left: There is no evidence of deep vein thrombosis in  the lower extremity. However, portions of this examination were limited- see technologist comments above. No cystic structure found in the popliteal fossa.   ASSESSMENT and PLAN   Left leg edema: This appears to be secondary to above venous insufficiency as well as lymphedema.  She does not have any open wounds at this time or any active infection.  Her venous reflux study reveals that this is mostly a deep vein problem.  For that reason, this is best managed with leg elevation and compression stockings.  I discussed with the patient that I do not feel that she is wearing adequate compression and I have given her information about  how to obtain the appropriate level of compression.  All of her questions were answered today.  She will follow-up on an as-needed basis.   Leia Alf, MD, FACS [facility] [phone] Pager [phone]

## 2019-09-10 DIAGNOSIS — R0602 Shortness of breath: Secondary | ICD-10-CM | POA: Diagnosis not present

## 2019-09-10 DIAGNOSIS — E1169 Type 2 diabetes mellitus with other specified complication: Secondary | ICD-10-CM | POA: Diagnosis not present

## 2019-09-10 DIAGNOSIS — N189 Chronic kidney disease, unspecified: Secondary | ICD-10-CM | POA: Diagnosis not present

## 2019-09-10 DIAGNOSIS — I10 Essential (primary) hypertension: Secondary | ICD-10-CM | POA: Diagnosis not present

## 2019-09-30 ENCOUNTER — Other Ambulatory Visit: Payer: Self-pay

## 2019-09-30 NOTE — Patient Outreach (Signed)
[facility]) Care Management  09/30/2019  KAILEEN BUNTAIN 07/07/1929 [place]:[id number]   Medication Adherence call to Mrs. Nadyne Coombes HIPPA Compliant Voice message left with a call back number. Patient is showing past due on Valsartan/Hctz 320/25 mg under [place].   [facility] Management Direct Dial [phone]  Fax [phone] [email or web address]

## 2019-10-20 ENCOUNTER — Other Ambulatory Visit: Payer: Self-pay | Admitting: Family Medicine

## 2019-10-20 DIAGNOSIS — Z1231 Encounter for screening mammogram for malignant neoplasm of breast: Secondary | ICD-10-CM

## 2019-11-04 DIAGNOSIS — Z961 Presence of intraocular lens: Secondary | ICD-10-CM | POA: Diagnosis not present

## 2019-11-04 DIAGNOSIS — E119 Type 2 diabetes mellitus without complications: Secondary | ICD-10-CM | POA: Diagnosis not present

## 2019-11-10 DIAGNOSIS — E1169 Type 2 diabetes mellitus with other specified complication: Secondary | ICD-10-CM | POA: Diagnosis not present

## 2019-11-10 DIAGNOSIS — G3184 Mild cognitive impairment, so stated: Secondary | ICD-10-CM | POA: Diagnosis not present

## 2019-11-10 DIAGNOSIS — I10 Essential (primary) hypertension: Secondary | ICD-10-CM | POA: Diagnosis not present

## 2019-11-10 DIAGNOSIS — E782 Mixed hyperlipidemia: Secondary | ICD-10-CM | POA: Diagnosis not present

## 2019-12-10 ENCOUNTER — Ambulatory Visit: Payer: Medicare Other

## 2019-12-22 ENCOUNTER — Ambulatory Visit
Admission: RE | Admit: 2019-12-22 | Discharge: 2019-12-22 | Disposition: A | Discharge status: Home / Self Care | Payer: Medicare Other | Source: Ambulatory Visit | Attending: Family Medicine | Admitting: Family Medicine

## 2019-12-22 ENCOUNTER — Other Ambulatory Visit: Payer: Self-pay

## 2019-12-22 DIAGNOSIS — Z1231 Encounter for screening mammogram for malignant neoplasm of breast: Secondary | ICD-10-CM | POA: Diagnosis not present

## 2020-02-10 DIAGNOSIS — M130 Polyarthritis, unspecified: Secondary | ICD-10-CM | POA: Diagnosis not present

## 2020-02-10 DIAGNOSIS — E1169 Type 2 diabetes mellitus with other specified complication: Secondary | ICD-10-CM | POA: Diagnosis not present

## 2020-02-10 DIAGNOSIS — I10 Essential (primary) hypertension: Secondary | ICD-10-CM | POA: Diagnosis not present

## 2020-02-22 DIAGNOSIS — M130 Polyarthritis, unspecified: Secondary | ICD-10-CM | POA: Diagnosis not present

## 2020-02-22 DIAGNOSIS — N189 Chronic kidney disease, unspecified: Secondary | ICD-10-CM | POA: Diagnosis not present

## 2020-02-22 DIAGNOSIS — E7849 Other hyperlipidemia: Secondary | ICD-10-CM | POA: Diagnosis not present

## 2020-02-22 DIAGNOSIS — I129 Hypertensive chronic kidney disease with stage 1 through stage 4 chronic kidney disease, or unspecified chronic kidney disease: Secondary | ICD-10-CM | POA: Diagnosis not present

## 2020-03-10 DIAGNOSIS — E785 Hyperlipidemia, unspecified: Secondary | ICD-10-CM | POA: Diagnosis not present

## 2020-03-10 DIAGNOSIS — E782 Mixed hyperlipidemia: Secondary | ICD-10-CM | POA: Diagnosis not present

## 2020-03-10 DIAGNOSIS — E1169 Type 2 diabetes mellitus with other specified complication: Secondary | ICD-10-CM | POA: Diagnosis not present

## 2020-03-10 DIAGNOSIS — I10 Essential (primary) hypertension: Secondary | ICD-10-CM | POA: Diagnosis not present

## 2020-03-24 DIAGNOSIS — N189 Chronic kidney disease, unspecified: Secondary | ICD-10-CM | POA: Diagnosis not present

## 2020-03-24 DIAGNOSIS — E7849 Other hyperlipidemia: Secondary | ICD-10-CM | POA: Diagnosis not present

## 2020-03-24 DIAGNOSIS — I129 Hypertensive chronic kidney disease with stage 1 through stage 4 chronic kidney disease, or unspecified chronic kidney disease: Secondary | ICD-10-CM | POA: Diagnosis not present

## 2020-03-24 DIAGNOSIS — M130 Polyarthritis, unspecified: Secondary | ICD-10-CM | POA: Diagnosis not present

## 2020-04-22 DIAGNOSIS — J301 Allergic rhinitis due to pollen: Secondary | ICD-10-CM | POA: Diagnosis not present

## 2020-04-22 DIAGNOSIS — E785 Hyperlipidemia, unspecified: Secondary | ICD-10-CM | POA: Diagnosis not present

## 2020-04-22 DIAGNOSIS — E1169 Type 2 diabetes mellitus with other specified complication: Secondary | ICD-10-CM | POA: Diagnosis not present

## 2020-04-22 DIAGNOSIS — I10 Essential (primary) hypertension: Secondary | ICD-10-CM | POA: Diagnosis not present

## 2020-04-23 DIAGNOSIS — M130 Polyarthritis, unspecified: Secondary | ICD-10-CM | POA: Diagnosis not present

## 2020-04-23 DIAGNOSIS — N189 Chronic kidney disease, unspecified: Secondary | ICD-10-CM | POA: Diagnosis not present

## 2020-04-23 DIAGNOSIS — E7849 Other hyperlipidemia: Secondary | ICD-10-CM | POA: Diagnosis not present

## 2020-04-23 DIAGNOSIS — I129 Hypertensive chronic kidney disease with stage 1 through stage 4 chronic kidney disease, or unspecified chronic kidney disease: Secondary | ICD-10-CM | POA: Diagnosis not present

## 2020-06-24 DIAGNOSIS — E785 Hyperlipidemia, unspecified: Secondary | ICD-10-CM | POA: Diagnosis not present

## 2020-06-24 DIAGNOSIS — K149 Disease of tongue, unspecified: Secondary | ICD-10-CM | POA: Diagnosis not present

## 2020-06-24 DIAGNOSIS — E1169 Type 2 diabetes mellitus with other specified complication: Secondary | ICD-10-CM | POA: Diagnosis not present

## 2020-06-24 DIAGNOSIS — I129 Hypertensive chronic kidney disease with stage 1 through stage 4 chronic kidney disease, or unspecified chronic kidney disease: Secondary | ICD-10-CM | POA: Diagnosis not present

## 2020-07-12 DIAGNOSIS — E785 Hyperlipidemia, unspecified: Secondary | ICD-10-CM | POA: Diagnosis not present

## 2020-07-12 DIAGNOSIS — E782 Mixed hyperlipidemia: Secondary | ICD-10-CM | POA: Diagnosis not present

## 2020-07-12 DIAGNOSIS — K149 Disease of tongue, unspecified: Secondary | ICD-10-CM | POA: Diagnosis not present

## 2020-07-12 DIAGNOSIS — I10 Essential (primary) hypertension: Secondary | ICD-10-CM | POA: Diagnosis not present

## 2020-07-12 DIAGNOSIS — G3184 Mild cognitive impairment, so stated: Secondary | ICD-10-CM | POA: Diagnosis not present

## 2020-07-12 DIAGNOSIS — G3189 Other specified degenerative diseases of nervous system: Secondary | ICD-10-CM | POA: Diagnosis not present

## 2020-08-02 DIAGNOSIS — Z0000 Encounter for general adult medical examination without abnormal findings: Secondary | ICD-10-CM | POA: Diagnosis not present

## 2020-08-02 DIAGNOSIS — I129 Hypertensive chronic kidney disease with stage 1 through stage 4 chronic kidney disease, or unspecified chronic kidney disease: Secondary | ICD-10-CM | POA: Diagnosis not present

## 2020-08-02 DIAGNOSIS — G3184 Mild cognitive impairment, so stated: Secondary | ICD-10-CM | POA: Diagnosis not present

## 2020-08-02 DIAGNOSIS — E785 Hyperlipidemia, unspecified: Secondary | ICD-10-CM | POA: Diagnosis not present

## 2020-08-24 DIAGNOSIS — I129 Hypertensive chronic kidney disease with stage 1 through stage 4 chronic kidney disease, or unspecified chronic kidney disease: Secondary | ICD-10-CM | POA: Diagnosis not present

## 2020-08-24 DIAGNOSIS — E7849 Other hyperlipidemia: Secondary | ICD-10-CM | POA: Diagnosis not present

## 2020-08-24 DIAGNOSIS — N182 Chronic kidney disease, stage 2 (mild): Secondary | ICD-10-CM | POA: Diagnosis not present

## 2020-08-24 DIAGNOSIS — M130 Polyarthritis, unspecified: Secondary | ICD-10-CM | POA: Diagnosis not present

## 2020-09-01 DIAGNOSIS — I110 Hypertensive heart disease with heart failure: Secondary | ICD-10-CM | POA: Diagnosis not present

## 2020-09-01 DIAGNOSIS — D696 Thrombocytopenia, unspecified: Secondary | ICD-10-CM | POA: Diagnosis not present

## 2020-09-01 DIAGNOSIS — I5032 Chronic diastolic (congestive) heart failure: Secondary | ICD-10-CM | POA: Diagnosis not present

## 2020-09-01 DIAGNOSIS — E785 Hyperlipidemia, unspecified: Secondary | ICD-10-CM | POA: Diagnosis not present

## 2020-09-01 DIAGNOSIS — E1169 Type 2 diabetes mellitus with other specified complication: Secondary | ICD-10-CM | POA: Diagnosis not present

## 2020-09-07 DIAGNOSIS — R448 Other symptoms and signs involving general sensations and perceptions: Secondary | ICD-10-CM | POA: Diagnosis not present

## 2020-09-07 DIAGNOSIS — I10 Essential (primary) hypertension: Secondary | ICD-10-CM | POA: Diagnosis not present

## 2020-09-07 DIAGNOSIS — H6121 Impacted cerumen, right ear: Secondary | ICD-10-CM | POA: Diagnosis not present

## 2020-09-16 DIAGNOSIS — E785 Hyperlipidemia, unspecified: Secondary | ICD-10-CM | POA: Diagnosis not present

## 2020-09-16 DIAGNOSIS — I129 Hypertensive chronic kidney disease with stage 1 through stage 4 chronic kidney disease, or unspecified chronic kidney disease: Secondary | ICD-10-CM | POA: Diagnosis not present

## 2020-10-18 DIAGNOSIS — I10 Essential (primary) hypertension: Secondary | ICD-10-CM | POA: Diagnosis not present

## 2020-10-18 DIAGNOSIS — K149 Disease of tongue, unspecified: Secondary | ICD-10-CM | POA: Diagnosis not present

## 2020-10-18 DIAGNOSIS — E1169 Type 2 diabetes mellitus with other specified complication: Secondary | ICD-10-CM | POA: Diagnosis not present

## 2020-10-18 DIAGNOSIS — E119 Type 2 diabetes mellitus without complications: Secondary | ICD-10-CM | POA: Diagnosis not present

## 2020-10-18 DIAGNOSIS — M130 Polyarthritis, unspecified: Secondary | ICD-10-CM | POA: Diagnosis not present

## 2020-10-23 DIAGNOSIS — E7849 Other hyperlipidemia: Secondary | ICD-10-CM | POA: Diagnosis not present

## 2020-10-23 DIAGNOSIS — I129 Hypertensive chronic kidney disease with stage 1 through stage 4 chronic kidney disease, or unspecified chronic kidney disease: Secondary | ICD-10-CM | POA: Diagnosis not present

## 2020-10-23 DIAGNOSIS — N182 Chronic kidney disease, stage 2 (mild): Secondary | ICD-10-CM | POA: Diagnosis not present

## 2020-10-23 DIAGNOSIS — M130 Polyarthritis, unspecified: Secondary | ICD-10-CM | POA: Diagnosis not present

## 2020-11-23 DIAGNOSIS — N182 Chronic kidney disease, stage 2 (mild): Secondary | ICD-10-CM | POA: Diagnosis not present

## 2020-11-23 DIAGNOSIS — E7849 Other hyperlipidemia: Secondary | ICD-10-CM | POA: Diagnosis not present

## 2020-11-23 DIAGNOSIS — I129 Hypertensive chronic kidney disease with stage 1 through stage 4 chronic kidney disease, or unspecified chronic kidney disease: Secondary | ICD-10-CM | POA: Diagnosis not present

## 2020-11-23 DIAGNOSIS — M130 Polyarthritis, unspecified: Secondary | ICD-10-CM | POA: Diagnosis not present

## 2020-11-24 ENCOUNTER — Other Ambulatory Visit: Payer: Self-pay | Admitting: Family Medicine

## 2020-11-24 DIAGNOSIS — Z1231 Encounter for screening mammogram for malignant neoplasm of breast: Secondary | ICD-10-CM

## 2020-12-24 DIAGNOSIS — E7849 Other hyperlipidemia: Secondary | ICD-10-CM | POA: Diagnosis not present

## 2020-12-24 DIAGNOSIS — N182 Chronic kidney disease, stage 2 (mild): Secondary | ICD-10-CM | POA: Diagnosis not present

## 2020-12-24 DIAGNOSIS — M130 Polyarthritis, unspecified: Secondary | ICD-10-CM | POA: Diagnosis not present

## 2020-12-24 DIAGNOSIS — I129 Hypertensive chronic kidney disease with stage 1 through stage 4 chronic kidney disease, or unspecified chronic kidney disease: Secondary | ICD-10-CM | POA: Diagnosis not present

## 2021-01-06 ENCOUNTER — Ambulatory Visit: Payer: Medicare Other

## 2021-01-12 ENCOUNTER — Ambulatory Visit: Payer: Medicare Other

## 2021-01-20 DIAGNOSIS — M130 Polyarthritis, unspecified: Secondary | ICD-10-CM | POA: Diagnosis not present

## 2021-01-20 DIAGNOSIS — E785 Hyperlipidemia, unspecified: Secondary | ICD-10-CM | POA: Diagnosis not present

## 2021-01-20 DIAGNOSIS — I10 Essential (primary) hypertension: Secondary | ICD-10-CM | POA: Diagnosis not present

## 2021-01-20 DIAGNOSIS — E1169 Type 2 diabetes mellitus with other specified complication: Secondary | ICD-10-CM | POA: Diagnosis not present

## 2021-01-20 DIAGNOSIS — M25561 Pain in right knee: Secondary | ICD-10-CM | POA: Diagnosis not present

## 2021-01-20 DIAGNOSIS — Z7189 Other specified counseling: Secondary | ICD-10-CM | POA: Diagnosis not present

## 2021-01-22 DIAGNOSIS — I129 Hypertensive chronic kidney disease with stage 1 through stage 4 chronic kidney disease, or unspecified chronic kidney disease: Secondary | ICD-10-CM | POA: Diagnosis not present

## 2021-01-22 DIAGNOSIS — N182 Chronic kidney disease, stage 2 (mild): Secondary | ICD-10-CM | POA: Diagnosis not present

## 2021-01-22 DIAGNOSIS — M130 Polyarthritis, unspecified: Secondary | ICD-10-CM | POA: Diagnosis not present

## 2021-01-22 DIAGNOSIS — E7849 Other hyperlipidemia: Secondary | ICD-10-CM | POA: Diagnosis not present

## 2021-02-03 DIAGNOSIS — G3184 Mild cognitive impairment, so stated: Secondary | ICD-10-CM | POA: Diagnosis not present

## 2021-02-03 DIAGNOSIS — I129 Hypertensive chronic kidney disease with stage 1 through stage 4 chronic kidney disease, or unspecified chronic kidney disease: Secondary | ICD-10-CM | POA: Diagnosis not present

## 2021-02-03 DIAGNOSIS — E782 Mixed hyperlipidemia: Secondary | ICD-10-CM | POA: Diagnosis not present

## 2021-02-03 DIAGNOSIS — E785 Hyperlipidemia, unspecified: Secondary | ICD-10-CM | POA: Diagnosis not present

## 2021-02-03 DIAGNOSIS — I10 Essential (primary) hypertension: Secondary | ICD-10-CM | POA: Diagnosis not present

## 2021-02-21 DIAGNOSIS — E7849 Other hyperlipidemia: Secondary | ICD-10-CM | POA: Diagnosis not present

## 2021-02-21 DIAGNOSIS — N182 Chronic kidney disease, stage 2 (mild): Secondary | ICD-10-CM | POA: Diagnosis not present

## 2021-02-21 DIAGNOSIS — I129 Hypertensive chronic kidney disease with stage 1 through stage 4 chronic kidney disease, or unspecified chronic kidney disease: Secondary | ICD-10-CM | POA: Diagnosis not present

## 2021-02-21 DIAGNOSIS — M130 Polyarthritis, unspecified: Secondary | ICD-10-CM | POA: Diagnosis not present

## 2021-03-17 ENCOUNTER — Inpatient Hospital Stay: Admission: RE | Admit: 2021-03-17 | Payer: Medicare Other | Source: Ambulatory Visit

## 2021-05-03 DIAGNOSIS — M130 Polyarthritis, unspecified: Secondary | ICD-10-CM | POA: Diagnosis not present

## 2021-05-03 DIAGNOSIS — Z20822 Contact with and (suspected) exposure to covid-19: Secondary | ICD-10-CM | POA: Diagnosis not present

## 2021-05-03 DIAGNOSIS — J301 Allergic rhinitis due to pollen: Secondary | ICD-10-CM | POA: Diagnosis not present

## 2021-05-03 DIAGNOSIS — E1169 Type 2 diabetes mellitus with other specified complication: Secondary | ICD-10-CM | POA: Diagnosis not present

## 2021-05-03 DIAGNOSIS — N182 Chronic kidney disease, stage 2 (mild): Secondary | ICD-10-CM | POA: Diagnosis not present

## 2021-05-03 DIAGNOSIS — Z0000 Encounter for general adult medical examination without abnormal findings: Secondary | ICD-10-CM | POA: Diagnosis not present

## 2021-05-03 DIAGNOSIS — E1122 Type 2 diabetes mellitus with diabetic chronic kidney disease: Secondary | ICD-10-CM | POA: Diagnosis not present

## 2021-05-03 DIAGNOSIS — I10 Essential (primary) hypertension: Secondary | ICD-10-CM | POA: Diagnosis not present

## 2021-05-03 DIAGNOSIS — I129 Hypertensive chronic kidney disease with stage 1 through stage 4 chronic kidney disease, or unspecified chronic kidney disease: Secondary | ICD-10-CM | POA: Diagnosis not present

## 2021-05-09 ENCOUNTER — Other Ambulatory Visit: Payer: Self-pay

## 2021-05-09 ENCOUNTER — Ambulatory Visit: Payer: Medicare Other

## 2021-05-09 ENCOUNTER — Ambulatory Visit
Admission: RE | Admit: 2021-05-09 | Discharge: 2021-05-09 | Disposition: A | Discharge status: Home / Self Care | Payer: Medicare Other | Source: Ambulatory Visit | Attending: Family Medicine | Admitting: Family Medicine

## 2021-05-09 DIAGNOSIS — Z1231 Encounter for screening mammogram for malignant neoplasm of breast: Secondary | ICD-10-CM | POA: Diagnosis not present

## 2021-05-19 DIAGNOSIS — E119 Type 2 diabetes mellitus without complications: Secondary | ICD-10-CM | POA: Diagnosis not present

## 2021-05-19 DIAGNOSIS — Z961 Presence of intraocular lens: Secondary | ICD-10-CM | POA: Diagnosis not present

## 2021-06-01 DIAGNOSIS — H524 Presbyopia: Secondary | ICD-10-CM | POA: Diagnosis not present

## 2021-06-01 DIAGNOSIS — H52203 Unspecified astigmatism, bilateral: Secondary | ICD-10-CM | POA: Diagnosis not present

## 2021-06-23 DIAGNOSIS — N182 Chronic kidney disease, stage 2 (mild): Secondary | ICD-10-CM | POA: Diagnosis not present

## 2021-06-23 DIAGNOSIS — E7849 Other hyperlipidemia: Secondary | ICD-10-CM | POA: Diagnosis not present

## 2021-06-23 DIAGNOSIS — M130 Polyarthritis, unspecified: Secondary | ICD-10-CM | POA: Diagnosis not present

## 2021-06-23 DIAGNOSIS — I129 Hypertensive chronic kidney disease with stage 1 through stage 4 chronic kidney disease, or unspecified chronic kidney disease: Secondary | ICD-10-CM | POA: Diagnosis not present

## 2021-07-19 ENCOUNTER — Ambulatory Visit
Admission: EM | Admit: 2021-07-19 | Discharge: 2021-07-19 | Disposition: A | Discharge status: Home / Self Care | Payer: Medicare Other | Attending: Family Medicine | Admitting: Family Medicine

## 2021-07-19 ENCOUNTER — Emergency Department (HOSPITAL_COMMUNITY): Payer: Medicare Other

## 2021-07-19 ENCOUNTER — Inpatient Hospital Stay (HOSPITAL_COMMUNITY)
Admission: EM | Admit: 2021-07-19 | Discharge: 2021-07-23 | DRG: 175 | Disposition: A | Discharge status: Home Health | Payer: Medicare Other | Attending: Internal Medicine | Admitting: Internal Medicine

## 2021-07-19 ENCOUNTER — Encounter (HOSPITAL_COMMUNITY): Payer: Self-pay | Admitting: Emergency Medicine

## 2021-07-19 ENCOUNTER — Other Ambulatory Visit: Payer: Self-pay

## 2021-07-19 DIAGNOSIS — I5032 Chronic diastolic (congestive) heart failure: Secondary | ICD-10-CM | POA: Diagnosis not present

## 2021-07-19 DIAGNOSIS — I130 Hypertensive heart and chronic kidney disease with heart failure and stage 1 through stage 4 chronic kidney disease, or unspecified chronic kidney disease: Secondary | ICD-10-CM | POA: Diagnosis not present

## 2021-07-19 DIAGNOSIS — R059 Cough, unspecified: Secondary | ICD-10-CM | POA: Diagnosis not present

## 2021-07-19 DIAGNOSIS — Z7984 Long term (current) use of oral hypoglycemic drugs: Secondary | ICD-10-CM | POA: Diagnosis not present

## 2021-07-19 DIAGNOSIS — Z888 Allergy status to other drugs, medicaments and biological substances status: Secondary | ICD-10-CM

## 2021-07-19 DIAGNOSIS — I10 Essential (primary) hypertension: Secondary | ICD-10-CM | POA: Diagnosis not present

## 2021-07-19 DIAGNOSIS — I82441 Acute embolism and thrombosis of right tibial vein: Secondary | ICD-10-CM | POA: Diagnosis not present

## 2021-07-19 DIAGNOSIS — I2699 Other pulmonary embolism without acute cor pulmonale: Principal | ICD-10-CM

## 2021-07-19 DIAGNOSIS — I2694 Multiple subsegmental pulmonary emboli without acute cor pulmonale: Secondary | ICD-10-CM | POA: Diagnosis not present

## 2021-07-19 DIAGNOSIS — E1122 Type 2 diabetes mellitus with diabetic chronic kidney disease: Secondary | ICD-10-CM | POA: Diagnosis present

## 2021-07-19 DIAGNOSIS — Z20822 Contact with and (suspected) exposure to covid-19: Secondary | ICD-10-CM | POA: Diagnosis not present

## 2021-07-19 DIAGNOSIS — I2609 Other pulmonary embolism with acute cor pulmonale: Secondary | ICD-10-CM

## 2021-07-19 DIAGNOSIS — J9601 Acute respiratory failure with hypoxia: Secondary | ICD-10-CM | POA: Diagnosis present

## 2021-07-19 DIAGNOSIS — Z23 Encounter for immunization: Secondary | ICD-10-CM | POA: Diagnosis not present

## 2021-07-19 DIAGNOSIS — I700 Atherosclerosis of aorta: Secondary | ICD-10-CM | POA: Diagnosis not present

## 2021-07-19 DIAGNOSIS — Z833 Family history of diabetes mellitus: Secondary | ICD-10-CM | POA: Diagnosis not present

## 2021-07-19 DIAGNOSIS — Z7982 Long term (current) use of aspirin: Secondary | ICD-10-CM

## 2021-07-19 DIAGNOSIS — E785 Hyperlipidemia, unspecified: Secondary | ICD-10-CM | POA: Diagnosis present

## 2021-07-19 DIAGNOSIS — N1832 Chronic kidney disease, stage 3b: Secondary | ICD-10-CM | POA: Diagnosis present

## 2021-07-19 DIAGNOSIS — R0602 Shortness of breath: Secondary | ICD-10-CM | POA: Diagnosis not present

## 2021-07-19 DIAGNOSIS — R0600 Dyspnea, unspecified: Secondary | ICD-10-CM | POA: Diagnosis not present

## 2021-07-19 DIAGNOSIS — E876 Hypokalemia: Secondary | ICD-10-CM | POA: Diagnosis not present

## 2021-07-19 DIAGNOSIS — I517 Cardiomegaly: Secondary | ICD-10-CM | POA: Diagnosis not present

## 2021-07-19 DIAGNOSIS — Z79899 Other long term (current) drug therapy: Secondary | ICD-10-CM | POA: Diagnosis not present

## 2021-07-19 LAB — CBC
HCT: 37 % (ref 36.0–46.0)
Hemoglobin: 11.9 g/dL — ABNORMAL LOW (ref 12.0–15.0)
MCH: 26.1 pg (ref 26.0–34.0)
MCHC: 32.2 g/dL (ref 30.0–36.0)
MCV: 81.1 fL (ref 80.0–100.0)
Platelets: 153 10*3/uL (ref 150–400)
RBC: 4.56 MIL/uL (ref 3.87–5.11)
RDW: 17.8 % — ABNORMAL HIGH (ref 11.5–15.5)
WBC: 5.6 10*3/uL (ref 4.0–10.5)
nRBC: 0 % (ref 0.0–0.2)

## 2021-07-19 LAB — RESP PANEL BY RT-PCR (FLU A&B, COVID) ARPGX2
Influenza A by PCR: NEGATIVE
Influenza B by PCR: NEGATIVE
SARS Coronavirus 2 by RT PCR: NEGATIVE

## 2021-07-19 LAB — BASIC METABOLIC PANEL
Anion gap: 7 (ref 5–15)
BUN: 20 mg/dL (ref 8–23)
CO2: 24 mmol/L (ref 22–32)
Calcium: 9.3 mg/dL (ref 8.9–10.3)
Chloride: 105 mmol/L (ref 98–111)
Creatinine, Ser: 1.03 mg/dL — ABNORMAL HIGH (ref 0.44–1.00)
GFR, Estimated: 52 mL/min — ABNORMAL LOW (ref >60)
Glucose, Bld: 155 mg/dL — ABNORMAL HIGH (ref 70–99)
Potassium: 3.5 mmol/L (ref 3.5–5.1)
Sodium: 136 mmol/L (ref 135–145)

## 2021-07-19 LAB — BRAIN NATRIURETIC PEPTIDE: B Natriuretic Peptide: 1275 pg/mL — ABNORMAL HIGH (ref 0.0–100.0)

## 2021-07-19 LAB — TROPONIN I (HIGH SENSITIVITY)
Troponin I (High Sensitivity): 21 ng/L — ABNORMAL HIGH (ref <18)
Troponin I (High Sensitivity): 18 ng/L — ABNORMAL HIGH (ref <18)

## 2021-07-19 MED ORDER — AMLODIPINE BESYLATE 5 MG PO TABS
10.0000 mg | ORAL_TABLET | Freq: Once | ORAL | Status: AC
  Administered 2021-07-19: 10 mg via ORAL
  Filled 2021-07-19: qty 2

## 2021-07-19 MED ORDER — FUROSEMIDE 10 MG/ML IJ SOLN: 20.0000 mg | Freq: Once | INTRAMUSCULAR | Status: DC

## 2021-07-19 MED ORDER — IOHEXOL 350 MG/ML SOLN
100.0000 mL | Freq: Once | INTRAVENOUS | Status: AC | PRN
  Administered 2021-07-19: 100 mL via INTRAVENOUS

## 2021-07-19 NOTE — ED Triage Notes (Signed)
Respirations 24 not 14

## 2021-07-19 NOTE — ED Notes (Signed)
Pt ambualted to the restroom without oxygen upon arrival back to room oxygen saturation was 84%. Pt placed back onto 2 LPM via [place].

## 2021-07-19 NOTE — ED Triage Notes (Signed)
Pt sent over from UC after being seen for cough and shortness of breath. Pt states cough has been going on for the past week.

## 2021-07-19 NOTE — ED Triage Notes (Signed)
Patient is being discharged from the Urgent Care and sent to the Emergency Department via private vehicle . Per Dr Mannie Stabile, patient is in need of higher level of care due to hypoxia . Patient is aware and verbalizes understanding of plan of care.  Vitals:   07/19/21 1659 07/19/21 1702  BP: (!) 165/71   Pulse: 73   Resp: 14   Temp: (!) 97.5 F (36.4 C)   SpO2: (!) 83% (!) 83%

## 2021-07-19 NOTE — ED Triage Notes (Signed)
Pt presents with c/ cough for over a week, pt slightly labored but states she has COPD , does not wear 02 at home

## 2021-07-19 NOTE — ED Provider Notes (Signed)
[facility] EMERGENCY DEPARTMENT Provider Note   CSN: [id number] Arrival date & time: 07/19/21  1727     History Chief Complaint  Patient presents with   Cough   Shortness of Breath    Rachel Copeland is a 87 y.o. female with a history of CHF, diabetes mellitus, hyperlipidemia, hypertension, CKD.  Patient presents emergency department with a chief complaint of cough and shortness of breath.  Patient reports that she has had cough and shortness of breath over the last week.  Patient endorses productive cough.  States that mucus is clear to yellow in color.  Patient reports that shortness of breath is worse with exertion.  Patient has no associated chest pain with exertion.  Patient endorses bilateral lower leg swelling, and rhinorrhea.  Patient denies any fevers, chills, nasal congestion, sore throat, hemoptysis, abdominal pain, nausea, vomiting, syncope, lightheadedness, dizziness.    Patient denies any known sick contacts.  Patient has received COVID-19 vaccination as well as booster.  Patient denies using any oxygen at home.   Cough Associated symptoms: rhinorrhea and shortness of breath   Associated symptoms: no chest pain, no chills, no fever, no headaches, no rash and no sore throat   Shortness of Breath Associated symptoms: cough   Associated symptoms: no abdominal pain, no chest pain, no fever, no headaches, no neck pain, no rash, no sore throat and no vomiting       Past Medical History:  Diagnosis Date   CHF (congestive heart failure) ([place])    Diabetes mellitus    no medications   Hyperlipidemia    Hypertension     Patient Active Problem List   Diagnosis Date Noted   Localized swelling of left lower extremity 11/28/2018   Hypokalemia 11/28/2018   Bilateral pneumonia    CAP (community acquired pneumonia) 12/02/2014   HTN (hypertension) 12/02/2014   Dyslipidemia 12/02/2014   Chronic diastolic CHF (congestive heart failure) ([place]) 12/02/2014   Anemia of  chronic disease 12/02/2014   Thrombocytopenia ([place]) 12/02/2014   Chronic kidney disease (CKD), stage IV (severe) ([place]) 12/02/2014   Diabetes mellitus type 2, controlled ([place]) 12/01/2014   Acute respiratory failure with hypoxia ([place]) 12/01/2014   THYROID NODULE 08/06/2009   CATARACTS, BILATERAL 11/20/2006   CAROTID STENOSIS 11/20/2006    Past Surgical History:  Procedure Laterality Date   NO PAST SURGERIES       OB History   No obstetric history on file.     Family History  Problem Relation Age of Onset   Colon cancer Mother    Colon polyps Mother    Diabetes Mellitus II Brother    Diabetes Mellitus II Daughter    Breast cancer Neg Hx    Stomach cancer Neg Hx     Social History   Tobacco Use   Smoking status: Never   Smokeless tobacco: Never  Vaping Use   Vaping Use: Never used  Substance Use Topics   Alcohol use: No   Drug use: No    Home Medications Prior to Admission medications   Medication Sig Start Date End Date Taking? Authorizing Provider  amLODipine (NORVASC) 10 MG tablet Take 10 mg by mouth daily.    [provider]  Ascorbic Acid (VITAMIN C) 1000 MG tablet Take 1,000 mg by mouth every morning.    [provider]  aspirin 81 MG tablet Take 81 mg by mouth daily.    [provider]  calcium-vitamin D (OSCAL WITH D) 500-200 MG-UNIT per tablet Take  1 tablet by mouth every morning.    [provider]  carvedilol (COREG) 25 MG tablet Take 25 mg by mouth 2 (two) times daily with a meal.    [provider]  fish oil-omega-3 fatty acids 1000 MG capsule Take 1 g by mouth every morning.    [provider]  furosemide (LASIX) 40 MG tablet Take 1 tablet (40 mg total) by mouth every morning. 12/06/14   Robbie Lis, MD  Multiple Vitamins-Minerals (MULTIVITAMIN ADULT PO) Take by mouth.    [provider]  potassium chloride (K-DUR) 10 MEQ tablet Take 1 tablet (10 mEq total) by mouth daily. Patient not  taking: Reported on 04/07/2019 12/06/14   Robbie Lis, MD  rosuvastatin (CRESTOR) 10 MG tablet TK 1 T PO QD 03/21/19   [provider]  sitaGLIPtin (JANUVIA) 100 MG tablet Take 100 mg by mouth daily.    [provider]  valsartan-hydrochlorothiazide (DIOVAN-HCT) 320-25 MG per tablet Take 1 tablet by mouth daily.    [provider]    Allergies    Sitagliptin phosphate  Review of Systems   Review of Systems  Constitutional:  Negative for chills and fever.  HENT:  Positive for rhinorrhea. Negative for congestion and sore throat.   Eyes:  Negative for visual disturbance.  Respiratory:  Positive for cough and shortness of breath.   Cardiovascular:  Positive for leg swelling. Negative for chest pain and palpitations.  Gastrointestinal:  Negative for abdominal pain, nausea and vomiting.  Genitourinary:  Negative for difficulty urinating, dysuria, frequency and hematuria.  Musculoskeletal:  Negative for back pain and neck pain.  Skin:  Negative for color change and rash.  Neurological:  Negative for dizziness, tremors, seizures, syncope, facial asymmetry, speech difficulty, weakness, light-headedness, numbness and headaches.  Psychiatric/Behavioral:  Negative for confusion.    Physical Exam Updated Vital Signs BP (!) 189/98   Pulse 74   Temp 98 F (36.7 C) (Oral)   Resp 20   Ht '5\' 5"'$  (1.651 m)   Wt 87.8 kg   SpO2 (!) 88%   BMI 32.21 kg/m   Physical Exam Vitals and nursing note reviewed.  Constitutional:      General: She is not in acute distress.    Appearance: She is not ill-appearing, toxic-appearing or diaphoretic.  HENT:     Head: Normocephalic.  Eyes:     General: No scleral icterus.       Right eye: No discharge.        Left eye: No discharge.  Cardiovascular:     Rate and Rhythm: Normal rate.     Pulses:          Radial pulses are 2+ on the right side and 2+ on the left side.     Heart sounds: Normal heart sounds.  Pulmonary:      Effort: Pulmonary effort is normal. No tachypnea, bradypnea or respiratory distress.     Breath sounds: Normal breath sounds. No stridor.     Comments: Patient states difficulty sepsis without difficulty. Oxygen saturation drops to 84% on room air with patient sitting at baseline Abdominal:     General: There is no distension. There are no signs of injury.     Palpations: Abdomen is soft. There is no mass or pulsatile mass.     Tenderness: There is no abdominal tenderness. There is no guarding or rebound.     Hernia: There is no hernia in the umbilical area or ventral area.  Musculoskeletal:     Cervical back: Neck supple.     Right lower leg: No swelling or tenderness. 1+ Edema present.     Left lower leg: No swelling or tenderness. 1+ Edema present.  Skin:    General: Skin is warm and dry.  Neurological:     General: No focal deficit present.     Mental Status: She is alert.  Psychiatric:        Behavior: Behavior is cooperative.    ED Results / Procedures / Treatments   Labs (all labs ordered are listed, but only abnormal results are displayed) Labs Reviewed  CBC - Abnormal; Notable for the following components:      Result Value   Hemoglobin 11.9 (*)    RDW 17.8 (*)    All other components within normal limits  BASIC METABOLIC PANEL - Abnormal; Notable for the following components:   Glucose, Bld 155 (*)    Creatinine, Ser 1.03 (*)    GFR, Estimated 52 (*)    All other components within normal limits  TROPONIN I (HIGH SENSITIVITY) - Abnormal; Notable for the following components:   Troponin I (High Sensitivity) 21 (*)    All other components within normal limits  TROPONIN I (HIGH SENSITIVITY) - Abnormal; Notable for the following components:   Troponin I (High Sensitivity) 18 (*)    All other components within normal limits  RESP PANEL BY RT-PCR (FLU A&B, COVID) [id number]    EKG None  Radiology DG Chest 2 View  Result Date: 07/19/2021 CLINICAL DATA:  Cough and  shortness of breath x1 week. EXAM: CHEST - 2 VIEW COMPARISON:  November 28, 2018 FINDINGS: Mild atelectasis and/or early infiltrate is seen within the mid right lung and left lung base. There is no evidence of a pleural effusion or pneumothorax. The cardiac silhouette is moderately enlarged and unchanged in size. There is stable prominence of the right hilar pulmonary vasculature. Marked severity calcification of the aortic arch is seen with marked severity tortuosity of the descending thoracic aorta. The visualized skeletal structures are unremarkable. IMPRESSION: Stable cardiomegaly with mild mid right lung and left basilar atelectasis and/or infiltrate. Electronically Signed   By: Virgina Norfolk M.D.   On: 07/19/2021 19:18   CT Angio Chest PE W and/or Wo Contrast  Result Date: 07/20/2021 CLINICAL DATA:  87 year old female with concern for pulmonary embolism. EXAM: CT ANGIOGRAPHY CHEST WITH CONTRAST TECHNIQUE: Multidetector CT imaging of the chest was performed using the standard protocol during bolus administration of intravenous contrast. Multiplanar CT image reconstructions and MIPs were obtained to evaluate the vascular anatomy. CONTRAST:  140m OMNIPAQUE IOHEXOL 350 MG/ML SOLN COMPARISON:  Chest radiograph dated 07/19/2021. FINDINGS: Cardiovascular: There is mild cardiomegaly. There is dilatation of the right atrium with retrograde flow of contrast from the right atrium into the IVC indicative right heart dysfunction. Correlation with echocardiogram recommended. No pericardial effusion. There is coronary vascular calcification. Moderate atherosclerotic calcification of the thoracic aorta. No aneurysmal dilatation. Evaluation of the aorta is limited due to suboptimal opacification and timing of the contrast. There is a left-sided aortic arch with aberrant right subclavian artery anatomy. Large bilateral pulmonary artery emboli involving the lobar branches extending into the segmental and subsegmental  branches. There is large clot burden in the right lower lobe. No CT evidence of right heart straining. The right ventricular lumen measures approximately 4 cm and the left ventricular lumen measures 5.4 cm for a ratio of 0.7. Mediastinum/Nodes: No hilar or mediastinal adenopathy. The  esophagus is grossly unremarkable. No mediastinal fluid collection. Lungs/Pleura: Patchy area of ground-glass opacity involving the left lung base may represent atelectasis or infiltrate 6. Developing infarct is not excluded. There is no pleural effusion pneumothorax. The central airways are patent. Upper Abdomen: No acute abnormality. Musculoskeletal: Degenerative changes of the spine and osteopenia. No acute osseous pathology. Review of the MIP images confirms the above findings. IMPRESSION: 1. Large bilateral pulmonary artery emboli involving the lobar branches extending into the segmental and subsegmental branches. No CT evidence of right heart straining. 2. Left lung base atelectasis versus infiltrate. Developing infarct is not excluded. 3. Mild cardiomegaly with evidence of right heart dysfunction. Correlation with echocardiogram recommended. 4. Left-sided aortic arch with aberrant right subclavian artery anatomy. 5. Aortic Atherosclerosis (ICD10-I70.0). These results were called by telephone at the time of interpretation on 07/19/2021 at 11:59 pm to provider [facility] , who verbally acknowledged these results. Electronically Signed   By: Anner Crete M.D.   On: 07/20/2021 00:02    Procedures .Critical Care  Date/Time: 07/20/2021 12:36 AM Performed by: Loni Beckwith, PA-C Authorized by: Loni Beckwith, PA-C   Critical care provider statement:    Critical care time (minutes):  45   Critical care was necessary to treat or prevent imminent or life-threatening deterioration of the following conditions:  Cardiac failure   Critical care was time spent personally by me on the following activities:   Evaluation of patient's response to treatment, examination of patient, ordering and performing treatments and interventions, ordering and review of laboratory studies, ordering and review of radiographic studies, pulse oximetry, re-evaluation of patient's condition, obtaining history from patient or surrogate and review of old charts   Medications Ordered in ED Medications  iohexol (OMNIPAQUE) 350 MG/ML injection 100 mL (100 mLs Intravenous Contrast Given 07/19/21 2247)  amLODipine (NORVASC) tablet 10 mg (10 mg Oral Given 07/19/21 2305)    ED Course  I have reviewed the triage vital signs and the nursing notes.  Pertinent labs & imaging results that were available during my care of the patient were reviewed by me and considered in my medical decision making (see chart for details).  Clinical Course as of 07/20/21 0035  Tue Jul 19, 2021  2359 Consulted by radiologist who states that patient has large bilateral PEs with no right heart strain. [PB]    Clinical Course User Index [PB] Dyann Ruddle   MDM Rules/Calculators/A&P                           Alert 87 year old female no acute distress, nontoxic-appearing.  Patient on 4 LPM of O2 via nasal cannula.  Presents with a chief complaint of productive cough and shortness of breath.  Symptoms have been present over the last week.  Patient noted to have oxygen saturation dropped down to low 80s on room air.  Lungs clear to auscultation bilaterally.  +1 swelling to bilateral lower extremities.    BMP, CBC, troponin were obtained while patient was in triage Troponin shows elevations of 21 and 18 BMP shows creatinine slightly elevated at 1.03 CBC unremarkable CXR shows Stable cardiomegaly with mild mid right lung and left basilar atelectasis and/or infiltrate.  Patient was noted to be hypertensive.  Patient reports that she did not receive her nightly hypertensive medication.  We will give patient her 10 mg of Norvasc.  Due to  patient's hypoxia will obtain respiratory panel, CTA chest, and BNP. Respiratory panel  negative for COVID-19 or influenza BNP elevated at 1275 CTA shows large bilateral pulmonary artery emboli involving the lobar branches extending into the segmental and subsegmental branches.  No CT evidence of right heart strain.   Patient started on heparin drip.  Will consult pulmonology due to patient's CTA findings.  Patient care transferred to [place] at the end of my shift. Patient presentation, ED course, and plan of care discussed with review of all pertinent labs and imaging. Please see his/her note for further details regarding further ED course and disposition.   Final Clinical Impression(s) / ED Diagnoses Final diagnoses:  Acute pulmonary embolism without acute cor pulmonale, unspecified pulmonary embolism type [facility])    Rx / DC Orders ED Discharge Orders     None        Dyann Ruddle 07/20/21 0036    Ezequiel Essex, MD 07/20/21 0600

## 2021-07-20 ENCOUNTER — Inpatient Hospital Stay (HOSPITAL_COMMUNITY): Payer: Medicare Other

## 2021-07-20 DIAGNOSIS — I82443 Acute embolism and thrombosis of tibial vein, bilateral: Secondary | ICD-10-CM | POA: Diagnosis not present

## 2021-07-20 DIAGNOSIS — Z7984 Long term (current) use of oral hypoglycemic drugs: Secondary | ICD-10-CM | POA: Diagnosis not present

## 2021-07-20 DIAGNOSIS — Z79899 Other long term (current) drug therapy: Secondary | ICD-10-CM | POA: Diagnosis not present

## 2021-07-20 DIAGNOSIS — I130 Hypertensive heart and chronic kidney disease with heart failure and stage 1 through stage 4 chronic kidney disease, or unspecified chronic kidney disease: Secondary | ICD-10-CM | POA: Diagnosis present

## 2021-07-20 DIAGNOSIS — I2699 Other pulmonary embolism without acute cor pulmonale: Secondary | ICD-10-CM

## 2021-07-20 DIAGNOSIS — N1832 Chronic kidney disease, stage 3b: Secondary | ICD-10-CM | POA: Diagnosis present

## 2021-07-20 DIAGNOSIS — I2694 Multiple subsegmental pulmonary emboli without acute cor pulmonale: Secondary | ICD-10-CM

## 2021-07-20 DIAGNOSIS — R0600 Dyspnea, unspecified: Secondary | ICD-10-CM | POA: Diagnosis present

## 2021-07-20 DIAGNOSIS — E785 Hyperlipidemia, unspecified: Secondary | ICD-10-CM | POA: Diagnosis present

## 2021-07-20 DIAGNOSIS — Z888 Allergy status to other drugs, medicaments and biological substances status: Secondary | ICD-10-CM | POA: Diagnosis not present

## 2021-07-20 DIAGNOSIS — Z20822 Contact with and (suspected) exposure to covid-19: Secondary | ICD-10-CM | POA: Diagnosis present

## 2021-07-20 DIAGNOSIS — I10 Essential (primary) hypertension: Secondary | ICD-10-CM | POA: Diagnosis not present

## 2021-07-20 DIAGNOSIS — Z86711 Personal history of pulmonary embolism: Secondary | ICD-10-CM | POA: Diagnosis not present

## 2021-07-20 DIAGNOSIS — J9601 Acute respiratory failure with hypoxia: Secondary | ICD-10-CM

## 2021-07-20 DIAGNOSIS — I5032 Chronic diastolic (congestive) heart failure: Secondary | ICD-10-CM | POA: Diagnosis present

## 2021-07-20 DIAGNOSIS — I82413 Acute embolism and thrombosis of femoral vein, bilateral: Secondary | ICD-10-CM | POA: Diagnosis not present

## 2021-07-20 DIAGNOSIS — I82441 Acute embolism and thrombosis of right tibial vein: Secondary | ICD-10-CM | POA: Diagnosis present

## 2021-07-20 DIAGNOSIS — E876 Hypokalemia: Secondary | ICD-10-CM | POA: Diagnosis not present

## 2021-07-20 DIAGNOSIS — I2609 Other pulmonary embolism with acute cor pulmonale: Secondary | ICD-10-CM | POA: Diagnosis not present

## 2021-07-20 DIAGNOSIS — E1122 Type 2 diabetes mellitus with diabetic chronic kidney disease: Secondary | ICD-10-CM | POA: Diagnosis present

## 2021-07-20 DIAGNOSIS — Z7982 Long term (current) use of aspirin: Secondary | ICD-10-CM | POA: Diagnosis not present

## 2021-07-20 DIAGNOSIS — R0602 Shortness of breath: Secondary | ICD-10-CM | POA: Diagnosis not present

## 2021-07-20 DIAGNOSIS — Z833 Family history of diabetes mellitus: Secondary | ICD-10-CM | POA: Diagnosis not present

## 2021-07-20 DIAGNOSIS — Z23 Encounter for immunization: Secondary | ICD-10-CM | POA: Diagnosis not present

## 2021-07-20 LAB — ECHOCARDIOGRAM COMPLETE
AR max vel: 2.56 cm2
AV Area VTI: 2.66 cm2
AV Area mean vel: 2.34 cm2
AV Mean grad: 3 mmHg
AV Peak grad: 5.1 mmHg
Ao pk vel: 1.13 m/s
Area-P 1/2: 3.15 cm2
Calc EF: 48.4 %
Height: 65 in
MV VTI: 2.17 cm2
S' Lateral: 2.24 cm
Single Plane A2C EF: 33.7 %
Single Plane A4C EF: 54.3 %
Weight: 3097.02 oz

## 2021-07-20 LAB — GLUCOSE, CAPILLARY: Glucose-Capillary: 142 mg/dL — ABNORMAL HIGH (ref 70–99)

## 2021-07-20 LAB — HEMOGLOBIN A1C
Hgb A1c MFr Bld: 7.5 % — ABNORMAL HIGH (ref 4.8–5.6)
Mean Plasma Glucose: 169 mg/dL

## 2021-07-20 LAB — CBG MONITORING, ED
Glucose-Capillary: 127 mg/dL — ABNORMAL HIGH (ref 70–99)
Glucose-Capillary: 162 mg/dL — ABNORMAL HIGH (ref 70–99)
Glucose-Capillary: 112 mg/dL — ABNORMAL HIGH (ref 70–99)

## 2021-07-20 LAB — HEPARIN LEVEL (UNFRACTIONATED)
Heparin Unfractionated: 0.7 IU/mL (ref 0.30–0.70)
Heparin Unfractionated: 0.69 IU/mL (ref 0.30–0.70)

## 2021-07-20 MED ORDER — ONDANSETRON HCL 4 MG PO TABS
4.0000 mg | ORAL_TABLET | Freq: Four times a day (QID) | ORAL | Status: DC | PRN
  Administered 2021-07-21: 4 mg via ORAL
  Filled 2021-07-20: qty 1

## 2021-07-20 MED ORDER — ASPIRIN EC 81 MG PO TBEC
81.0000 mg | DELAYED_RELEASE_TABLET | Freq: Every day | ORAL | Status: DC
  Administered 2021-07-21 – 2021-07-23 (×3): 81 mg via ORAL
  Filled 2021-07-20 (×4): qty 1

## 2021-07-20 MED ORDER — SODIUM CHLORIDE 0.9 % IV SOLN
1.0000 g | Freq: Once | INTRAVENOUS | Status: AC
  Administered 2021-07-20: 1 g via INTRAVENOUS
  Filled 2021-07-20: qty 10

## 2021-07-20 MED ORDER — INSULIN ASPART 100 UNIT/ML IJ SOLN: 0.0000 [IU] | Freq: Every day | INTRAMUSCULAR | Status: DC

## 2021-07-20 MED ORDER — HEPARIN BOLUS VIA INFUSION
4000.0000 [IU] | Freq: Once | INTRAVENOUS | Status: AC
  Administered 2021-07-20: 4000 [IU] via INTRAVENOUS

## 2021-07-20 MED ORDER — ONDANSETRON HCL 4 MG/2ML IJ SOLN: 4.0000 mg | Freq: Four times a day (QID) | INTRAMUSCULAR | Status: DC | PRN

## 2021-07-20 MED ORDER — INSULIN ASPART 100 UNIT/ML IJ SOLN
0.0000 [IU] | Freq: Three times a day (TID) | INTRAMUSCULAR | Status: DC
  Administered 2021-07-21: 1 [IU] via SUBCUTANEOUS
  Administered 2021-07-21: 2 [IU] via SUBCUTANEOUS
  Administered 2021-07-21: 1 [IU] via SUBCUTANEOUS
  Administered 2021-07-22: 2 [IU] via SUBCUTANEOUS
  Administered 2021-07-22: 1 [IU] via SUBCUTANEOUS
  Administered 2021-07-23: 2 [IU] via SUBCUTANEOUS

## 2021-07-20 MED ORDER — INSULIN ASPART 100 UNIT/ML IJ SOLN
0.0000 [IU] | Freq: Three times a day (TID) | INTRAMUSCULAR | Status: DC
  Administered 2021-07-20: 3 [IU] via SUBCUTANEOUS
  Filled 2021-07-20: qty 1

## 2021-07-20 MED ORDER — SODIUM CHLORIDE 0.9 % IV SOLN
500.0000 mg | Freq: Once | INTRAVENOUS | Status: AC
  Administered 2021-07-20: 500 mg via INTRAVENOUS
  Filled 2021-07-20: qty 500

## 2021-07-20 MED ORDER — CARVEDILOL 12.5 MG PO TABS
25.0000 mg | ORAL_TABLET | Freq: Two times a day (BID) | ORAL | Status: DC
  Administered 2021-07-20 – 2021-07-23 (×7): 25 mg via ORAL
  Filled 2021-07-20 (×7): qty 2

## 2021-07-20 MED ORDER — OXYCODONE HCL 5 MG PO TABS: 5.0000 mg | ORAL_TABLET | ORAL | Status: DC | PRN

## 2021-07-20 MED ORDER — VALSARTAN-HYDROCHLOROTHIAZIDE 320-25 MG PO TABS: 1.0000 | ORAL_TABLET | Freq: Every day | ORAL | Status: DC

## 2021-07-20 MED ORDER — ACETAMINOPHEN 325 MG PO TABS: 650.0000 mg | ORAL_TABLET | Freq: Four times a day (QID) | ORAL | Status: DC | PRN

## 2021-07-20 MED ORDER — AMLODIPINE BESYLATE 5 MG PO TABS
10.0000 mg | ORAL_TABLET | Freq: Every day | ORAL | Status: DC
  Administered 2021-07-20 – 2021-07-23 (×4): 10 mg via ORAL
  Filled 2021-07-20 (×4): qty 2

## 2021-07-20 MED ORDER — ROSUVASTATIN CALCIUM 20 MG PO TABS
20.0000 mg | ORAL_TABLET | Freq: Every day | ORAL | Status: DC
  Administered 2021-07-20 – 2021-07-23 (×4): 20 mg via ORAL
  Filled 2021-07-20 (×4): qty 1

## 2021-07-20 MED ORDER — ASPIRIN 81 MG PO CHEW
CHEWABLE_TABLET | ORAL | Status: AC
  Filled 2021-07-20: qty 1

## 2021-07-20 MED ORDER — HEPARIN (PORCINE) 25000 UT/250ML-% IV SOLN
950.0000 [IU]/h | INTRAVENOUS | Status: DC
  Administered 2021-07-20: 1300 [IU]/h via INTRAVENOUS
  Administered 2021-07-20: 1350 [IU]/h via INTRAVENOUS
  Administered 2021-07-21: 1150 [IU]/h via INTRAVENOUS
  Administered 2021-07-22: 950 [IU]/h via INTRAVENOUS
  Filled 2021-07-20 (×4): qty 250

## 2021-07-20 MED ORDER — ACETAMINOPHEN 650 MG RE SUPP: 650.0000 mg | Freq: Four times a day (QID) | RECTAL | Status: DC | PRN

## 2021-07-20 NOTE — ED Provider Notes (Signed)
Patient here with shortness of breath and cough over the past several days.  CT scan is concerning for large pulmonary emboli without right heart strain   D/w PCCM who feels patient can stay at [facility] for IV heparin.  Without hypotension or significantly elevated troponin do not feel she needs transfer for lytics. Blood pressure remained stable. Work of breathing remained stable on 4 L nasal cannula  Patient remained stable on nasal cannula.  No hypotension.  Critical care does not feel she needs transfer for lytics.  Continue IV heparin.  Admission discussed with Dr. Clearence Ped.   .Critical Care  Date/Time: 07/20/2021 5:59 AM Performed by: Ezequiel Essex, MD Authorized by: Ezequiel Essex, MD   Critical care provider statement:    Critical care time (minutes):  45   Critical care was necessary to treat or prevent imminent or life-threatening deterioration of the following conditions:  Respiratory failure   Critical care was time spent personally by me on the following activities:  Discussions with consultants, evaluation of patient's response to treatment, examination of patient, ordering and performing treatments and interventions, ordering and review of laboratory studies, ordering and review of radiographic studies, pulse oximetry, re-evaluation of patient's condition, obtaining history from patient or surrogate and review of old charts    Rachel Copeland, [name], MD 07/20/21 [phone]

## 2021-07-20 NOTE — Progress Notes (Signed)
PROGRESS NOTE  Rachel Copeland [id number] DOB: Dec 03, 1929 DOA: 07/19/2021 PCP: Lucianne Lei, MD  Brief History:  87 year old female with a history of diabetes mellitus type 2, hyperlipidemia, hypertension, CKD stage III presenting with 2-week history of worsening shortness of breath and coughing with clear yellow sputum.  States that she is primarily short of breath with exertion.  She denies any fevers, chills, chest pain, nausea, vomiting or diarrhea, abdominal pain.  She states that her leg edema is about the same if not a little bit better than usual.  She denies any hemoptysis.  She denies any recent surgery or travels.  She has not had any recent changes in any of her medications.  She has had extensive secondhand exposure to tobacco smoke from her late husband.  She has never smoked herself.  She denies any recent surgeries or trauma. In the emergency department, the patient was afebrile hemodynamically stable with oxygen saturation in the 80s on room air.  Oxygen saturation was 90-92% on 4 L.  CTA chest was positive for pulmonary emboli.  The patient was started on IV heparin.  Assessment/Plan: Acute respiratory failure with hypoxia -Secondary to pulmonary emboli -Currently stable on 4 L -Patient remains hemodynamically stable -07/19/2021 CTA chest large bilateral pulmonary artery emboli; LLL atelectasis versus infiltrate ?  Infarct; no RV strain noted  Acute pulmonary emboli -07/20/2021 echo EF 50-55%, no WMA, IV septum flattening suggesting RV overload.  Moderate TR -Continue IV heparin -Venous duplex legs--DVT right posterior tibial and left femoral veins  Diabetes mellitus type 2 -This is diet-controlled -Check A1c -NovoLog sliding scale for now  Essential hypertension -Holding Diovan HCTZ -Continue amlodipine and carvedilol  Hyperlipidemia -Continue Crestor  CKD stage IIIb -Baseline creatinine 1.0-1.3 -Monitor serial BMP  Chronic diastolic  CHF -Appears clinically euvolemic      Status is: Inpatient  Remains inpatient appropriate because:Inpatient level of care appropriate due to severity of illness  Dispo: The patient is from: Home              Anticipated d/c is to: Home              Patient currently is not medically stable to d/c.   Difficult to place patient No        Family Communication: no  Family at bedside  Consultants:  none  Code Status:  FULL   DVT Prophylaxis:  IV Heparin    Procedures: As Listed in Progress Note Above  Antibiotics: None  Total time spent 35 minutes.  Greater than 50% spent face to face counseling and coordinating care.     Subjective: Patient has some shortness of breath but states that it is better than last couple days.  She denies any chest pain, hemoptysis, nausea, vomiting, diarrhea, fever, chills.  Objective: Vitals:   07/20/21 1244 07/20/21 1251 07/20/21 1430 07/20/21 1730  BP:   125/72 139/65  Pulse: (!) 54 (!) 55 62 64  Resp: (!) '21  20 18  '$ Temp:      TempSrc:      SpO2: 91% 97% 92% 92%  Weight:      Height:        Intake/Output Summary (Last 24 hours) at 07/20/2021 1753 Last data filed at 07/20/2021 1058 Gross per 24 hour  Intake 350 ml  Output 1700 ml  Net -1350 ml   Weight change:  Exam:  General:  Pt is alert, follows commands appropriately, not in  acute distress HEENT: No icterus, No thrush, No neck mass, /AT Cardiovascular: RRR, S1/S2, no rubs, no gallops Respiratory: Bibasilar rales.  No wheezing.  Good air movement Abdomen: Soft/+BS, non tender, non distended, no guarding Extremities: No edema, No lymphangitis, No petechiae, No rashes, no synovitis   Data Reviewed: I have personally reviewed following labs and imaging studies Basic Metabolic Panel: Recent Labs  Lab 07/19/21 1835  NA 136  K 3.5  CL 105  CO2 24  GLUCOSE 155*  BUN 20  CREATININE 1.03*  CALCIUM 9.3   Liver Function Tests: No results for input(s): AST,  ALT, ALKPHOS, BILITOT, PROT, ALBUMIN in the last 168 hours. No results for input(s): LIPASE, AMYLASE in the last 168 hours. No results for input(s): AMMONIA in the last 168 hours. Coagulation Profile: No results for input(s): INR, PROTIME in the last 168 hours. CBC: Recent Labs  Lab 07/19/21 1835  WBC 5.6  HGB 11.9*  HCT 37.0  MCV 81.1  PLT 153   Cardiac Enzymes: No results for input(s): CKTOTAL, CKMB, CKMBINDEX, TROPONINI in the last 168 hours. BNP: Invalid input(s): POCBNP CBG: Recent Labs  Lab 07/20/21 0848 07/20/21 1158 07/20/21 1727  GLUCAP 127* 162* 112*   HbA1C: No results for input(s): HGBA1C in the last 72 hours. Urine analysis:    Component Value Date/Time   COLORURINE YELLOW 11/29/2018 [place] 11/29/2018 0414   LABSPEC 1.008 11/29/2018 0414   PHURINE 5.0 11/29/2018 0414   GLUCOSEU NEGATIVE 11/29/2018 0414   HGBUR SMALL (A) 11/29/2018 0414   HGBUR negative 04/22/2008 1009   BILIRUBINUR NEGATIVE 11/29/2018 0414   KETONESUR NEGATIVE 11/29/2018 0414   PROTEINUR NEGATIVE 11/29/2018 0414   UROBILINOGEN 0.2 12/01/2014 2103   NITRITE NEGATIVE 11/29/2018 0414   LEUKOCYTESUR TRACE (A) 11/29/2018 0414   Sepsis Labs: '@LABRCNTIP'$ (procalcitonin:4,lacticidven:4) ) Recent Results (from the past 240 hour(s))  Resp Panel by RT-PCR (Flu A&B, Covid) Nasopharyngeal Swab     Status: None   Collection Time: 07/19/21  8:58 PM   Specimen: Nasopharyngeal Swab; Nasopharyngeal(NP) swabs in vial transport medium  Result Value Ref Range Status   SARS Coronavirus 2 by RT PCR NEGATIVE NEGATIVE Final    Comment: (NOTE) SARS-CoV-2 target nucleic acids are NOT DETECTED.  The SARS-CoV-2 RNA is generally detectable in upper respiratory specimens during the acute phase of infection. The lowest concentration of SARS-CoV-2 viral copies this assay can detect is 138 copies/mL. A negative result does not preclude SARS-Cov-2 infection and should not be used as the sole  basis for treatment or other patient management decisions. A negative result may occur with  improper specimen collection/handling, submission of specimen other than nasopharyngeal swab, presence of viral mutation(s) within the areas targeted by this assay, and inadequate number of viral copies(<138 copies/mL). A negative result must be combined with clinical observations, patient history, and epidemiological information. The expected result is Negative.  Fact Sheet for Patients:  EntrepreneurPulse.com.au  Fact Sheet for Healthcare Providers:  IncredibleEmployment.be  This test is no t yet approved or cleared by the [place] FDA and  has been authorized for detection and/or diagnosis of SARS-CoV-2 by FDA under an Emergency Use Authorization (EUA). This EUA will remain  in effect (meaning this test can be used) for the duration of the COVID-19 declaration under Section 564(b)(1) of the Act, 21 U.S.C.section 360bbb-3(b)(1), unless the authorization is terminated  or revoked sooner.       Influenza A by PCR NEGATIVE NEGATIVE Final   Influenza B by  PCR NEGATIVE NEGATIVE Final    Comment: (NOTE) The Xpert Xpress SARS-CoV-2/FLU/RSV plus assay is intended as an aid in the diagnosis of influenza from Nasopharyngeal swab specimens and should not be used as a sole basis for treatment. Nasal washings and aspirates are unacceptable for Xpert Xpress SARS-CoV-2/FLU/RSV testing.  Fact Sheet for Patients: EntrepreneurPulse.com.au  Fact Sheet for Healthcare Providers: IncredibleEmployment.be  This test is not yet approved or cleared by the [place] FDA and has been authorized for detection and/or diagnosis of SARS-CoV-2 by FDA under an Emergency Use Authorization (EUA). This EUA will remain in effect (meaning this test can be used) for the duration of the COVID-19 declaration under Section 564(b)(1) of the Act,  21 U.S.C. section 360bbb-3(b)(1), unless the authorization is terminated or revoked.  Performed at [facility], [address] [place], [place] [address]      Scheduled Meds:  amLODipine  10 mg Oral Daily   aspirin EC  81 mg Oral Daily   carvedilol  25 mg Oral BID WC   insulin aspart  0-15 Units Subcutaneous TID WC   insulin aspart  0-5 Units Subcutaneous QHS   rosuvastatin  20 mg Oral Daily   Continuous Infusions:  heparin 1,300 Units/hr (07/20/21 1632)    Procedures/Studies: DG Chest 2 View  Result Date: 07/19/2021 CLINICAL DATA:  Cough and shortness of breath x1 week. EXAM: CHEST - 2 VIEW COMPARISON:  November 28, 2018 FINDINGS: Mild atelectasis and/or early infiltrate is seen within the mid right lung and left lung base. There is no evidence of a pleural effusion or pneumothorax. The cardiac silhouette is moderately enlarged and unchanged in size. There is stable prominence of the right hilar pulmonary vasculature. Marked severity calcification of the aortic arch is seen with marked severity tortuosity of the descending thoracic aorta. The visualized skeletal structures are unremarkable. IMPRESSION: Stable cardiomegaly with mild mid right lung and left basilar atelectasis and/or infiltrate. Electronically Signed   By: Virgina Norfolk M.D.   On: 07/19/2021 19:18   CT Angio Chest PE W and/or Wo Contrast  Result Date: 07/20/2021 CLINICAL DATA:  87 year old female with concern for pulmonary embolism. EXAM: CT ANGIOGRAPHY CHEST WITH CONTRAST TECHNIQUE: Multidetector CT imaging of the chest was performed using the standard protocol during bolus administration of intravenous contrast. Multiplanar CT image reconstructions and MIPs were obtained to evaluate the vascular anatomy. CONTRAST:  184m OMNIPAQUE IOHEXOL 350 MG/ML SOLN COMPARISON:  Chest radiograph dated 07/19/2021. FINDINGS: Cardiovascular: There is mild cardiomegaly. There is dilatation of the right atrium with retrograde flow of  contrast from the right atrium into the IVC indicative right heart dysfunction. Correlation with echocardiogram recommended. No pericardial effusion. There is coronary vascular calcification. Moderate atherosclerotic calcification of the thoracic aorta. No aneurysmal dilatation. Evaluation of the aorta is limited due to suboptimal opacification and timing of the contrast. There is a left-sided aortic arch with aberrant right subclavian artery anatomy. Large bilateral pulmonary artery emboli involving the lobar branches extending into the segmental and subsegmental branches. There is large clot burden in the right lower lobe. No CT evidence of right heart straining. The right ventricular lumen measures approximately 4 cm and the left ventricular lumen measures 5.4 cm for a ratio of 0.7. Mediastinum/Nodes: No hilar or mediastinal adenopathy. The esophagus is grossly unremarkable. No mediastinal fluid collection. Lungs/Pleura: Patchy area of ground-glass opacity involving the left lung base may represent atelectasis or infiltrate 6. Developing infarct is not excluded. There is no pleural effusion pneumothorax. The central airways  are patent. Upper Abdomen: No acute abnormality. Musculoskeletal: Degenerative changes of the spine and osteopenia. No acute osseous pathology. Review of the MIP images confirms the above findings. IMPRESSION: 1. Large bilateral pulmonary artery emboli involving the lobar branches extending into the segmental and subsegmental branches. No CT evidence of right heart straining. 2. Left lung base atelectasis versus infiltrate. Developing infarct is not excluded. 3. Mild cardiomegaly with evidence of right heart dysfunction. Correlation with echocardiogram recommended. 4. Left-sided aortic arch with aberrant right subclavian artery anatomy. 5. Aortic Atherosclerosis (ICD10-I70.0). These results were called by telephone at the time of interpretation on 07/19/2021 at 11:59 pm to provider [facility] , who verbally acknowledged these results. Electronically Signed   By: Anner Crete M.D.   On: 07/20/2021 00:02   US Venous Img Lower Bilateral (DVT)  Result Date: 07/20/2021 CLINICAL DATA:  Pulmonary embolism. Shortness of breath for 2 weeks. EXAM: BILATERAL LOWER EXTREMITY VENOUS DOPPLER ULTRASOUND TECHNIQUE: Gray-scale sonography with graded compression, as well as color Doppler and duplex ultrasound were performed to evaluate the lower extremity deep venous systems from the level of the common femoral vein and including the common femoral, femoral, profunda femoral, popliteal and calf veins including the posterior tibial, peroneal and gastrocnemius veins when visible. The superficial great saphenous vein was also interrogated. Spectral Doppler was utilized to evaluate flow at rest and with distal augmentation maneuvers in the common femoral, femoral and popliteal veins. COMPARISON:  None. FINDINGS: RIGHT LOWER EXTREMITY Common Femoral Vein: No evidence of thrombus. Normal compressibility, respiratory phasicity and response to augmentation. Saphenofemoral Junction: No evidence of thrombus. Normal compressibility and flow on color Doppler imaging. Profunda Femoral Vein: No evidence of thrombus. Normal compressibility and flow on color Doppler imaging. Femoral Vein: No evidence of thrombus. Normal compressibility, respiratory phasicity and response to augmentation. Popliteal Vein: Occlusive thrombus identified within the posterior tibial vein. Calf Veins: No evidence of thrombus. Normal compressibility and flow on color Doppler imaging. Superficial Great Saphenous Vein: No evidence of thrombus. Normal compressibility. Venous Reflux:  None. Other Findings:  None. LEFT LOWER EXTREMITY Common Femoral Vein: No evidence of thrombus. Normal compressibility, respiratory phasicity and response to augmentation. Saphenofemoral Junction: No evidence of thrombus. Normal compressibility and flow on color  Doppler imaging. Profunda Femoral Vein: No evidence of thrombus. Normal compressibility and flow on color Doppler imaging. Femoral Vein: Occlusive thrombus noted. Popliteal Vein: Appears normal. Calf Veins: Appears normal Superficial Great Saphenous Vein: No evidence of thrombus. Normal compressibility. Venous Reflux:  None. Other Findings:  None. IMPRESSION: Exam positive for deep vein thrombi within the right posterior tibial vein and left femoral vein. Electronically Signed   By: Kerby Moors M.D.   On: 07/20/2021 12:24   ECHOCARDIOGRAM COMPLETE  Result Date: 07/20/2021    ECHOCARDIOGRAM REPORT   Patient Name:   ELISHA ALCANTARA [facility] Date of Exam: 07/20/2021 Medical Rec #:  [id number]           Height:       65.0 in Accession #:    [id number]          Weight:       193.6 lb Date of Birth:  03/22/1929           BSA:          1.951 m Patient Age:    89 years            BP:           130/60 mmHg Patient Gender: F  HR:           65 bpm. Exam Location:  [facility] Procedure: 2D Echo, Cardiac Doppler and Color Doppler Indications:    Pulmonary embolus  History:        Patient has prior history of Echocardiogram examinations, most                 recent 12/02/2014. CHF; Risk Factors:Hypertension, Diabetes and                 Dyslipidemia.  Sonographer:    Wenda Low Referring Phys: [id number] ASIA B [place]  1. Left ventricular ejection fraction, by estimation, is 50 to 55%. The left ventricle has low normal function. The left ventricle has no regional wall motion abnormalities. There is severe left ventricular hypertrophy. Left ventricular diastolic parameters are indeterminate.  2. The ventricular septum is flattened in diastole and systole suggesting RV pressure and volume overload. . Right ventricular systolic function is moderately reduced. The right ventricular size is severely enlarged. There is severely elevated pulmonary  artery systolic pressure.  3. Right atrial size was  severely dilated.  4. A small pericardial effusion is present. The pericardial effusion is circumferential.  5. The mitral valve is normal in structure. No evidence of mitral valve regurgitation. No evidence of mitral stenosis.  6. The tricuspid valve is abnormal. Tricuspid valve regurgitation is moderate.  7. The aortic valve is tricuspid. There is mild calcification of the aortic valve. There is mild thickening of the aortic valve. Aortic valve regurgitation is mild. No aortic stenosis is present.  8. The inferior vena cava is normal in size with <50% respiratory variability, suggesting right atrial pressure of 8 mmHg.  9. Severe left ventricular hypertrophy with somewhat of a speckled appearance, diffuse valve thickening, pericardial effusion. Findings may suggest an infiltrative process such as cardiac amyloidosis, consider outpatient cardiac MRI. FINDINGS  Left Ventricle: Left ventricular ejection fraction, by estimation, is 50 to 55%. The left ventricle has low normal function. The left ventricle has no regional wall motion abnormalities. The left ventricular internal cavity size was normal in size. There is severe left ventricular hypertrophy. Left ventricular diastolic parameters are indeterminate. Right Ventricle: The ventricular septum is flattened in diastole and systole suggesting RV pressure and volume overload. The right ventricular size is severely enlarged. Right vetricular wall thickness was not assessed. Right ventricular systolic function is moderately reduced. There is severely elevated pulmonary artery systolic pressure. The tricuspid regurgitant velocity is 4.06 m/s, and with an assumed right atrial pressure of 8 mmHg, the estimated right ventricular systolic pressure is [id number]. Left Atrium: Left atrial size was normal in size. Right Atrium: Right atrial size was severely dilated. Pericardium: A small pericardial effusion is present. The pericardial effusion is circumferential. Mitral Valve:  The mitral valve is normal in structure. There is mild thickening of the mitral valve leaflet(s). There is mild calcification of the mitral valve leaflet(s). Mild mitral annular calcification. No evidence of mitral valve regurgitation. No evidence of mitral valve stenosis. MV peak gradient, 5.8 mmHg. The mean mitral valve gradient is 2.0 mmHg. Tricuspid Valve: The tricuspid valve is abnormal. Tricuspid valve regurgitation is moderate . No evidence of tricuspid stenosis. Aortic Valve: The aortic valve is tricuspid. There is mild calcification of the aortic valve. There is mild thickening of the aortic valve. There is mild aortic valve annular calcification. Aortic valve regurgitation is mild. No aortic stenosis is present. Aortic valve mean gradient measures 3.0 mmHg. Aortic valve  peak gradient measures 5.1 mmHg. Aortic valve area, by VTI measures 2.66 cm. Pulmonic Valve: The pulmonic valve was not well visualized. Pulmonic valve regurgitation is mild. No evidence of pulmonic stenosis. Aorta: The aortic root is normal in size and structure. Venous: The inferior vena cava is normal in size with less than 50% respiratory variability, suggesting right atrial pressure of 8 mmHg. IAS/Shunts: No atrial level shunt detected by color flow Doppler.  LEFT VENTRICLE PLAX 2D LVIDd:         3.19 cm     Diastology LVIDs:         2.24 cm     LV e' medial:    4.03 cm/s LV PW:         1.70 cm     LV E/e' medial:  11.4 LV IVS:        1.93 cm     LV e' lateral:   9.29 cm/s LVOT diam:     2.00 cm     LV E/e' lateral: 4.9 LV SV:         68 LV SV Index:   35 LVOT Area:     3.14 cm  LV Volumes (MOD) LV vol d, MOD A2C: 45.4 ml LV vol d, MOD A4C: 70.2 ml LV vol s, MOD A2C: 30.1 ml LV vol s, MOD A4C: 32.1 ml LV SV MOD A2C:     15.3 ml LV SV MOD A4C:     70.2 ml LV SV MOD BP:      28.2 ml RIGHT VENTRICLE RV Basal diam:  3.63 cm RV Mid diam:    4.32 cm RV S prime:     7.45 cm/s TAPSE (M-mode): 1.5 cm LEFT ATRIUM             Index       RIGHT  ATRIUM           Index LA diam:        4.60 cm 2.36 cm/m  RA Area:     28.60 cm LA Vol (A2C):   69.7 ml 35.73 ml/m RA Volume:   105.00 ml 53.82 ml/m LA Vol (A4C):   50.5 ml 25.89 ml/m LA Biplane Vol: 59.4 ml 30.45 ml/m  AORTIC VALVE AV Area (Vmax):    2.56 cm AV Area (Vmean):   2.34 cm AV Area (VTI):     2.66 cm AV Vmax:           113.00 cm/s AV Vmean:          78.500 cm/s AV VTI:            0.257 m AV Peak Grad:      5.1 mmHg AV Mean Grad:      3.0 mmHg LVOT Vmax:         92.00 cm/s LVOT Vmean:        58.500 cm/s LVOT VTI:          0.218 m LVOT/AV VTI ratio: 0.85  AORTA Ao Root diam: 3.20 cm Ao Asc diam:  3.30 cm MITRAL VALVE                TRICUSPID VALVE MV Area (PHT): 3.15 cm     TR Peak grad:   65.9 mmHg MV Area VTI:   2.17 cm     TR Vmax:        406.00 cm/s MV Peak grad:  5.8 mmHg MV Mean grad:  2.0 mmHg     SHUNTS  MV Vmax:       1.20 m/s     Systemic VTI:  0.22 m MV Vmean:      57.4 cm/s    Systemic Diam: 2.00 cm MV Decel Time: 241 msec MV E velocity: 45.80 cm/s MV A velocity: 113.00 cm/s MV E/A ratio:  0.41 Carlyle Dolly MD Electronically signed by Carlyle Dolly MD Signature Date/Time: 07/20/2021/3:46:24 PM    Final     Orson Eva, DO  [facility]  If 7PM-7AM, please contact night-coverage [email or web address] Password [facility] 07/20/2021, 5:53 PM   LOS: 0 days

## 2021-07-20 NOTE — H&P (Signed)
She could be                                                                             TRH H&P    Patient Demographics:    Rachel Copeland, is a 87 y.o. female  MRN: [id number]  DOB - 1929-04-14  Admit Date - 07/19/2021  Referring MD/NP/PA: Rancour  Outpatient Primary MD for the patient is Lucianne Lei, MD  Patient coming from: Home  Chief complaint- dyspnea   HPI:    Rachel Copeland  is a 87 y.o. female, with history of CHF, diabetes mellitus, hyperlipidemia, hypertension presents the ED with a chief complaint of dyspnea starting 2 weeks ago.  Patient reports that its been gradual in onset and progressively worsening.  She had associated cough as well.  She thought was just a chest cold so she tried to ignore it.  As the dyspnea got worse to do anything around the house as it was worse with exertion.  She denies any dizziness and palpitations or chest pain.  She admits to fatigue.  Patient reports she is never had a blood clot before.  Nobody in her family had a blood clot before.  Patient reports that she has never had a diagnosis of cancer.  She has not recently been on any long trips or immobile.  She has not had any hemoptysis.  She does report swelling greater in her left leg than her right.  Patient does not smoke, does not drink, does not use illicit drugs.  Patient is vaccinated for COVID x1 booster, and is full code.  In the ED Patient is afebrile, stable heart rate, tachypneic to 33, blood pressure 158/68, satting 92% on 4 L nasal cannula No leukocytosis, hemoglobin 11.9, platelets 153 Chemistry panel is unremarkable BNP is elevated at 1275 Troponins elevated at 21 and normalized to 18 Negative respiratory panel CTA shows large bilateral PE without heart strain.  Left lower base atelectasis versus infiltrate versus developing infarct.  Cardiomegaly. Chest x-ray shows stable cardiomegaly with mild mid right lung and left basilar atelectasis and/or infiltrate ED did  start antibiotics -however these would not be continued as patient has been afebrile and has no leukocytosis, less likely infiltrate Pulm was consulted and recommended heparin and echo in a.m.     Review of systems:    In addition to the HPI above,  No Fever-chills, No Headache, No changes with Vision or hearing, No problems swallowing food or Liquids, No Chest pain, admits to cough and dyspnea No Abdominal pain, No Nausea or Vomiting, bowel movements are regular, No Blood in stool or Urine, No dysuria, No new skin rashes or bruises, No new joints pains-aches,  No new weakness, tingling, numbness in any extremity, No recent weight gain or loss, No polyuria, polydypsia or polyphagia, No significant Mental Stressors.  All other systems reviewed and are negative.    Past History of the following :    Past Medical History:  Diagnosis Date   CHF (congestive heart failure) ([place])    Diabetes mellitus    no medications   Hyperlipidemia    Hypertension       Past Surgical History:  Procedure Laterality Date  NO PAST SURGERIES        Social History:      Social History   Tobacco Use   Smoking status: Never   Smokeless tobacco: Never  Substance Use Topics   Alcohol use: No       Family History :     Family History  Problem Relation Age of Onset   Colon cancer Mother    Colon polyps Mother    Diabetes Mellitus II Brother    Diabetes Mellitus II Daughter    Breast cancer Neg Hx    Stomach cancer Neg Hx       Home Medications:   Prior to Admission medications   Medication Sig Start Date End Date Taking? Authorizing Provider  amLODipine (NORVASC) 10 MG tablet Take 10 mg by mouth daily.    [provider]  Ascorbic Acid (VITAMIN C) 1000 MG tablet Take 1,000 mg by mouth every morning.    [provider]  aspirin 81 MG tablet Take 81 mg by mouth daily.    [provider]  calcium-vitamin D (OSCAL WITH D) 500-200 MG-UNIT per  tablet Take 1 tablet by mouth every morning.    [provider]  carvedilol (COREG) 25 MG tablet Take 25 mg by mouth 2 (two) times daily with a meal.    [provider]  fish oil-omega-3 fatty acids 1000 MG capsule Take 1 g by mouth every morning.    [provider]  furosemide (LASIX) 40 MG tablet Take 1 tablet (40 mg total) by mouth every morning. 12/06/14   Robbie Lis, MD  Multiple Vitamins-Minerals (MULTIVITAMIN ADULT PO) Take by mouth.    [provider]  potassium chloride (K-DUR) 10 MEQ tablet Take 1 tablet (10 mEq total) by mouth daily. Patient not taking: Reported on 04/07/2019 12/06/14   Robbie Lis, MD  rosuvastatin (CRESTOR) 10 MG tablet TK 1 T PO QD 03/21/19   [provider]  sitaGLIPtin (JANUVIA) 100 MG tablet Take 100 mg by mouth daily.    [provider]  valsartan-hydrochlorothiazide (DIOVAN-HCT) 320-25 MG per tablet Take 1 tablet by mouth daily.    [provider]     Allergies:     Allergies  Allergen Reactions   Sitagliptin Phosphate     REACTION: Finger and hand tingling RUQ pain     Physical Exam:   Vitals  Blood pressure (!) 150/62, pulse 60, temperature 98 F (36.7 C), temperature source Oral, resp. rate 20, height '5\' 5"'$  (1.651 m), weight 87.8 kg, SpO2 90 %.  1.  General: Patient lying supine in bed,  no acute distress   2. Psychiatric: Alert and oriented x 3, mood and behavior normal for situation, pleasant and cooperative with exam   3. Neurologic: Speech and language are normal, face is symmetric, moves all 4 extremities voluntarily, at baseline without acute deficits on limited exam   4. HEENMT:  Head is atraumatic, normocephalic, pupils reactive to light, neck is supple, trachea is midline, mucous membranes are moist   5. Respiratory : Lungs are clear to auscultation bilaterally without wheezing, rhonchi, rales, no cyanosis, slightly diminished in the lower lung fields, slight  increase in work of breathing with use of accessory muscles, but speaking in full sentences, 4 L nasal cannula in place  6. Cardiovascular : Heart rate normal, rhythm is regular, no murmurs, rubs or gallops, asymmetric left lower extremity edema, peripheral pulses palpated   7. Gastrointestinal:  Abdomen is soft, nondistended, nontender  to palpation bowel sounds active, no masses or organomegaly palpated   8. Skin:  Skin is warm, dry and intact without rashes, acute lesions, or ulcers on limited exam   9.Musculoskeletal:  No acute deformities or trauma, no asymmetry in tone, asymmetric left lower extremity edema, peripheral pulses palpated, no tenderness to palpation in the extremities     Data Review:    CBC Recent Labs  Lab 07/19/21 1835  WBC 5.6  HGB 11.9*  HCT 37.0  PLT 153  MCV 81.1  MCH 26.1  MCHC 32.2  RDW 17.8*   ------------------------------------------------------------------------------------------------------------------  Results for orders placed or performed during the hospital encounter of 07/19/21 (from the past 48 hour(s))  CBC     Status: Abnormal   Collection Time: 07/19/21  6:35 PM  Result Value Ref Range   WBC 5.6 4.0 - 10.5 K/uL   RBC 4.56 3.87 - 5.11 MIL/uL   Hemoglobin 11.9 (L) 12.0 - 15.0 g/dL   HCT 37.0 36.0 - 46.0 %   MCV 81.1 80.0 - 100.0 fL   MCH 26.1 26.0 - 34.0 pg   MCHC 32.2 30.0 - 36.0 g/dL   RDW 17.8 (H) 11.5 - 15.5 %   Platelets 153 150 - 400 K/uL   nRBC 0.0 0.0 - 0.2 %    Comment: Performed at [facility], [address] [place], [place] [id number]  Basic metabolic panel     Status: Abnormal   Collection Time: 07/19/21  6:35 PM  Result Value Ref Range   Sodium 136 135 - 145 mmol/L   Potassium 3.5 3.5 - 5.1 mmol/L   Chloride 105 98 - 111 mmol/L   CO2 24 22 - 32 mmol/L   Glucose, Bld 155 (H) 70 - 99 mg/dL    Comment: Glucose reference range applies only to samples taken after fasting for at least 8 hours.   BUN 20 8 - 23  mg/dL   Creatinine, Ser 1.03 (H) 0.44 - 1.00 mg/dL   Calcium 9.3 8.9 - 10.3 mg/dL   GFR, Estimated 52 (L) >60 mL/min    Comment: (NOTE) Calculated using the CKD-EPI Creatinine Equation (2021)    Anion gap 7 5 - 15    Comment: Performed at [facility], [address], [place], [place] [address]  Troponin I (High Sensitivity)     Status: Abnormal   Collection Time: 07/19/21  6:35 PM  Result Value Ref Range   Troponin I (High Sensitivity) 21 (H) <18 ng/L    Comment: (NOTE) Elevated high sensitivity troponin I (hsTnI) values and significant  changes across serial measurements may suggest ACS but many other  chronic and acute conditions are known to elevate hsTnI results.  Refer to the "Links" section for chest pain algorithms and additional  guidance. Performed at [facility], [address], [place], [place] [address]   Brain natriuretic peptide     Status: Abnormal   Collection Time: 07/19/21  6:35 PM  Result Value Ref Range   B Natriuretic Peptide 1,275.0 (H) 0.0 - 100.0 pg/mL    Comment: Performed at [facility], [address], [place], [place] [address]  Troponin I (High Sensitivity)     Status: Abnormal   Collection Time: 07/19/21  8:21 PM  Result Value Ref Range   Troponin I (High Sensitivity) 18 (H) <18 ng/L    Comment: (NOTE) Elevated high sensitivity troponin I (hsTnI) values and significant  changes across serial measurements may suggest ACS but many other  chronic and acute  conditions are known to elevate hsTnI results.  Refer to the "Links" section for chest pain algorithms and additional  guidance. Performed at [facility], [address], [place], [place] [address]   Resp Panel by RT-PCR (Flu A&B, Covid) Nasopharyngeal Swab     Status: None   Collection Time: 07/19/21  8:58 PM   Specimen: Nasopharyngeal Swab; Nasopharyngeal(NP) swabs in vial transport medium  Result Value Ref Range   SARS Coronavirus 2 by RT PCR NEGATIVE NEGATIVE    Comment: (NOTE) SARS-CoV-2  target nucleic acids are NOT DETECTED.  The SARS-CoV-2 RNA is generally detectable in upper respiratory specimens during the acute phase of infection. The lowest concentration of SARS-CoV-2 viral copies this assay can detect is 138 copies/mL. A negative result does not preclude SARS-Cov-2 infection and should not be used as the sole basis for treatment or other patient management decisions. A negative result may occur with  improper specimen collection/handling, submission of specimen other than nasopharyngeal swab, presence of viral mutation(s) within the areas targeted by this assay, and inadequate number of viral copies(<138 copies/mL). A negative result must be combined with clinical observations, patient history, and epidemiological information. The expected result is Negative.  Fact Sheet for Patients:  EntrepreneurPulse.com.au  Fact Sheet for Healthcare Providers:  IncredibleEmployment.be  This test is no t yet approved or cleared by the [place] FDA and  has been authorized for detection and/or diagnosis of SARS-CoV-2 by FDA under an Emergency Use Authorization (EUA). This EUA will remain  in effect (meaning this test can be used) for the duration of the COVID-19 declaration under Section 564(b)(1) of the Act, 21 U.S.C.section 360bbb-3(b)(1), unless the authorization is terminated  or revoked sooner.       Influenza A by PCR NEGATIVE NEGATIVE   Influenza B by PCR NEGATIVE NEGATIVE    Comment: (NOTE) The Xpert Xpress SARS-CoV-2/FLU/RSV plus assay is intended as an aid in the diagnosis of influenza from Nasopharyngeal swab specimens and should not be used as a sole basis for treatment. Nasal washings and aspirates are unacceptable for Xpert Xpress SARS-CoV-2/FLU/RSV testing.  Fact Sheet for Patients: EntrepreneurPulse.com.au  Fact Sheet for Healthcare Providers: IncredibleEmployment.be  This  test is not yet approved or cleared by the [place] FDA and has been authorized for detection and/or diagnosis of SARS-CoV-2 by FDA under an Emergency Use Authorization (EUA). This EUA will remain in effect (meaning this test can be used) for the duration of the COVID-19 declaration under Section 564(b)(1) of the Act, 21 U.S.C. section 360bbb-3(b)(1), unless the authorization is terminated or revoked.  Performed at [facility], [address] [place], [place] [address]     Chemistries  Recent Labs  Lab 07/19/21 1835  NA 136  K 3.5  CL 105  CO2 24  GLUCOSE 155*  BUN 20  CREATININE 1.03*  CALCIUM 9.3   ------------------------------------------------------------------------------------------------------------------  ------------------------------------------------------------------------------------------------------------------ GFR: Estimated Creatinine Clearance: 40.5 mL/min (A) (by C-G formula based on SCr of 1.03 mg/dL (H)). Liver Function Tests: No results for input(s): AST, ALT, ALKPHOS, BILITOT, PROT, ALBUMIN in the last 168 hours. No results for input(s): LIPASE, AMYLASE in the last 168 hours. No results for input(s): AMMONIA in the last 168 hours. Coagulation Profile: No results for input(s): INR, PROTIME in the last 168 hours. Cardiac Enzymes: No results for input(s): CKTOTAL, CKMB, CKMBINDEX, TROPONINI in the last 168 hours. BNP (last 3 results) No results for input(s): PROBNP in the last 8760 hours. HbA1C: No results for input(s): HGBA1C in the  last 72 hours. CBG: No results for input(s): GLUCAP in the last 168 hours. Lipid Profile: No results for input(s): CHOL, HDL, LDLCALC, TRIG, CHOLHDL, LDLDIRECT in the last 72 hours. Thyroid Function Tests: No results for input(s): TSH, T4TOTAL, FREET4, T3FREE, THYROIDAB in the last 72 hours. Anemia Panel: No results for input(s): VITAMINB12, FOLATE, FERRITIN, TIBC, IRON, RETICCTPCT in the last 72  hours.  --------------------------------------------------------------------------------------------------------------- Urine analysis:    Component Value Date/Time   COLORURINE YELLOW 11/29/2018 [place] 11/29/2018 0414   LABSPEC 1.008 11/29/2018 0414   PHURINE 5.0 11/29/2018 0414   GLUCOSEU NEGATIVE 11/29/2018 0414   HGBUR SMALL (A) 11/29/2018 0414   HGBUR negative 04/22/2008 1009   BILIRUBINUR NEGATIVE 11/29/2018 0414   KETONESUR NEGATIVE 11/29/2018 0414   PROTEINUR NEGATIVE 11/29/2018 0414   UROBILINOGEN 0.2 12/01/2014 2103   NITRITE NEGATIVE 11/29/2018 0414   LEUKOCYTESUR TRACE (A) 11/29/2018 0414      Imaging Results:    DG Chest 2 View  Result Date: 07/19/2021 CLINICAL DATA:  Cough and shortness of breath x1 week. EXAM: CHEST - 2 VIEW COMPARISON:  November 28, 2018 FINDINGS: Mild atelectasis and/or early infiltrate is seen within the mid right lung and left lung base. There is no evidence of a pleural effusion or pneumothorax. The cardiac silhouette is moderately enlarged and unchanged in size. There is stable prominence of the right hilar pulmonary vasculature. Marked severity calcification of the aortic arch is seen with marked severity tortuosity of the descending thoracic aorta. The visualized skeletal structures are unremarkable. IMPRESSION: Stable cardiomegaly with mild mid right lung and left basilar atelectasis and/or infiltrate. Electronically Signed   By: Virgina Norfolk M.D.   On: 07/19/2021 19:18   CT Angio Chest PE W and/or Wo Contrast  Result Date: 07/20/2021 CLINICAL DATA:  87 year old female with concern for pulmonary embolism. EXAM: CT ANGIOGRAPHY CHEST WITH CONTRAST TECHNIQUE: Multidetector CT imaging of the chest was performed using the standard protocol during bolus administration of intravenous contrast. Multiplanar CT image reconstructions and MIPs were obtained to evaluate the vascular anatomy. CONTRAST:  191m OMNIPAQUE IOHEXOL 350 MG/ML  SOLN COMPARISON:  Chest radiograph dated 07/19/2021. FINDINGS: Cardiovascular: There is mild cardiomegaly. There is dilatation of the right atrium with retrograde flow of contrast from the right atrium into the IVC indicative right heart dysfunction. Correlation with echocardiogram recommended. No pericardial effusion. There is coronary vascular calcification. Moderate atherosclerotic calcification of the thoracic aorta. No aneurysmal dilatation. Evaluation of the aorta is limited due to suboptimal opacification and timing of the contrast. There is a left-sided aortic arch with aberrant right subclavian artery anatomy. Large bilateral pulmonary artery emboli involving the lobar branches extending into the segmental and subsegmental branches. There is large clot burden in the right lower lobe. No CT evidence of right heart straining. The right ventricular lumen measures approximately 4 cm and the left ventricular lumen measures 5.4 cm for a ratio of 0.7. Mediastinum/Nodes: No hilar or mediastinal adenopathy. The esophagus is grossly unremarkable. No mediastinal fluid collection. Lungs/Pleura: Patchy area of ground-glass opacity involving the left lung base may represent atelectasis or infiltrate 6. Developing infarct is not excluded. There is no pleural effusion pneumothorax. The central airways are patent. Upper Abdomen: No acute abnormality. Musculoskeletal: Degenerative changes of the spine and osteopenia. No acute osseous pathology. Review of the MIP images confirms the above findings. IMPRESSION: 1. Large bilateral pulmonary artery emboli involving the lobar branches extending into the segmental and subsegmental branches. No CT evidence of right heart  straining. 2. Left lung base atelectasis versus infiltrate. Developing infarct is not excluded. 3. Mild cardiomegaly with evidence of right heart dysfunction. Correlation with echocardiogram recommended. 4. Left-sided aortic arch with aberrant right subclavian  artery anatomy. 5. Aortic Atherosclerosis (ICD10-I70.0). These results were called by telephone at the time of interpretation on 07/19/2021 at 11:59 pm to provider [facility] , who verbally acknowledged these results. Electronically Signed   By: Anner Crete M.D.   On: 07/20/2021 00:02    My personal review of EKG: Rhythm NSR, Rate66 /min, QTc 499 ,no Acute ST changes   Assessment & Plan:    Active Problems:   Pulmonary embolism (HCC)   Acute hypoxic respiratory failure Oxygen sats down to 80%, requiring 4 L nasal cannula to maintain low to mid 90 O2 sats Secondary to acute PE See treatment below Acute pulmonary emboli Continue heparin drip Echo to assess for right heart strain in the a.m. Bilateral DVT ultrasound studies to assess for clot burden Continue oxygen supplementation Monitor on telemetry Hypertension Continue Coreg and Diovan Diabetes mellitus type 2 Hold home medications, sliding scale coverage, monitor CBGs, carb modified heart healthy diet Hyperlipidemia Continue statin CHF Continue aspirin, Coreg, statin   DVT Prophylaxis-Heparin- SCDs   AM Labs Ordered, also please review Full Orders  Family Communication:  no family at bedside  Code Status: Full  Admission status: Inpatient :The appropriate admission status for this patient is INPATIENT. Inpatient status is judged to be reasonable and necessary in order to provide the required intensity of service to ensure the patient's safety. The patient's presenting symptoms, physical exam findings, and initial radiographic and laboratory data in the context of their chronic comorbidities is felt to place them at high risk for further clinical deterioration. Furthermore, it is not anticipated that the patient will be medically stable for discharge from the hospital within 2 midnights of admission. The following factors support the admission status of inpatient.     The patient's presenting symptoms include  dyspnea. The worrisome physical exam findings include hypoxia. The initial radiographic and laboratory data are worrisome because of pulmonary embolism. The chronic co-morbidities include hypertension, diabetes mellitus, hyperlipidemia.       * I certify that at the point of admission it is my clinical judgment that the patient will require inpatient hospital care spanning beyond 2 midnights from the point of admission due to high intensity of service, high risk for further deterioration and high frequency of surveillance required.*  Time spent in minutes : [place]

## 2021-07-20 NOTE — Progress Notes (Signed)
ANTICOAGULATION CONSULT NOTE - Initial Consult  Pharmacy Consult for heparin Indication: pulmonary embolus  Allergies  Allergen Reactions   Sitagliptin Phosphate     REACTION: Finger and hand tingling RUQ pain    Patient Measurements: Height: '5\' 5"'$  (165.1 cm) Weight: 87.8 kg (193 lb 9 oz) IBW/kg (Calculated) : 57 Heparin Dosing Weight: 76.2 kg  Vital Signs: Temp: 98 F (36.7 C) (07/26 1731) Temp Source: Oral (07/26 1731) BP: 158/81 (07/27 0000) Pulse Rate: 74 (07/27 0007)  Labs: Recent Labs    07/19/21 1835 07/19/21 2021  HGB 11.9*  --   HCT 37.0  --   PLT 153  --   CREATININE 1.03*  --   TROPONINIHS 21* 18*    Estimated Creatinine Clearance: 40.5 mL/min (A) (by C-G formula based on SCr of 1.03 mg/dL (H)).   Medical History: Past Medical History:  Diagnosis Date   CHF (congestive heart failure) ([place])    Diabetes mellitus    no medications   Hyperlipidemia    Hypertension     Medications:  See medication history  Assessment: 87 yo lady with PE to start heparin.  She was not on anticoagulation PTA.  Hg 11.9, PTLC 153 Goal of Therapy:  Heparin level 0.3-0.7 units/ml Monitor platelets by anticoagulation protocol: Yes   Plan:  Heparin 4000 unit bolus and drip at 1350 units/hr Check heparin level 6-8 hours after start Daily HL and CBC Monitor for bleeding complications  Anairis Knick Poteet 07/20/2021,12:37 AM

## 2021-07-20 NOTE — Progress Notes (Signed)
ANTICOAGULATION CONSULT NOTE -   Pharmacy Consult for heparin Indication: pulmonary embolus  Allergies  Allergen Reactions   Sitagliptin Phosphate     REACTION: Finger and hand tingling RUQ pain    Patient Measurements: Height: '5\' 5"'$  (165.1 cm) Weight: 87.8 kg (193 lb 9 oz) IBW/kg (Calculated) : 57 Heparin Dosing Weight: 76.2 kg  Vital Signs: BP: 150/62 (07/27 0530) Pulse Rate: 60 (07/27 0530)  Labs: Recent Labs    07/19/21 1835 07/19/21 2021 07/20/21 0745  HGB 11.9*  --   --   HCT 37.0  --   --   PLT 153  --   --   HEPARINUNFRC  --   --  0.70  CREATININE 1.03*  --   --   TROPONINIHS 21* 18*  --      Estimated Creatinine Clearance: 40.5 mL/min (A) (by C-G formula based on SCr of 1.03 mg/dL (H)).   Medical History: Past Medical History:  Diagnosis Date   CHF (congestive heart failure) ([place])    Diabetes mellitus    no medications   Hyperlipidemia    Hypertension      Assessment: 87 yo lady with PE to start heparin.  She was not on anticoagulation PTA.  Hg 11.9, PTLC 153  Goal of Therapy:  Heparin level 0.3-0.7 units/ml Monitor platelets by anticoagulation protocol: Yes   Plan:  Heparin infusion at 1300 units/hr. Check heparin level 8 hours  Daily HL and CBC Monitor for bleeding complications  Margot Ables, PharmD Clinical Pharmacist 07/20/2021 8:33 AM

## 2021-07-20 NOTE — ED Notes (Signed)
Changed out urine canister ?

## 2021-07-20 NOTE — Progress Notes (Signed)
ANTICOAGULATION CONSULT NOTE -   Pharmacy Consult for heparin Indication: pulmonary embolus  Allergies  Allergen Reactions   Sitagliptin Phosphate     REACTION: Finger and hand tingling RUQ pain    Patient Measurements: Height: '5\' 5"'$  (165.1 cm) Weight: 87.8 kg (193 lb 9 oz) IBW/kg (Calculated) : 57 Heparin Dosing Weight: 76.2 kg  Vital Signs: BP: 100/67 (07/27 1900) Pulse Rate: 61 (07/27 1900)  Labs: Recent Labs    07/19/21 1835 07/19/21 2021 07/20/21 0745 07/20/21 1648  HGB 11.9*  --   --   --   HCT 37.0  --   --   --   PLT 153  --   --   --   HEPARINUNFRC  --   --  0.70 0.69  CREATININE 1.03*  --   --   --   TROPONINIHS 21* 18*  --   --      Estimated Creatinine Clearance: 40.5 mL/min (A) (by C-G formula based on SCr of 1.03 mg/dL (H)).   Medical History: Past Medical History:  Diagnosis Date   CHF (congestive heart failure) ([place])    Diabetes mellitus    no medications   Hyperlipidemia    Hypertension      Assessment: 87 yo lady with PE to start heparin.  She was not on anticoagulation PTA.  Heparin level this evening remains therapeutic at 0.69 on 1300 units/hr. No infusion problems or bleeding noted.  Goal of Therapy:  Heparin level 0.3-0.7 units/ml Monitor platelets by anticoagulation protocol: Yes   Plan:  Continue Heparin infusion at 1300 units/hr. Check anti-Xa level daily while on heparin Continue to monitor H&H and platelets   Thank you for allowing [place] to participate in this patients care. Jens Som, PharmD 07/20/2021 9:04 PM  Please check [email or web address] for unit-specific pharmacy phone numbers.

## 2021-07-21 DIAGNOSIS — I2609 Other pulmonary embolism with acute cor pulmonale: Secondary | ICD-10-CM

## 2021-07-21 DIAGNOSIS — E876 Hypokalemia: Secondary | ICD-10-CM

## 2021-07-21 LAB — HEPARIN LEVEL (UNFRACTIONATED)
Heparin Unfractionated: 0.96 IU/mL — ABNORMAL HIGH (ref 0.30–0.70)
Heparin Unfractionated: 0.94 IU/mL — ABNORMAL HIGH (ref 0.30–0.70)

## 2021-07-21 LAB — CBC WITH DIFFERENTIAL/PLATELET
Abs Immature Granulocytes: 0.02 10*3/uL (ref 0.00–0.07)
Basophils Absolute: 0.1 10*3/uL (ref 0.0–0.1)
Basophils Relative: 1 %
Eosinophils Absolute: 0.3 10*3/uL (ref 0.0–0.5)
Eosinophils Relative: 8 %
HCT: 36.6 % (ref 36.0–46.0)
Hemoglobin: 11.6 g/dL — ABNORMAL LOW (ref 12.0–15.0)
Immature Granulocytes: 1 %
Lymphocytes Relative: 26 %
Lymphs Abs: 1.1 10*3/uL (ref 0.7–4.0)
MCH: 26 pg (ref 26.0–34.0)
MCHC: 31.7 g/dL (ref 30.0–36.0)
MCV: 82.1 fL (ref 80.0–100.0)
Monocytes Absolute: 0.4 10*3/uL (ref 0.1–1.0)
Monocytes Relative: 9 %
Neutro Abs: 2.4 10*3/uL (ref 1.7–7.7)
Neutrophils Relative %: 55 %
Platelets: 134 10*3/uL — ABNORMAL LOW (ref 150–400)
RBC: 4.46 MIL/uL (ref 3.87–5.11)
RDW: 17.9 % — ABNORMAL HIGH (ref 11.5–15.5)
WBC: 4.4 10*3/uL (ref 4.0–10.5)
nRBC: 0 % (ref 0.0–0.2)

## 2021-07-21 LAB — MAGNESIUM: Magnesium: 2.1 mg/dL (ref 1.7–2.4)

## 2021-07-21 LAB — COMPREHENSIVE METABOLIC PANEL
ALT: 33 U/L (ref 0–44)
AST: 20 U/L (ref 15–41)
Albumin: 2.8 g/dL — ABNORMAL LOW (ref 3.5–5.0)
Alkaline Phosphatase: 73 U/L (ref 38–126)
Anion gap: 8 (ref 5–15)
BUN: 18 mg/dL (ref 8–23)
CO2: 23 mmol/L (ref 22–32)
Calcium: 9 mg/dL (ref 8.9–10.3)
Chloride: 110 mmol/L (ref 98–111)
Creatinine, Ser: 0.95 mg/dL (ref 0.44–1.00)
GFR, Estimated: 57 mL/min — ABNORMAL LOW (ref >60)
Glucose, Bld: 103 mg/dL — ABNORMAL HIGH (ref 70–99)
Potassium: 3.1 mmol/L — ABNORMAL LOW (ref 3.5–5.1)
Sodium: 141 mmol/L (ref 135–145)
Total Bilirubin: 0.5 mg/dL (ref 0.3–1.2)
Total Protein: 5.9 g/dL — ABNORMAL LOW (ref 6.5–8.1)

## 2021-07-21 LAB — GLUCOSE, CAPILLARY
Glucose-Capillary: 142 mg/dL — ABNORMAL HIGH (ref 70–99)
Glucose-Capillary: 155 mg/dL — ABNORMAL HIGH (ref 70–99)
Glucose-Capillary: 140 mg/dL — ABNORMAL HIGH (ref 70–99)
Glucose-Capillary: 148 mg/dL — ABNORMAL HIGH (ref 70–99)

## 2021-07-21 MED ORDER — POTASSIUM CHLORIDE CRYS ER 20 MEQ PO TBCR
40.0000 meq | EXTENDED_RELEASE_TABLET | Freq: Once | ORAL | Status: AC
  Administered 2021-07-21: 40 meq via ORAL
  Filled 2021-07-21: qty 2

## 2021-07-21 NOTE — Progress Notes (Signed)
ANTICOAGULATION CONSULT NOTE -   Pharmacy Consult for heparin Indication: pulmonary embolus  Allergies  Allergen Reactions   Sitagliptin Phosphate     REACTION: Finger and hand tingling RUQ pain    Patient Measurements: Height: '5\' 5"'$  (165.1 cm) Weight: 87.8 kg (193 lb 9 oz) IBW/kg (Calculated) : 57 Heparin Dosing Weight: 76.2 kg  Vital Signs: Temp: 97.6 F (36.4 C) (07/28 0800) Temp Source: Oral (07/28 0800) BP: 163/63 (07/28 0800) Pulse Rate: 61 (07/28 0800)  Labs: Recent Labs    07/19/21 1835 07/19/21 2021 07/20/21 0745 07/20/21 1648 07/21/21 0555  HGB 11.9*  --   --   --  11.6*  HCT 37.0  --   --   --  36.6  PLT 153  --   --   --  134*  HEPARINUNFRC  --   --  0.70 0.69 0.96*  CREATININE 1.03*  --   --   --  0.95  TROPONINIHS 21* 18*  --   --   --      Estimated Creatinine Clearance: 43.9 mL/min (by C-G formula based on SCr of 0.95 mg/dL).   Medical History: Past Medical History:  Diagnosis Date   CHF (congestive heart failure) ([place])    Diabetes mellitus    no medications   Hyperlipidemia    Hypertension      Assessment: 87 yo lady with PE to start heparin.  She was not on anticoagulation PTA.    HL 0.96- supratherapeutic  Goal of Therapy:  Heparin level 0.3-0.7 units/ml Monitor platelets by anticoagulation protocol: Yes   Plan:  Decrease Heparin infusion to 1150 units/hr. Check anti-Xa level daily while on heparin Continue to monitor H&H and platelets   Thank you for allowing [place] to participate in this patients care.  Margot Ables, PharmD Clinical Pharmacist 07/21/2021 9:10 AM

## 2021-07-21 NOTE — Progress Notes (Signed)
ANTICOAGULATION CONSULT NOTE -   Pharmacy Consult for heparin Indication: pulmonary embolus  Allergies  Allergen Reactions   Sitagliptin Phosphate     REACTION: Finger and hand tingling RUQ pain    Patient Measurements: Height: '5\' 5"'$  (165.1 cm) Weight: 87.8 kg (193 lb 9 oz) IBW/kg (Calculated) : 57 Heparin Dosing Weight: 76.2 kg  Vital Signs: Temp: 97.6 F (36.4 C) (07/28 1255) Temp Source: Oral (07/28 1255) BP: 131/59 (07/28 1255) Pulse Rate: 55 (07/28 1255)  Labs: Recent Labs    07/19/21 1835 07/19/21 2021 07/20/21 0745 07/20/21 1648 07/21/21 0555 07/21/21 1757  HGB 11.9*  --   --   --  11.6*  --   HCT 37.0  --   --   --  36.6  --   PLT 153  --   --   --  134*  --   HEPARINUNFRC  --   --    < > 0.69 0.96* 0.94*  CREATININE 1.03*  --   --   --  0.95  --   TROPONINIHS 21* 18*  --   --   --   --    < > = values in this interval not displayed.     Estimated Creatinine Clearance: 43.9 mL/min (by C-G formula based on SCr of 0.95 mg/dL).   Medical History: Past Medical History:  Diagnosis Date   CHF (congestive heart failure) ([place])    Diabetes mellitus    no medications   Hyperlipidemia    Hypertension      Assessment: 87 yo lady with PE to start heparin.  She was not on anticoagulation PTA.    HL 0.94- supratherapeutic  Goal of Therapy:  Heparin level 0.3-0.7 units/ml Monitor platelets by anticoagulation protocol: Yes   Plan:  Decrease Heparin infusion to 950 units/hr. Check anti-Xa level in 8 hours and daily while on heparin Continue to monitor H&H and platelets   Thank you for allowing [place] to participate in this patients care.  Margot Ables, PharmD Clinical Pharmacist 07/21/2021 6:52 PM

## 2021-07-21 NOTE — Plan of Care (Signed)

## 2021-07-21 NOTE — Progress Notes (Addendum)
Initial Nutrition Assessment  DOCUMENTATION CODES:      INTERVENTION:  CHO Modified diet   Snack qhs as desired   NUTRITION DIAGNOSIS:   Inadequate oral intake related to  (respiratory failure) as evidenced by Acute large bilateral PE.  GOAL:  Patient will meet greater than or equal to 90% of their needs (and potassium return within normal limits)  MONITOR:  PO intake, Supplement acceptance, Labs, Weight trends  REASON FOR ASSESSMENT:   Malnutrition Screening Tool    ASSESSMENT: Patient is a 87 yo female who has a hx of DM2, CKD3, presents with shortness of breath. Acute respiratory failure, acute large bilateral pulmonary artery emboli. Chronic CHF, HTN and HLD.  Patient is eating well. Meal intake 100% breakfast the morning and 50-75% lunch. Feeds herself. No history of swallow problem reported. Lives with son and plans to return at discharge.   Weight stable- based on chart review.  Medications reviewed and include: Crestor, Insulin, Norvasc.  Labs: Hypokalemia BMP Latest Ref Rng & Units 07/21/2021 07/19/2021 11/29/2018  Glucose 70 - 99 mg/dL 103(H) 155(H) 162(H)  BUN 8 - 23 mg/dL '18 20 20  '$ Creatinine 0.44 - 1.00 mg/dL 0.95 1.03(H) 1.10(H)  Sodium 135 - 145 mmol/L 141 136 138  Potassium 3.5 - 5.1 mmol/L 3.1(L) 3.5 3.3(L)  Chloride 98 - 111 mmol/L 110 105 104  CO2 22 - 32 mmol/L '23 24 25  '$ Calcium 8.9 - 10.3 mg/dL 9.0 9.3 9.1      NUTRITION - FOCUSED PHYSICAL EXAM: Unable to complete Nutrition-Focused physical exam at this time.    Diet Order:   Diet Order             Diet Carb Modified Fluid consistency: Thin; Room service appropriate? Yes  Diet effective now                   EDUCATION NEEDS:  Not appropriate for education at this time  Skin:  Skin Assessment: Reviewed RN Assessment  Last BM:  prior to admission  Height:   Ht Readings from Last 1 Encounters:  07/19/21 '5\' 5"'$  (1.651 m)    Weight:   Wt Readings from Last 1 Encounters:   07/19/21 87.8 kg    Ideal Body Weight:   57 kg  BMI:  Body mass index is 32.21 kg/m.  Estimated Nutritional Needs:   Kcal:  1600-1800  Protein:  80-85 gr  Fluid:  < 2liters daily   Colman Cater MS,RD,CSG,LDN Contact: AMION

## 2021-07-21 NOTE — Progress Notes (Addendum)
PROGRESS NOTE  Rachel Copeland [id number] DOB: October 03, 1929 DOA: 07/19/2021 PCP: Neale Burly, MD   Brief History:  87 year old female with a history of diabetes mellitus type 2, hyperlipidemia, hypertension, CKD stage III presenting with 2-week history of worsening shortness of breath and coughing with clear yellow sputum.  States that she is primarily short of breath with exertion.  She denies any fevers, chills, chest pain, nausea, vomiting or diarrhea, abdominal pain.  She states that her leg edema is about the same if not a little bit better than usual.  She denies any hemoptysis.  She denies any recent surgery or travels.  She has not had any recent changes in any of her medications.  She has had extensive secondhand exposure to tobacco smoke from her late husband.  She has never smoked herself.  She denies any recent surgeries or trauma. In the emergency department, the patient was afebrile hemodynamically stable with oxygen saturation in the 80s on room air.  Oxygen saturation was 90-92% on 4 L.  CTA chest was positive for pulmonary emboli.  The patient was started on IV heparin.   Assessment/Plan: Acute respiratory failure with hypoxia -Secondary to pulmonary emboli -Currently stable on 4 L>>3L -Patient remains hemodynamically stable -07/19/2021 CTA chest large bilateral pulmonary artery emboli; LLL atelectasis versus infiltrate ?  Infarct; no RV strain noted   Acute pulmonary emboli -07/20/2021 echo EF 50-55%, no WMA, IV septum flattening suggesting RV overload.  Moderate TR -Continue IV heparin -Venous duplex legs--DVT right posterior tibial and left femoral veins -unprovoked by history -check Factor V Leiden -check Lupus Anticoagulant -check prothrombin gene mutation   Diabetes mellitus type 2 -This is diet-controlled -7/27 A1c--7.5 -NovoLog sliding scale for now   Essential hypertension -Holding Diovan HCTZ -Continue amlodipine and carvedilol    Hyperlipidemia -Continue Crestor   CKD stage IIIb -Baseline creatinine 1.0-1.3 -Monitor serial BMP   Chronic diastolic CHF -Appears clinically euvolemic   Hypokalemia -check mag--2.1 -replete       Status is: Inpatient   Remains inpatient appropriate because:Inpatient level of care appropriate due to severity of illness   Dispo: The patient is from: Home              Anticipated d/c is to: Home              Patient currently is not medically stable to d/c.              Difficult to place patient No               Family Communication: daughter updated 7/28   Consultants:  none   Code Status:  FULL   DVT Prophylaxis:  IV Heparin     Procedures: As Listed in Progress Note Above   Antibiotics: None    Subjective: Patient is breathing better, but still remains dyspneic with exertion.  Denies f/c, cp, n,v,d, abd pain  Objective: Vitals:   07/21/21 0023 07/21/21 0422 07/21/21 0800 07/21/21 1255  BP: (!) 146/58 (!) 144/70 (!) 163/63 (!) 131/59  Pulse: (!) 56 (!) 58 61 (!) 55  Resp: '19 19 18 18  '$ Temp: 97.7 F (36.5 C) 97.8 F (36.6 C) 97.6 F (36.4 C) 97.6 F (36.4 C)  TempSrc: Oral Oral Oral Oral  SpO2: 98% 100% 97% 99%  Weight:      Height:        Intake/Output Summary (Last 24 hours) at 07/21/2021 1340 Last  data filed at 07/21/2021 0900 Gross per 24 hour  Intake 240 ml  Output 500 ml  Net -260 ml   Weight change:  Exam:  General:  Pt is alert, follows commands appropriately, not in acute distress HEENT: No icterus, No thrush, No neck mass, [place]/AT Cardiovascular: RRR, S1/S2, no rubs, no gallops Respiratory: bibasilar rales, L>R, no wheeze Abdomen: Soft/+BS, non tender, non distended, no guarding Extremities: trace LE edema, No lymphangitis, No petechiae, No rashes, no synovitis   Data Reviewed: I have personally reviewed following labs and imaging studies Basic Metabolic Panel: Recent Labs  Lab 07/19/21 1835 07/21/21 0555  NA 136  141  K 3.5 3.1*  CL 105 110  CO2 24 23  GLUCOSE 155* 103*  BUN 20 18  CREATININE 1.03* 0.95  CALCIUM 9.3 9.0  MG  --  2.1   Liver Function Tests: Recent Labs  Lab 07/21/21 0555  AST 20  ALT 33  ALKPHOS 73  BILITOT 0.5  PROT 5.9*  ALBUMIN 2.8*   No results for input(s): LIPASE, AMYLASE in the last 168 hours. No results for input(s): AMMONIA in the last 168 hours. Coagulation Profile: No results for input(s): INR, PROTIME in the last 168 hours. CBC: Recent Labs  Lab 07/19/21 1835 07/21/21 0555  WBC 5.6 4.4  NEUTROABS  --  2.4  HGB 11.9* 11.6*  HCT 37.0 36.6  MCV 81.1 82.1  PLT 153 134*   Cardiac Enzymes: No results for input(s): CKTOTAL, CKMB, CKMBINDEX, TROPONINI in the last 168 hours. BNP: Invalid input(s): POCBNP CBG: Recent Labs  Lab 07/20/21 1158 07/20/21 1727 07/20/21 2144 07/21/21 0710 07/21/21 1118  GLUCAP 162* 112* 142* 142* 155*   HbA1C: Recent Labs    07/20/21 0746  HGBA1C 7.5*   Urine analysis:    Component Value Date/Time   COLORURINE YELLOW 11/29/2018 [place] 11/29/2018 0414   LABSPEC 1.008 11/29/2018 0414   PHURINE 5.0 11/29/2018 0414   GLUCOSEU NEGATIVE 11/29/2018 0414   HGBUR SMALL (A) 11/29/2018 0414   HGBUR negative 04/22/2008 1009   BILIRUBINUR NEGATIVE 11/29/2018 0414   KETONESUR NEGATIVE 11/29/2018 0414   PROTEINUR NEGATIVE 11/29/2018 0414   UROBILINOGEN 0.2 12/01/2014 2103   NITRITE NEGATIVE 11/29/2018 0414   LEUKOCYTESUR TRACE (A) 11/29/2018 0414   Sepsis Labs: '@LABRCNTIP'$ (procalcitonin:4,lacticidven:4) ) Recent Results (from the past 240 hour(s))  Resp Panel by RT-PCR (Flu A&B, Covid) Nasopharyngeal Swab     Status: None   Collection Time: 07/19/21  8:58 PM   Specimen: Nasopharyngeal Swab; Nasopharyngeal(NP) swabs in vial transport medium  Result Value Ref Range Status   SARS Coronavirus 2 by RT PCR NEGATIVE NEGATIVE Final    Comment: (NOTE) SARS-CoV-2 target nucleic acids are NOT  DETECTED.  The SARS-CoV-2 RNA is generally detectable in upper respiratory specimens during the acute phase of infection. The lowest concentration of SARS-CoV-2 viral copies this assay can detect is 138 copies/mL. A negative result does not preclude SARS-Cov-2 infection and should not be used as the sole basis for treatment or other patient management decisions. A negative result may occur with  improper specimen collection/handling, submission of specimen other than nasopharyngeal swab, presence of viral mutation(s) within the areas targeted by this assay, and inadequate number of viral copies(<138 copies/mL). A negative result must be combined with clinical observations, patient history, and epidemiological information. The expected result is Negative.  Fact Sheet for Patients:  EntrepreneurPulse.com.au  Fact Sheet for Healthcare Providers:  IncredibleEmployment.be  This test is no t yet  approved or cleared by the [place] and  has been authorized for detection and/or diagnosis of SARS-CoV-2 by FDA under an Emergency Use Authorization (EUA). This EUA will remain  in effect (meaning this test can be used) for the duration of the COVID-19 declaration under Section 564(b)(1) of the Act, 21 U.S.C.section 360bbb-3(b)(1), unless the authorization is terminated  or revoked sooner.       Influenza A by PCR NEGATIVE NEGATIVE Final   Influenza B by PCR NEGATIVE NEGATIVE Final    Comment: (NOTE) The Xpert Xpress SARS-CoV-2/FLU/RSV plus assay is intended as an aid in the diagnosis of influenza from Nasopharyngeal swab specimens and should not be used as a sole basis for treatment. Nasal washings and aspirates are unacceptable for Xpert Xpress SARS-CoV-2/FLU/RSV testing.  Fact Sheet for Patients: EntrepreneurPulse.com.au  Fact Sheet for Healthcare Providers: IncredibleEmployment.be  This test is not yet  approved or cleared by the [place] FDA and has been authorized for detection and/or diagnosis of SARS-CoV-2 by FDA under an Emergency Use Authorization (EUA). This EUA will remain in effect (meaning this test can be used) for the duration of the COVID-19 declaration under Section 564(b)(1) of the Act, 21 U.S.C. section 360bbb-3(b)(1), unless the authorization is terminated or revoked.  Performed at [facility], [address] [place], [place] [address]      Scheduled Meds:  amLODipine  10 mg Oral Daily   aspirin EC  81 mg Oral Daily   carvedilol  25 mg Oral BID WC   insulin aspart  0-9 Units Subcutaneous TID WC   potassium chloride  40 mEq Oral Once   rosuvastatin  20 mg Oral Daily   Continuous Infusions:  heparin 1,150 Units/hr (07/21/21 0940)    Procedures/Studies: DG Chest 2 View  Result Date: 07/19/2021 CLINICAL DATA:  Cough and shortness of breath x1 week. EXAM: CHEST - 2 VIEW COMPARISON:  November 28, 2018 FINDINGS: Mild atelectasis and/or early infiltrate is seen within the mid right lung and left lung base. There is no evidence of a pleural effusion or pneumothorax. The cardiac silhouette is moderately enlarged and unchanged in size. There is stable prominence of the right hilar pulmonary vasculature. Marked severity calcification of the aortic arch is seen with marked severity tortuosity of the descending thoracic aorta. The visualized skeletal structures are unremarkable. IMPRESSION: Stable cardiomegaly with mild mid right lung and left basilar atelectasis and/or infiltrate. Electronically Signed   By: Virgina Norfolk M.D.   On: 07/19/2021 19:18   CT Angio Chest PE W and/or Wo Contrast  Result Date: 07/20/2021 CLINICAL DATA:  87 year old female with concern for pulmonary embolism. EXAM: CT ANGIOGRAPHY CHEST WITH CONTRAST TECHNIQUE: Multidetector CT imaging of the chest was performed using the standard protocol during bolus administration of intravenous contrast.  Multiplanar CT image reconstructions and MIPs were obtained to evaluate the vascular anatomy. CONTRAST:  135m OMNIPAQUE IOHEXOL 350 MG/ML SOLN COMPARISON:  Chest radiograph dated 07/19/2021. FINDINGS: Cardiovascular: There is mild cardiomegaly. There is dilatation of the right atrium with retrograde flow of contrast from the right atrium into the IVC indicative right heart dysfunction. Correlation with echocardiogram recommended. No pericardial effusion. There is coronary vascular calcification. Moderate atherosclerotic calcification of the thoracic aorta. No aneurysmal dilatation. Evaluation of the aorta is limited due to suboptimal opacification and timing of the contrast. There is a left-sided aortic arch with aberrant right subclavian artery anatomy. Large bilateral pulmonary artery emboli involving the lobar branches extending into the segmental and subsegmental branches. There  is large clot burden in the right lower lobe. No CT evidence of right heart straining. The right ventricular lumen measures approximately 4 cm and the left ventricular lumen measures 5.4 cm for a ratio of 0.7. Mediastinum/Nodes: No hilar or mediastinal adenopathy. The esophagus is grossly unremarkable. No mediastinal fluid collection. Lungs/Pleura: Patchy area of ground-glass opacity involving the left lung base may represent atelectasis or infiltrate 6. Developing infarct is not excluded. There is no pleural effusion pneumothorax. The central airways are patent. Upper Abdomen: No acute abnormality. Musculoskeletal: Degenerative changes of the spine and osteopenia. No acute osseous pathology. Review of the MIP images confirms the above findings. IMPRESSION: 1. Large bilateral pulmonary artery emboli involving the lobar branches extending into the segmental and subsegmental branches. No CT evidence of right heart straining. 2. Left lung base atelectasis versus infiltrate. Developing infarct is not excluded. 3. Mild cardiomegaly with  evidence of right heart dysfunction. Correlation with echocardiogram recommended. 4. Left-sided aortic arch with aberrant right subclavian artery anatomy. 5. Aortic Atherosclerosis (ICD10-I70.0). These results were called by telephone at the time of interpretation on 07/19/2021 at 11:59 pm to provider [facility] , who verbally acknowledged these results. Electronically Signed   By: Anner Crete M.D.   On: 07/20/2021 00:02   US Venous Img Lower Bilateral (DVT)  Result Date: 07/20/2021 CLINICAL DATA:  Pulmonary embolism. Shortness of breath for 2 weeks. EXAM: BILATERAL LOWER EXTREMITY VENOUS DOPPLER ULTRASOUND TECHNIQUE: Gray-scale sonography with graded compression, as well as color Doppler and duplex ultrasound were performed to evaluate the lower extremity deep venous systems from the level of the common femoral vein and including the common femoral, femoral, profunda femoral, popliteal and calf veins including the posterior tibial, peroneal and gastrocnemius veins when visible. The superficial great saphenous vein was also interrogated. Spectral Doppler was utilized to evaluate flow at rest and with distal augmentation maneuvers in the common femoral, femoral and popliteal veins. COMPARISON:  None. FINDINGS: RIGHT LOWER EXTREMITY Common Femoral Vein: No evidence of thrombus. Normal compressibility, respiratory phasicity and response to augmentation. Saphenofemoral Junction: No evidence of thrombus. Normal compressibility and flow on color Doppler imaging. Profunda Femoral Vein: No evidence of thrombus. Normal compressibility and flow on color Doppler imaging. Femoral Vein: No evidence of thrombus. Normal compressibility, respiratory phasicity and response to augmentation. Popliteal Vein: Occlusive thrombus identified within the posterior tibial vein. Calf Veins: No evidence of thrombus. Normal compressibility and flow on color Doppler imaging. Superficial Great Saphenous Vein: No evidence of  thrombus. Normal compressibility. Venous Reflux:  None. Other Findings:  None. LEFT LOWER EXTREMITY Common Femoral Vein: No evidence of thrombus. Normal compressibility, respiratory phasicity and response to augmentation. Saphenofemoral Junction: No evidence of thrombus. Normal compressibility and flow on color Doppler imaging. Profunda Femoral Vein: No evidence of thrombus. Normal compressibility and flow on color Doppler imaging. Femoral Vein: Occlusive thrombus noted. Popliteal Vein: Appears normal. Calf Veins: Appears normal Superficial Great Saphenous Vein: No evidence of thrombus. Normal compressibility. Venous Reflux:  None. Other Findings:  None. IMPRESSION: Exam positive for deep vein thrombi within the right posterior tibial vein and left femoral vein. Electronically Signed   By: Kerby Moors M.D.   On: 07/20/2021 12:24   ECHOCARDIOGRAM COMPLETE  Result Date: 07/20/2021    ECHOCARDIOGRAM REPORT   Patient Name:   Rachel Copeland [facility] Date of Exam: 07/20/2021 Medical Rec #:  [id number]           Height:       65.0 in Accession #:  [id number]          Weight:       193.6 lb Date of Birth:  [birth year]           BSA:          1.951 m Patient Age:    43 years            BP:           130/60 mmHg Patient Gender: F                   HR:           65 bpm. Exam Location:  [facility] Procedure: 2D Echo, Cardiac Doppler and Color Doppler Indications:    Pulmonary embolus  History:        Patient has prior history of Echocardiogram examinations, most                 recent 12/02/2014. CHF; Risk Factors:Hypertension, Diabetes and                 Dyslipidemia.  Sonographer:    Wenda Low Referring Phys: [id number] ASIA B [place]  1. Left ventricular ejection fraction, by estimation, is 50 to 55%. The left ventricle has low normal function. The left ventricle has no regional wall motion abnormalities. There is severe left ventricular hypertrophy. Left ventricular diastolic parameters are  indeterminate.  2. The ventricular septum is flattened in diastole and systole suggesting RV pressure and volume overload. . Right ventricular systolic function is moderately reduced. The right ventricular size is severely enlarged. There is severely elevated pulmonary  artery systolic pressure.  3. Right atrial size was severely dilated.  4. A small pericardial effusion is present. The pericardial effusion is circumferential.  5. The mitral valve is normal in structure. No evidence of mitral valve regurgitation. No evidence of mitral stenosis.  6. The tricuspid valve is abnormal. Tricuspid valve regurgitation is moderate.  7. The aortic valve is tricuspid. There is mild calcification of the aortic valve. There is mild thickening of the aortic valve. Aortic valve regurgitation is mild. No aortic stenosis is present.  8. The inferior vena cava is normal in size with <50% respiratory variability, suggesting right atrial pressure of 8 mmHg.  9. Severe left ventricular hypertrophy with somewhat of a speckled appearance, diffuse valve thickening, pericardial effusion. Findings may suggest an infiltrative process such as cardiac amyloidosis, consider outpatient cardiac MRI. FINDINGS  Left Ventricle: Left ventricular ejection fraction, by estimation, is 50 to 55%. The left ventricle has low normal function. The left ventricle has no regional wall motion abnormalities. The left ventricular internal cavity size was normal in size. There is severe left ventricular hypertrophy. Left ventricular diastolic parameters are indeterminate. Right Ventricle: The ventricular septum is flattened in diastole and systole suggesting RV pressure and volume overload. The right ventricular size is severely enlarged. Right vetricular wall thickness was not assessed. Right ventricular systolic function is moderately reduced. There is severely elevated pulmonary artery systolic pressure. The tricuspid regurgitant velocity is 4.06 m/s, and with  an assumed right atrial pressure of 8 mmHg, the estimated right ventricular systolic pressure is [id number]. Left Atrium: Left atrial size was normal in size. Right Atrium: Right atrial size was severely dilated. Pericardium: A small pericardial effusion is present. The pericardial effusion is circumferential. Mitral Valve: The mitral valve is normal in structure. There is mild thickening of the mitral valve leaflet(s). There is mild calcification of the mitral  valve leaflet(s). Mild mitral annular calcification. No evidence of mitral valve regurgitation. No evidence of mitral valve stenosis. MV peak gradient, 5.8 mmHg. The mean mitral valve gradient is 2.0 mmHg. Tricuspid Valve: The tricuspid valve is abnormal. Tricuspid valve regurgitation is moderate . No evidence of tricuspid stenosis. Aortic Valve: The aortic valve is tricuspid. There is mild calcification of the aortic valve. There is mild thickening of the aortic valve. There is mild aortic valve annular calcification. Aortic valve regurgitation is mild. No aortic stenosis is present. Aortic valve mean gradient measures 3.0 mmHg. Aortic valve peak gradient measures 5.1 mmHg. Aortic valve area, by VTI measures 2.66 cm. Pulmonic Valve: The pulmonic valve was not well visualized. Pulmonic valve regurgitation is mild. No evidence of pulmonic stenosis. Aorta: The aortic root is normal in size and structure. Venous: The inferior vena cava is normal in size with less than 50% respiratory variability, suggesting right atrial pressure of 8 mmHg. IAS/Shunts: No atrial level shunt detected by color flow Doppler.  LEFT VENTRICLE PLAX 2D LVIDd:         3.19 cm     Diastology LVIDs:         2.24 cm     LV e' medial:    4.03 cm/s LV PW:         1.70 cm     LV E/e' medial:  11.4 LV IVS:        1.93 cm     LV e' lateral:   9.29 cm/s LVOT diam:     2.00 cm     LV E/e' lateral: 4.9 LV SV:         68 LV SV Index:   35 LVOT Area:     3.14 cm  LV Volumes (MOD) LV vol d, MOD A2C:  45.4 ml LV vol d, MOD A4C: 70.2 ml LV vol s, MOD A2C: 30.1 ml LV vol s, MOD A4C: 32.1 ml LV SV MOD A2C:     15.3 ml LV SV MOD A4C:     70.2 ml LV SV MOD BP:      28.2 ml RIGHT VENTRICLE RV Basal diam:  3.63 cm RV Mid diam:    4.32 cm RV S prime:     7.45 cm/s TAPSE (M-mode): 1.5 cm LEFT ATRIUM             Index       RIGHT ATRIUM           Index LA diam:        4.60 cm 2.36 cm/m  RA Area:     28.60 cm LA Vol (A2C):   69.7 ml 35.73 ml/m RA Volume:   105.00 ml 53.82 ml/m LA Vol (A4C):   50.5 ml 25.89 ml/m LA Biplane Vol: 59.4 ml 30.45 ml/m  AORTIC VALVE AV Area (Vmax):    2.56 cm AV Area (Vmean):   2.34 cm AV Area (VTI):     2.66 cm AV Vmax:           113.00 cm/s AV Vmean:          78.500 cm/s AV VTI:            0.257 m AV Peak Grad:      5.1 mmHg AV Mean Grad:      3.0 mmHg LVOT Vmax:         92.00 cm/s LVOT Vmean:        58.500 cm/s LVOT VTI:  0.218 m LVOT/AV VTI ratio: 0.85  AORTA Ao Root diam: 3.20 cm Ao Asc diam:  3.30 cm MITRAL VALVE                TRICUSPID VALVE MV Area (PHT): 3.15 cm     TR Peak grad:   65.9 mmHg MV Area VTI:   2.17 cm     TR Vmax:        406.00 cm/s MV Peak grad:  5.8 mmHg MV Mean grad:  2.0 mmHg     SHUNTS MV Vmax:       1.20 m/s     Systemic VTI:  0.22 m MV Vmean:      57.4 cm/s    Systemic Diam: 2.00 cm MV Decel Time: 241 msec MV E velocity: 45.80 cm/s MV A velocity: 113.00 cm/s MV E/A ratio:  0.41 Carlyle Dolly MD Electronically signed by Carlyle Dolly MD Signature Date/Time: 07/20/2021/3:46:24 PM    Final     Orson Eva, DO  [facility]  If 7PM-7AM, please contact night-coverage [email or web address] Password [email or web address] 07/21/2021, 1:40 PM   LOS: 1 day

## 2021-07-22 LAB — BASIC METABOLIC PANEL
Anion gap: 6 (ref 5–15)
BUN: 19 mg/dL (ref 8–23)
CO2: 24 mmol/L (ref 22–32)
Calcium: 8.8 mg/dL — ABNORMAL LOW (ref 8.9–10.3)
Chloride: 109 mmol/L (ref 98–111)
Creatinine, Ser: 1.04 mg/dL — ABNORMAL HIGH (ref 0.44–1.00)
GFR, Estimated: 51 mL/min — ABNORMAL LOW (ref >60)
Glucose, Bld: 96 mg/dL (ref 70–99)
Potassium: 3.3 mmol/L — ABNORMAL LOW (ref 3.5–5.1)
Sodium: 139 mmol/L (ref 135–145)

## 2021-07-22 LAB — GLUCOSE, CAPILLARY
Glucose-Capillary: 100 mg/dL — ABNORMAL HIGH (ref 70–99)
Glucose-Capillary: 173 mg/dL — ABNORMAL HIGH (ref 70–99)
Glucose-Capillary: 133 mg/dL — ABNORMAL HIGH (ref 70–99)
Glucose-Capillary: 144 mg/dL — ABNORMAL HIGH (ref 70–99)

## 2021-07-22 LAB — CBC
HCT: 33 % — ABNORMAL LOW (ref 36.0–46.0)
Hemoglobin: 10.4 g/dL — ABNORMAL LOW (ref 12.0–15.0)
MCH: 25.5 pg — ABNORMAL LOW (ref 26.0–34.0)
MCHC: 31.5 g/dL (ref 30.0–36.0)
MCV: 80.9 fL (ref 80.0–100.0)
Platelets: 140 10*3/uL — ABNORMAL LOW (ref 150–400)
RBC: 4.08 MIL/uL (ref 3.87–5.11)
RDW: 17.6 % — ABNORMAL HIGH (ref 11.5–15.5)
WBC: 4.6 10*3/uL (ref 4.0–10.5)
nRBC: 0 % (ref 0.0–0.2)

## 2021-07-22 LAB — HEPARIN LEVEL (UNFRACTIONATED)
Heparin Unfractionated: 0.58 IU/mL (ref 0.30–0.70)
Heparin Unfractionated: 0.61 IU/mL (ref 0.30–0.70)

## 2021-07-22 LAB — HEMOGLOBIN A1C
Hgb A1c MFr Bld: 7.6 % — ABNORMAL HIGH (ref 4.8–5.6)
Mean Plasma Glucose: 171 mg/dL

## 2021-07-22 MED ORDER — POTASSIUM CHLORIDE CRYS ER 20 MEQ PO TBCR
40.0000 meq | EXTENDED_RELEASE_TABLET | Freq: Once | ORAL | Status: DC
  Filled 2021-07-22: qty 2

## 2021-07-22 NOTE — Progress Notes (Signed)
ANTICOAGULATION CONSULT NOTE -   Pharmacy Consult for heparin Indication: pulmonary embolus  Allergies  Allergen Reactions   Sitagliptin Phosphate     REACTION: Finger and hand tingling RUQ pain    Patient Measurements: Height: '5\' 5"'$  (165.1 cm) Weight: 87.8 kg (193 lb 9 oz) IBW/kg (Calculated) : 57 Heparin Dosing Weight: 76.2 kg  Vital Signs: Temp: 97.6 F (36.4 C) (07/29 1412) Temp Source: Oral (07/29 1412) BP: 130/96 (07/29 1412) Pulse Rate: 58 (07/29 1412)  Labs: Recent Labs    07/19/21 1835 07/19/21 2021 07/20/21 0745 07/21/21 0555 07/21/21 1757 07/22/21 0749 07/22/21 1519  HGB 11.9*  --   --  11.6*  --  10.4*  --   HCT 37.0  --   --  36.6  --  33.0*  --   PLT 153  --   --  134*  --  140*  --   HEPARINUNFRC  --   --    < > 0.96* 0.94* 0.58 0.61  CREATININE 1.03*  --   --  0.95  --  1.04*  --   TROPONINIHS 21* 18*  --   --   --   --   --    < > = values in this interval not displayed.     Estimated Creatinine Clearance: 40.1 mL/min (A) (by C-G formula based on SCr of 1.04 mg/dL (H)).   Medical History: Past Medical History:  Diagnosis Date   CHF (congestive heart failure) ([place])    Diabetes mellitus    no medications   Hyperlipidemia    Hypertension      Assessment: 87 yo lady with PE to start heparin.  She was not on anticoagulation PTA.    HL 0.61 - therapeutic  Goal of Therapy:  Heparin level 0.3-0.7 units/ml Monitor platelets by anticoagulation protocol: Yes   Plan:  Continue Heparin infusion at 950 units/hr. Check anti-Xa level daily while on heparin Continue to monitor H&H and platelets   Thank you for allowing [place] to participate in this patients care.  Margot Ables, PharmD Clinical Pharmacist 07/22/2021 4:13 PM

## 2021-07-22 NOTE — Progress Notes (Signed)
ANTICOAGULATION CONSULT NOTE -   Pharmacy Consult for heparin Indication: pulmonary embolus  Allergies  Allergen Reactions   Sitagliptin Phosphate     REACTION: Finger and hand tingling RUQ pain    Patient Measurements: Height: '5\' 5"'$  (165.1 cm) Weight: 87.8 kg (193 lb 9 oz) IBW/kg (Calculated) : 57 Heparin Dosing Weight: 76.2 kg  Vital Signs: Temp: 98.2 F (36.8 C) (07/29 0422) BP: 155/65 (07/29 0422) Pulse Rate: 65 (07/29 0422)  Labs: Recent Labs    07/19/21 1835 07/19/21 2021 07/20/21 0745 07/21/21 0555 07/21/21 1757 07/22/21 0749  HGB 11.9*  --   --  11.6*  --   --   HCT 37.0  --   --  36.6  --   --   PLT 153  --   --  134*  --   --   HEPARINUNFRC  --   --    < > 0.96* 0.94* 0.58  CREATININE 1.03*  --   --  0.95  --  1.04*  TROPONINIHS 21* 18*  --   --   --   --    < > = values in this interval not displayed.     Estimated Creatinine Clearance: 40.1 mL/min (A) (by C-G formula based on SCr of 1.04 mg/dL (H)).   Medical History: Past Medical History:  Diagnosis Date   CHF (congestive heart failure) ([place])    Diabetes mellitus    no medications   Hyperlipidemia    Hypertension      Assessment: 87 yo lady with PE to start heparin.  She was not on anticoagulation PTA.    HL 0.58- therapeutic  Goal of Therapy:  Heparin level 0.3-0.7 units/ml Monitor platelets by anticoagulation protocol: Yes   Plan:  Continue Heparin infusion at 950 units/hr. Check anti-Xa level in 8 hours and daily while on heparin Continue to monitor H&H and platelets   Thank you for allowing [place] to participate in this patients care.  Margot Ables, PharmD Clinical Pharmacist 07/22/2021 9:13 AM

## 2021-07-22 NOTE — Care Management Important Message (Signed)
Important Message  Patient Details  Name: Rachel Copeland MRN: [id number] Date of Birth: [birth year]   Medicare Important Message Given:  Yes     Tommy Medal 07/22/2021, 12:11 PM

## 2021-07-22 NOTE — Progress Notes (Signed)
PROGRESS NOTE  Rachel Copeland [id number] DOB: 01/22/1929 DOA: 07/19/2021 PCP: Neale Burly, MD  Brief History:  87 year old female with a history of diabetes mellitus type 2, hyperlipidemia, hypertension, CKD stage III presenting with 2-week history of worsening shortness of breath and coughing with clear yellow sputum.  States that she is primarily short of breath with exertion.  She denies any fevers, chills, chest pain, nausea, vomiting or diarrhea, abdominal pain.  She states that her leg edema is about the same if not a little bit better than usual.  She denies any hemoptysis.  She denies any recent surgery or travels.  She has not had any recent changes in any of her medications.  She has had extensive secondhand exposure to tobacco smoke from her late husband.  She has never smoked herself.  She denies any recent surgeries or trauma. In the emergency department, the patient was afebrile hemodynamically stable with oxygen saturation in the 80s on room air.  Oxygen saturation was 90-92% on 4 L.  CTA chest was positive for pulmonary emboli.  The patient was started on IV heparin.   Assessment/Plan: Acute respiratory failure with hypoxia -Secondary to pulmonary emboli -Currently stable on 4 L>>3L>>2L -Patient remains hemodynamically stable -07/19/2021 CTA chest large bilateral pulmonary artery emboli; LLL atelectasis versus infiltrate ?  Infarct; no RV strain noted -wean to RA for saturation >92%   Acute pulmonary emboli -07/20/2021 echo EF 50-55%, no WMA, IV septum flattening suggesting RV overload.  Moderate TR -Continue IV heparin -Venous duplex legs--DVT right posterior tibial and left femoral veins -unprovoked by history -check Factor V Leiden -check Lupus Anticoagulant -check prothrombin gene mutation   Diabetes mellitus type 2 -This is diet-controlled -7/27 A1c--7.5 -NovoLog sliding scale for now   Essential hypertension -Holding Diovan HCTZ -Continue  amlodipine and carvedilol   Hyperlipidemia -Continue Crestor   CKD stage IIIb -Baseline creatinine 1.0-1.3 -Monitor serial BMP   Chronic diastolic CHF -Appears clinically euvolemic   Hypokalemia -check mag--2.1 -replete       Status is: Inpatient   Remains inpatient appropriate because:Inpatient level of care appropriate due to severity of illness   Dispo: The patient is from: Home              Anticipated d/c is to: Home              Patient currently is not medically stable to d/c.              Difficult to place patient No               Family Communication: daughter updated 7/28   Consultants:  none   Code Status:  FULL   DVT Prophylaxis:  IV Heparin     Procedures: As Listed in Progress Note Above   Antibiotics: None    Subjective:  Patient denies fevers, chills, headache, chest pain, dyspnea, nausea, vomiting, diarrhea, abdominal pain, dysuria, hematuria, hematochezia, and melena.  Objective: Vitals:   07/21/21 1255 07/21/21 2114 07/22/21 0422 07/22/21 1412  BP: (!) 131/59 130/62 (!) 155/65 (!) 130/96  Pulse: (!) 55 60 65 (!) 58  Resp: '18 18 19 16  '$ Temp: 97.6 F (36.4 C) 98.2 F (36.8 C) 98.2 F (36.8 C) 97.6 F (36.4 C)  TempSrc: Oral   Oral  SpO2: 99% 96% 97% 98%  Weight:      Height:        Intake/Output Summary (Last 24  hours) at 07/22/2021 1801 Last data filed at 07/22/2021 1500 Gross per 24 hour  Intake 480 ml  Output 400 ml  Net 80 ml   Weight change:  Exam:  General:  Pt is alert, follows commands appropriately, not in acute distress HEENT: No icterus, No thrush, No neck mass, [place]/AT Cardiovascular: RRR, S1/S2, no rubs, no gallops Respiratory: bibasilar rales. No wheeze Abdomen: Soft/+BS, non tender, non distended, no guarding Extremities: 1 + LE edema, No lymphangitis, No petechiae, No rashes, no synovitis   Data Reviewed: I have personally reviewed following labs and imaging studies Basic Metabolic Panel: Recent  Labs  Lab 07/19/21 1835 07/21/21 0555 07/22/21 0749  NA 136 141 139  K 3.5 3.1* 3.3*  CL 105 110 109  CO2 '24 23 24  '$ GLUCOSE 155* 103* 96  BUN '20 18 19  '$ CREATININE 1.03* 0.95 1.04*  CALCIUM 9.3 9.0 8.8*  MG  --  2.1  --    Liver Function Tests: Recent Labs  Lab 07/21/21 0555  AST 20  ALT 33  ALKPHOS 73  BILITOT 0.5  PROT 5.9*  ALBUMIN 2.8*   No results for input(s): LIPASE, AMYLASE in the last 168 hours. No results for input(s): AMMONIA in the last 168 hours. Coagulation Profile: No results for input(s): INR, PROTIME in the last 168 hours. CBC: Recent Labs  Lab 07/19/21 1835 07/21/21 0555 07/22/21 0749  WBC 5.6 4.4 4.6  NEUTROABS  --  2.4  --   HGB 11.9* 11.6* 10.4*  HCT 37.0 36.6 33.0*  MCV 81.1 82.1 80.9  PLT 153 134* 140*   Cardiac Enzymes: No results for input(s): CKTOTAL, CKMB, CKMBINDEX, TROPONINI in the last 168 hours. BNP: Invalid input(s): POCBNP CBG: Recent Labs  Lab 07/21/21 1605 07/21/21 2154 07/22/21 0717 07/22/21 1132 07/22/21 1725  GLUCAP 140* 148* 100* 173* 133*   HbA1C: Recent Labs    07/20/21 0746 07/21/21 0555  HGBA1C 7.5* 7.6*   Urine analysis:    Component Value Date/Time   COLORURINE YELLOW 11/29/2018 Long 11/29/2018 0414   LABSPEC 1.008 11/29/2018 0414   PHURINE 5.0 11/29/2018 0414   GLUCOSEU NEGATIVE 11/29/2018 0414   HGBUR SMALL (A) 11/29/2018 0414   HGBUR negative 04/22/2008 1009   BILIRUBINUR NEGATIVE 11/29/2018 0414   KETONESUR NEGATIVE 11/29/2018 0414   PROTEINUR NEGATIVE 11/29/2018 0414   UROBILINOGEN 0.2 12/01/2014 2103   NITRITE NEGATIVE 11/29/2018 0414   LEUKOCYTESUR TRACE (A) 11/29/2018 0414   Sepsis Labs: '@LABRCNTIP'$ (procalcitonin:4,lacticidven:4) ) Recent Results (from the past 240 hour(s))  Resp Panel by RT-PCR (Flu A&B, Covid) Nasopharyngeal Swab     Status: None   Collection Time: 07/19/21  8:58 PM   Specimen: Nasopharyngeal Swab; Nasopharyngeal(NP) swabs in vial transport  medium  Result Value Ref Range Status   SARS Coronavirus 2 by RT PCR NEGATIVE NEGATIVE Final    Comment: (NOTE) SARS-CoV-2 target nucleic acids are NOT DETECTED.  The SARS-CoV-2 RNA is generally detectable in upper respiratory specimens during the acute phase of infection. The lowest concentration of SARS-CoV-2 viral copies this assay can detect is 138 copies/mL. A negative result does not preclude SARS-Cov-2 infection and should not be used as the sole basis for treatment or other patient management decisions. A negative result may occur with  improper specimen collection/handling, submission of specimen other than nasopharyngeal swab, presence of viral mutation(s) within the areas targeted by this assay, and inadequate number of viral copies(<138 copies/mL). A negative result must be combined with clinical observations, patient  history, and epidemiological information. The expected result is Negative.  Fact Sheet for Patients:  EntrepreneurPulse.com.au  Fact Sheet for Healthcare Providers:  IncredibleEmployment.be  This test is no t yet approved or cleared by the [place] FDA and  has been authorized for detection and/or diagnosis of SARS-CoV-2 by FDA under an Emergency Use Authorization (EUA). This EUA will remain  in effect (meaning this test can be used) for the duration of the COVID-19 declaration under Section 564(b)(1) of the Act, 21 U.S.C.section 360bbb-3(b)(1), unless the authorization is terminated  or revoked sooner.       Influenza A by PCR NEGATIVE NEGATIVE Final   Influenza B by PCR NEGATIVE NEGATIVE Final    Comment: (NOTE) The Xpert Xpress SARS-CoV-2/FLU/RSV plus assay is intended as an aid in the diagnosis of influenza from Nasopharyngeal swab specimens and should not be used as a sole basis for treatment. Nasal washings and aspirates are unacceptable for Xpert Xpress SARS-CoV-2/FLU/RSV testing.  Fact Sheet for  Patients: EntrepreneurPulse.com.au  Fact Sheet for Healthcare Providers: IncredibleEmployment.be  This test is not yet approved or cleared by the [place] FDA and has been authorized for detection and/or diagnosis of SARS-CoV-2 by FDA under an Emergency Use Authorization (EUA). This EUA will remain in effect (meaning this test can be used) for the duration of the COVID-19 declaration under Section 564(b)(1) of the Act, 21 U.S.C. section 360bbb-3(b)(1), unless the authorization is terminated or revoked.  Performed at [facility], [address] [place], [place] [address]      Scheduled Meds:  amLODipine  10 mg Oral Daily   aspirin EC  81 mg Oral Daily   carvedilol  25 mg Oral BID WC   insulin aspart  0-9 Units Subcutaneous TID WC   rosuvastatin  20 mg Oral Daily   Continuous Infusions:  heparin 950 Units/hr (07/21/21 1858)    Procedures/Studies: DG Chest 2 View  Result Date: 07/19/2021 CLINICAL DATA:  Cough and shortness of breath x1 week. EXAM: CHEST - 2 VIEW COMPARISON:  November 28, 2018 FINDINGS: Mild atelectasis and/or early infiltrate is seen within the mid right lung and left lung base. There is no evidence of a pleural effusion or pneumothorax. The cardiac silhouette is moderately enlarged and unchanged in size. There is stable prominence of the right hilar pulmonary vasculature. Marked severity calcification of the aortic arch is seen with marked severity tortuosity of the descending thoracic aorta. The visualized skeletal structures are unremarkable. IMPRESSION: Stable cardiomegaly with mild mid right lung and left basilar atelectasis and/or infiltrate. Electronically Signed   By: Virgina Norfolk M.D.   On: 07/19/2021 19:18   CT Angio Chest PE W and/or Wo Contrast  Result Date: 07/20/2021 CLINICAL DATA:  87 year old female with concern for pulmonary embolism. EXAM: CT ANGIOGRAPHY CHEST WITH CONTRAST TECHNIQUE: Multidetector CT  imaging of the chest was performed using the standard protocol during bolus administration of intravenous contrast. Multiplanar CT image reconstructions and MIPs were obtained to evaluate the vascular anatomy. CONTRAST:  192m OMNIPAQUE IOHEXOL 350 MG/ML SOLN COMPARISON:  Chest radiograph dated 07/19/2021. FINDINGS: Cardiovascular: There is mild cardiomegaly. There is dilatation of the right atrium with retrograde flow of contrast from the right atrium into the IVC indicative right heart dysfunction. Correlation with echocardiogram recommended. No pericardial effusion. There is coronary vascular calcification. Moderate atherosclerotic calcification of the thoracic aorta. No aneurysmal dilatation. Evaluation of the aorta is limited due to suboptimal opacification and timing of the contrast. There is a left-sided aortic arch with  aberrant right subclavian artery anatomy. Large bilateral pulmonary artery emboli involving the lobar branches extending into the segmental and subsegmental branches. There is large clot burden in the right lower lobe. No CT evidence of right heart straining. The right ventricular lumen measures approximately 4 cm and the left ventricular lumen measures 5.4 cm for a ratio of 0.7. Mediastinum/Nodes: No hilar or mediastinal adenopathy. The esophagus is grossly unremarkable. No mediastinal fluid collection. Lungs/Pleura: Patchy area of ground-glass opacity involving the left lung base may represent atelectasis or infiltrate 6. Developing infarct is not excluded. There is no pleural effusion pneumothorax. The central airways are patent. Upper Abdomen: No acute abnormality. Musculoskeletal: Degenerative changes of the spine and osteopenia. No acute osseous pathology. Review of the MIP images confirms the above findings. IMPRESSION: 1. Large bilateral pulmonary artery emboli involving the lobar branches extending into the segmental and subsegmental branches. No CT evidence of right heart straining.  2. Left lung base atelectasis versus infiltrate. Developing infarct is not excluded. 3. Mild cardiomegaly with evidence of right heart dysfunction. Correlation with echocardiogram recommended. 4. Left-sided aortic arch with aberrant right subclavian artery anatomy. 5. Aortic Atherosclerosis (ICD10-I70.0). These results were called by telephone at the time of interpretation on 07/19/2021 at 11:59 pm to provider [facility] , who verbally acknowledged these results. Electronically Signed   By: Anner Crete M.D.   On: 07/20/2021 00:02   US Venous Img Lower Bilateral (DVT)  Result Date: 07/20/2021 CLINICAL DATA:  Pulmonary embolism. Shortness of breath for 2 weeks. EXAM: BILATERAL LOWER EXTREMITY VENOUS DOPPLER ULTRASOUND TECHNIQUE: Gray-scale sonography with graded compression, as well as color Doppler and duplex ultrasound were performed to evaluate the lower extremity deep venous systems from the level of the common femoral vein and including the common femoral, femoral, profunda femoral, popliteal and calf veins including the posterior tibial, peroneal and gastrocnemius veins when visible. The superficial great saphenous vein was also interrogated. Spectral Doppler was utilized to evaluate flow at rest and with distal augmentation maneuvers in the common femoral, femoral and popliteal veins. COMPARISON:  None. FINDINGS: RIGHT LOWER EXTREMITY Common Femoral Vein: No evidence of thrombus. Normal compressibility, respiratory phasicity and response to augmentation. Saphenofemoral Junction: No evidence of thrombus. Normal compressibility and flow on color Doppler imaging. Profunda Femoral Vein: No evidence of thrombus. Normal compressibility and flow on color Doppler imaging. Femoral Vein: No evidence of thrombus. Normal compressibility, respiratory phasicity and response to augmentation. Popliteal Vein: Occlusive thrombus identified within the posterior tibial vein. Calf Veins: No evidence of thrombus.  Normal compressibility and flow on color Doppler imaging. Superficial Great Saphenous Vein: No evidence of thrombus. Normal compressibility. Venous Reflux:  None. Other Findings:  None. LEFT LOWER EXTREMITY Common Femoral Vein: No evidence of thrombus. Normal compressibility, respiratory phasicity and response to augmentation. Saphenofemoral Junction: No evidence of thrombus. Normal compressibility and flow on color Doppler imaging. Profunda Femoral Vein: No evidence of thrombus. Normal compressibility and flow on color Doppler imaging. Femoral Vein: Occlusive thrombus noted. Popliteal Vein: Appears normal. Calf Veins: Appears normal Superficial Great Saphenous Vein: No evidence of thrombus. Normal compressibility. Venous Reflux:  None. Other Findings:  None. IMPRESSION: Exam positive for deep vein thrombi within the right posterior tibial vein and left femoral vein. Electronically Signed   By: Kerby Moors M.D.   On: 07/20/2021 12:24   ECHOCARDIOGRAM COMPLETE  Result Date: 07/20/2021    ECHOCARDIOGRAM REPORT   Patient Name:   GYDA BRICKER [facility] Date of Exam: 07/20/2021 Medical Rec #:  [id number]  Height:       65.0 in Accession #:    [id number]          Weight:       193.6 lb Date of Birth:  [birth year]           BSA:          1.951 m Patient Age:    53 years            BP:           130/60 mmHg Patient Gender: F                   HR:           65 bpm. Exam Location:  [facility] Procedure: 2D Echo, Cardiac Doppler and Color Doppler Indications:    Pulmonary embolus  History:        Patient has prior history of Echocardiogram examinations, most                 recent 12/02/2014. CHF; Risk Factors:Hypertension, Diabetes and                 Dyslipidemia.  Sonographer:    Wenda Low Referring Phys: [id number] ASIA B [place]  1. Left ventricular ejection fraction, by estimation, is 50 to 55%. The left ventricle has low normal function. The left ventricle has no regional wall motion  abnormalities. There is severe left ventricular hypertrophy. Left ventricular diastolic parameters are indeterminate.  2. The ventricular septum is flattened in diastole and systole suggesting RV pressure and volume overload. . Right ventricular systolic function is moderately reduced. The right ventricular size is severely enlarged. There is severely elevated pulmonary  artery systolic pressure.  3. Right atrial size was severely dilated.  4. A small pericardial effusion is present. The pericardial effusion is circumferential.  5. The mitral valve is normal in structure. No evidence of mitral valve regurgitation. No evidence of mitral stenosis.  6. The tricuspid valve is abnormal. Tricuspid valve regurgitation is moderate.  7. The aortic valve is tricuspid. There is mild calcification of the aortic valve. There is mild thickening of the aortic valve. Aortic valve regurgitation is mild. No aortic stenosis is present.  8. The inferior vena cava is normal in size with <50% respiratory variability, suggesting right atrial pressure of 8 mmHg.  9. Severe left ventricular hypertrophy with somewhat of a speckled appearance, diffuse valve thickening, pericardial effusion. Findings may suggest an infiltrative process such as cardiac amyloidosis, consider outpatient cardiac MRI. FINDINGS  Left Ventricle: Left ventricular ejection fraction, by estimation, is 50 to 55%. The left ventricle has low normal function. The left ventricle has no regional wall motion abnormalities. The left ventricular internal cavity size was normal in size. There is severe left ventricular hypertrophy. Left ventricular diastolic parameters are indeterminate. Right Ventricle: The ventricular septum is flattened in diastole and systole suggesting RV pressure and volume overload. The right ventricular size is severely enlarged. Right vetricular wall thickness was not assessed. Right ventricular systolic function is moderately reduced. There is severely  elevated pulmonary artery systolic pressure. The tricuspid regurgitant velocity is 4.06 m/s, and with an assumed right atrial pressure of 8 mmHg, the estimated right ventricular systolic pressure is [id number]. Left Atrium: Left atrial size was normal in size. Right Atrium: Right atrial size was severely dilated. Pericardium: A small pericardial effusion is present. The pericardial effusion is circumferential. Mitral Valve: The mitral valve is normal in structure. There is  mild thickening of the mitral valve leaflet(s). There is mild calcification of the mitral valve leaflet(s). Mild mitral annular calcification. No evidence of mitral valve regurgitation. No evidence of mitral valve stenosis. MV peak gradient, 5.8 mmHg. The mean mitral valve gradient is 2.0 mmHg. Tricuspid Valve: The tricuspid valve is abnormal. Tricuspid valve regurgitation is moderate . No evidence of tricuspid stenosis. Aortic Valve: The aortic valve is tricuspid. There is mild calcification of the aortic valve. There is mild thickening of the aortic valve. There is mild aortic valve annular calcification. Aortic valve regurgitation is mild. No aortic stenosis is present. Aortic valve mean gradient measures 3.0 mmHg. Aortic valve peak gradient measures 5.1 mmHg. Aortic valve area, by VTI measures 2.66 cm. Pulmonic Valve: The pulmonic valve was not well visualized. Pulmonic valve regurgitation is mild. No evidence of pulmonic stenosis. Aorta: The aortic root is normal in size and structure. Venous: The inferior vena cava is normal in size with less than 50% respiratory variability, suggesting right atrial pressure of 8 mmHg. IAS/Shunts: No atrial level shunt detected by color flow Doppler.  LEFT VENTRICLE PLAX 2D LVIDd:         3.19 cm     Diastology LVIDs:         2.24 cm     LV e' medial:    4.03 cm/s LV PW:         1.70 cm     LV E/e' medial:  11.4 LV IVS:        1.93 cm     LV e' lateral:   9.29 cm/s LVOT diam:     2.00 cm     LV E/e' lateral:  4.9 LV SV:         68 LV SV Index:   35 LVOT Area:     3.14 cm  LV Volumes (MOD) LV vol d, MOD A2C: 45.4 ml LV vol d, MOD A4C: 70.2 ml LV vol s, MOD A2C: 30.1 ml LV vol s, MOD A4C: 32.1 ml LV SV MOD A2C:     15.3 ml LV SV MOD A4C:     70.2 ml LV SV MOD BP:      28.2 ml RIGHT VENTRICLE RV Basal diam:  3.63 cm RV Mid diam:    4.32 cm RV S prime:     7.45 cm/s TAPSE (M-mode): 1.5 cm LEFT ATRIUM             Index       RIGHT ATRIUM           Index LA diam:        4.60 cm 2.36 cm/m  RA Area:     28.60 cm LA Vol (A2C):   69.7 ml 35.73 ml/m RA Volume:   105.00 ml 53.82 ml/m LA Vol (A4C):   50.5 ml 25.89 ml/m LA Biplane Vol: 59.4 ml 30.45 ml/m  AORTIC VALVE AV Area (Vmax):    2.56 cm AV Area (Vmean):   2.34 cm AV Area (VTI):     2.66 cm AV Vmax:           113.00 cm/s AV Vmean:          78.500 cm/s AV VTI:            0.257 m AV Peak Grad:      5.1 mmHg AV Mean Grad:      3.0 mmHg LVOT Vmax:         92.00 cm/s LVOT Vmean:  58.500 cm/s LVOT VTI:          0.218 m LVOT/AV VTI ratio: 0.85  AORTA Ao Root diam: 3.20 cm Ao Asc diam:  3.30 cm MITRAL VALVE                TRICUSPID VALVE MV Area (PHT): 3.15 cm     TR Peak grad:   65.9 mmHg MV Area VTI:   2.17 cm     TR Vmax:        406.00 cm/s MV Peak grad:  5.8 mmHg MV Mean grad:  2.0 mmHg     SHUNTS MV Vmax:       1.20 m/s     Systemic VTI:  0.22 m MV Vmean:      57.4 cm/s    Systemic Diam: 2.00 cm MV Decel Time: 241 msec MV E velocity: 45.80 cm/s MV A velocity: 113.00 cm/s MV E/A ratio:  0.41 Carlyle Dolly MD Electronically signed by Carlyle Dolly MD Signature Date/Time: 07/20/2021/3:46:24 PM    Final     Orson Eva, DO  [facility]  If 7PM-7AM, please contact night-coverage [email or web address] Password [email or web address] 07/22/2021, 6:01 PM   LOS: 2 days

## 2021-07-22 NOTE — Plan of Care (Signed)

## 2021-07-23 DIAGNOSIS — I10 Essential (primary) hypertension: Secondary | ICD-10-CM

## 2021-07-23 LAB — CBC
HCT: 34.3 % — ABNORMAL LOW (ref 36.0–46.0)
Hemoglobin: 10.7 g/dL — ABNORMAL LOW (ref 12.0–15.0)
MCH: 25.6 pg — ABNORMAL LOW (ref 26.0–34.0)
MCHC: 31.2 g/dL (ref 30.0–36.0)
MCV: 82.1 fL (ref 80.0–100.0)
Platelets: 135 10*3/uL — ABNORMAL LOW (ref 150–400)
RBC: 4.18 MIL/uL (ref 3.87–5.11)
RDW: 17.7 % — ABNORMAL HIGH (ref 11.5–15.5)
WBC: 3.9 10*3/uL — ABNORMAL LOW (ref 4.0–10.5)
nRBC: 0 % (ref 0.0–0.2)

## 2021-07-23 LAB — GLUCOSE, CAPILLARY
Glucose-Capillary: 112 mg/dL — ABNORMAL HIGH (ref 70–99)
Glucose-Capillary: 166 mg/dL — ABNORMAL HIGH (ref 70–99)

## 2021-07-23 LAB — HEPARIN LEVEL (UNFRACTIONATED): Heparin Unfractionated: 0.5 IU/mL (ref 0.30–0.70)

## 2021-07-23 LAB — MAGNESIUM: Magnesium: 2.1 mg/dL (ref 1.7–2.4)

## 2021-07-23 LAB — BASIC METABOLIC PANEL
Anion gap: 7 (ref 5–15)
BUN: 17 mg/dL (ref 8–23)
CO2: 24 mmol/L (ref 22–32)
Calcium: 9.3 mg/dL (ref 8.9–10.3)
Chloride: 109 mmol/L (ref 98–111)
Creatinine, Ser: 0.87 mg/dL (ref 0.44–1.00)
GFR, Estimated: >60 mL/min (ref >60)
Glucose, Bld: 103 mg/dL — ABNORMAL HIGH (ref 70–99)
Potassium: 3.4 mmol/L — ABNORMAL LOW (ref 3.5–5.1)
Sodium: 140 mmol/L (ref 135–145)

## 2021-07-23 MED ORDER — COVID-19 MRNA VACC (MODERNA) 50 MCG/0.25ML IM SUSP
0.2500 mL | Freq: Once | INTRAMUSCULAR | Status: AC
  Administered 2021-07-23: 0.25 mL via INTRAMUSCULAR
  Filled 2021-07-23: qty 0.25

## 2021-07-23 MED ORDER — APIXABAN 5 MG PO TABS: 10.0000 mg | ORAL_TABLET | Freq: Two times a day (BID) | ORAL | 1 refills | Status: AC

## 2021-07-23 MED ORDER — APIXABAN 5 MG PO TABS
10.0000 mg | ORAL_TABLET | Freq: Two times a day (BID) | ORAL | Status: DC
Start: 2021-07-23 — End: 2021-07-23
  Administered 2021-07-23: 10 mg via ORAL
  Filled 2021-07-23: qty 2

## 2021-07-23 MED ORDER — APIXABAN 5 MG PO TABS
5.0000 mg | ORAL_TABLET | Freq: Two times a day (BID) | ORAL | Status: DC
Start: 2021-07-30 — End: 2021-07-23

## 2021-07-23 NOTE — Progress Notes (Signed)
ANTICOAGULATION CONSULT NOTE -   Pharmacy Consult for heparin Indication: pulmonary embolus  Allergies  Allergen Reactions   Sitagliptin Phosphate     REACTION: Finger and hand tingling RUQ pain    Patient Measurements: Height: '5\' 5"'$  (165.1 cm) Weight: 87.8 kg (193 lb 9 oz) IBW/kg (Calculated) : 57 Heparin Dosing Weight: 76.2 kg  Vital Signs: Temp: 97.7 F (36.5 C) (07/30 0452) Temp Source: Oral (07/30 0452) BP: 160/65 (07/30 0452) Pulse Rate: 52 (07/30 0452)  Labs: Recent Labs    07/21/21 0555 07/21/21 1757 07/22/21 0749 07/22/21 1519 07/23/21 0518 07/23/21 0521  HGB 11.6*  --  10.4*  --   --  10.7*  HCT 36.6  --  33.0*  --   --  34.3*  PLT 134*  --  140*  --   --  135*  HEPARINUNFRC 0.96*   < > 0.58 0.61 0.50  --   CREATININE 0.95  --  1.04*  --   --  0.87   < > = values in this interval not displayed.     Estimated Creatinine Clearance: 48 mL/min (by C-G formula based on SCr of 0.87 mg/dL).   Medical History: Past Medical History:  Diagnosis Date   CHF (congestive heart failure) ([place])    Diabetes mellitus    no medications   Hyperlipidemia    Hypertension      Assessment: 87 yo lady with PE to start heparin.  She was not on anticoagulation PTA.    HL 0.50 - therapeutic  Goal of Therapy:  Heparin level 0.3-0.7 units/ml Monitor platelets by anticoagulation protocol: Yes   Plan:  Continue Heparin infusion at 950 units/hr. Check anti-Xa level daily while on heparin Continue to monitor H&H and platelets   Thank you for allowing [place] to participate in this patients care.  Margot Ables, PharmD Clinical Pharmacist 07/23/2021 8:34 AM

## 2021-07-23 NOTE — TOC Transition Note (Signed)
Transition of Care [facility]) - CM/SW Discharge Note   Patient Details  Name: Rachel Copeland MRN: [id number] Date of Birth: 1929-09-15  [facility] CM/SW Contact:  Natasha Bence, LCSW Phone Number: 07/23/2021, 11:51 AM   Clinical Narrative:    CSW notified of patient's readiness for discharge, [place] needs and DME needs. CSW referred patient to Georgina Snell with [facility]. Georgina Snell agreeable to take patient. CSW referred patient to [employer] for walker. Jasmine with [employer] agreeable to provide patient with walker. TOC signing off.   Final next level of care: [place] Barriers to Discharge: Barriers Resolved   Patient Goals and CMS Choice Patient states their goals for this hospitalization and ongoing recovery are:: Return home with [facility] CMS Medicare.gov Compare Post Acute Care list provided to:: Patient Choice offered to / list presented to : Patient  Discharge Placement                    Patient and family notified of of transfer: 07/23/21  Discharge Plan and Services                DME Arranged: Gilford Rile rolling DME Agency: [employer] Date DME Agency Contacted: 07/23/21 Time DME Agency Contacted: [phone] Representative spoke with at DME Agency: [place]: PT [place]: [place] Date [place]: 07/23/21 Time [place]: 1148 Representative spoke with at [place]: [place] ([place]) Interventions     Readmission Risk Interventions No flowsheet data found.

## 2021-07-23 NOTE — Progress Notes (Signed)
Nsg Discharge Note  Admit Date:  07/19/2021 Discharge date: 07/23/2021   Dalia Heading to be D/C'd Home per MD order.  AVS completed.  Copy for chart, and copy for patient signed, and dated. Removed IVs-CDI. Reviewed d/c paperwork with patient and daughter. Answered all questions. Wheeled stable patient and belongings to main entrance where she was picked up by her daughter to d/c to home. Patient/caregiver able to verbalize understanding.  Discharge Medication: Allergies as of 07/23/2021       Reactions   Sitagliptin Phosphate    REACTION: Finger and hand tingling RUQ pain        Medication List     STOP taking these medications    furosemide 40 MG tablet Commonly known as: LASIX   potassium chloride 10 MEQ tablet Commonly known as: KLOR-CON   valsartan-hydrochlorothiazide 320-25 MG tablet Commonly known as: DIOVAN-HCT       TAKE these medications    amLODipine 10 MG tablet Commonly known as: NORVASC Take 10 mg by mouth daily.   apixaban 5 MG Tabs tablet Commonly known as: ELIQUIS Take 2 tablets (10 mg total) by mouth 2 (two) times daily. For 7 days. Then 1 tablet two times daily starting 07/30/21   calcium-vitamin D 500-200 MG-UNIT tablet Commonly known as: OSCAL WITH D Take 1 tablet by mouth every morning.   carvedilol 25 MG tablet Commonly known as: COREG Take 25 mg by mouth 2 (two) times daily with a meal.   fish oil-omega-3 fatty acids 1000 MG capsule Take 1 g by mouth every morning.   MULTIVITAMIN ADULT PO Take 1 tablet by mouth daily. One a day womens   rosuvastatin 10 MG tablet Commonly known as: CRESTOR Take 10 mg by mouth daily.   vitamin C 1000 MG tablet Take 1,000 mg by mouth every morning.               Durable Medical Equipment  (From admission, onward)           Start     Ordered   07/23/21 1127  For home use only DME Walker rolling  Once       Question Answer Comment  Walker: With 5 Inch Wheels   Patient needs a  walker to treat with the following condition Gait instability      07/23/21 1127            Discharge Assessment: Vitals:   07/23/21 0452 07/23/21 0910  BP: (!) 160/65   Pulse: (!) 52 65  Resp: 19   Temp: 97.7 F (36.5 C)   SpO2: 96%    Skin clean, dry and intact without evidence of skin break down, no evidence of skin tears noted. IV catheter discontinued intact. Site without signs and symptoms of complications - no redness or edema noted at insertion site, patient denies c/o pain - only slight tenderness at site.  Dressing with slight pressure applied.  D/c Instructions-Education: Discharge instructions given to patient/family with verbalized understanding. D/c education completed with patient/family including follow up instructions, medication list, d/c activities limitations if indicated, with other d/c instructions as indicated by MD - patient able to verbalize understanding, all questions fully answered. Patient instructed to return to ED, call 911, or call MD for any changes in condition.  Patient escorted via [place], and D/C home via private auto.  Santa Lighter, RN 07/23/2021 3:07 PM

## 2021-07-23 NOTE — Discharge Summary (Addendum)
Physician Discharge Summary  Rachel Copeland [id number] DOB: 08-16-1929 DOA: 07/19/2021  PCP: Neale Burly, MD  Admit date: 07/19/2021 Discharge date: 07/23/2021  Admitted From: Home Disposition:  Home / SNF  Recommendations for Outpatient Follow-up:  Follow up with PCP in 1-2 weeks Please obtain BMP/CBC in one week Please follow up on factor V leiden, lupus anticoagulant and prothrombin gene mutation assays   Home Health:HHPT Equipment/Devices:walker  Discharge Condition: Stable CODE STATUS:FULL Diet recommendation: Heart Healthy    Brief/Interim Summary: 87 year old female with a history of diabetes mellitus type 2, hyperlipidemia, hypertension, CKD stage III presenting with 2-week history of worsening shortness of breath and coughing with clear yellow sputum.  States that she is primarily short of breath with exertion.  She denies any fevers, chills, chest pain, nausea, vomiting or diarrhea, abdominal pain.  She states that her leg edema is about the same if not a little bit better than usual.  She denies any hemoptysis.  She denies any recent surgery or travels.  She has not had any recent changes in any of her medications.  She has had extensive secondhand exposure to tobacco smoke from her late husband.  She has never smoked herself.  She denies any recent surgeries or trauma. In the emergency department, the patient was afebrile hemodynamically stable with oxygen saturation in the 80s on room air.  Oxygen saturation was 90-92% on 4 L.  CTA chest was positive for pulmonary emboli.  The patient was started on IV heparin.  Discharge Diagnoses:  Acute respiratory failure with hypoxia -Secondary to pulmonary emboli -Currently stable on 4 L>>3L>>2L>>RA -Patient remains hemodynamically stable -07/19/2021 CTA chest large bilateral pulmonary artery emboli; LLL atelectasis versus infiltrate ?  Infarct; no RV strain noted -wean to RA for saturation >92% -stable on RA at time  of d/c   Acute pulmonary emboli -07/20/2021 echo EF 50-55%, no WMA, IV septum flattening suggesting RV overload.  Moderate TR -Had 3 days IV heparin>>home with apixaban -Venous duplex legs--DVT right posterior tibial and left femoral veins -unprovoked by history -check Factor V Leiden--pending at time of dc -check Lupus Anticoagulant -check prothrombin gene mutation   Diabetes mellitus type 2 -This is diet-controlled -7/27 A1c--7.5 -NovoLog sliding scale for now   Essential hypertension -Holding Diovan HCTZ -Continue amlodipine and carvedilol   Hyperlipidemia -Continue Crestor   CKD stage IIIb -Baseline creatinine 1.0-1.3 -Monitor serial BMP   Chronic diastolic CHF -Appears clinically euvolemic   Hypokalemia -check mag--2.1 -replete   Discharge Instructions   Allergies as of 07/23/2021       Reactions   Sitagliptin Phosphate    REACTION: Finger and hand tingling RUQ pain        Medication List     STOP taking these medications    furosemide 40 MG tablet Commonly known as: LASIX   potassium chloride 10 MEQ tablet Commonly known as: KLOR-CON   valsartan-hydrochlorothiazide 320-25 MG tablet Commonly known as: DIOVAN-HCT       TAKE these medications    amLODipine 10 MG tablet Commonly known as: NORVASC Take 10 mg by mouth daily.   apixaban 5 MG Tabs tablet Commonly known as: ELIQUIS Take 2 tablets (10 mg total) by mouth 2 (two) times daily. For 7 days. Then 1 tablet two times daily starting 07/30/21   calcium-vitamin D 500-200 MG-UNIT tablet Commonly known as: OSCAL WITH D Take 1 tablet by mouth every morning.   carvedilol 25 MG tablet Commonly known as: COREG Take 25 mg by mouth  2 (two) times daily with a meal.   fish oil-omega-3 fatty acids 1000 MG capsule Take 1 g by mouth every morning.   MULTIVITAMIN ADULT PO Take 1 tablet by mouth daily. One a day womens   rosuvastatin 10 MG tablet Commonly known as: CRESTOR Take 10 mg by mouth  daily.   vitamin C 1000 MG tablet Take 1,000 mg by mouth every morning.               Durable Medical Equipment  (From admission, onward)           Start     Ordered   07/23/21 1127  For home use only DME Walker rolling  Once       Question Answer Comment  Walker: With 5 Inch Wheels   Patient needs a walker to treat with the following condition Gait instability      07/23/21 1127            Follow-up Information     [facility] Follow up.   Specialty: Home Health Services Why: PT Contact information: [address] [place] [address] [phone]         [facility] Follow up.   Why: Walker Contact information: [id number] [place] [address] [phone]                Allergies  Allergen Reactions   Sitagliptin Phosphate     REACTION: Finger and hand tingling RUQ pain    Consultations: none   Procedures/Studies: DG Chest 2 View  Result Date: 07/19/2021 CLINICAL DATA:  Cough and shortness of breath x1 week. EXAM: CHEST - 2 VIEW COMPARISON:  November 28, 2018 FINDINGS: Mild atelectasis and/or early infiltrate is seen within the mid right lung and left lung base. There is no evidence of a pleural effusion or pneumothorax. The cardiac silhouette is moderately enlarged and unchanged in size. There is stable prominence of the right hilar pulmonary vasculature. Marked severity calcification of the aortic arch is seen with marked severity tortuosity of the descending thoracic aorta. The visualized skeletal structures are unremarkable. IMPRESSION: Stable cardiomegaly with mild mid right lung and left basilar atelectasis and/or infiltrate. Electronically Signed   By: Virgina Norfolk M.D.   On: 07/19/2021 19:18   CT Angio Chest PE W and/or Wo Contrast  Result Date: 07/20/2021 CLINICAL DATA:  87 year old female with concern for pulmonary embolism. EXAM: CT ANGIOGRAPHY CHEST WITH CONTRAST  TECHNIQUE: Multidetector CT imaging of the chest was performed using the standard protocol during bolus administration of intravenous contrast. Multiplanar CT image reconstructions and MIPs were obtained to evaluate the vascular anatomy. CONTRAST:  128m OMNIPAQUE IOHEXOL 350 MG/ML SOLN COMPARISON:  Chest radiograph dated 07/19/2021. FINDINGS: Cardiovascular: There is mild cardiomegaly. There is dilatation of the right atrium with retrograde flow of contrast from the right atrium into the IVC indicative right heart dysfunction. Correlation with echocardiogram recommended. No pericardial effusion. There is coronary vascular calcification. Moderate atherosclerotic calcification of the thoracic aorta. No aneurysmal dilatation. Evaluation of the aorta is limited due to suboptimal opacification and timing of the contrast. There is a left-sided aortic arch with aberrant right subclavian artery anatomy. Large bilateral pulmonary artery emboli involving the lobar branches extending into the segmental and subsegmental branches. There is large clot burden in the right lower lobe. No CT evidence of right heart straining. The right ventricular lumen measures approximately 4 cm and the left ventricular lumen measures 5.4 cm  for a ratio of 0.7. Mediastinum/Nodes: No hilar or mediastinal adenopathy. The esophagus is grossly unremarkable. No mediastinal fluid collection. Lungs/Pleura: Patchy area of ground-glass opacity involving the left lung base may represent atelectasis or infiltrate 6. Developing infarct is not excluded. There is no pleural effusion pneumothorax. The central airways are patent. Upper Abdomen: No acute abnormality. Musculoskeletal: Degenerative changes of the spine and osteopenia. No acute osseous pathology. Review of the MIP images confirms the above findings. IMPRESSION: 1. Large bilateral pulmonary artery emboli involving the lobar branches extending into the segmental and subsegmental branches. No CT  evidence of right heart straining. 2. Left lung base atelectasis versus infiltrate. Developing infarct is not excluded. 3. Mild cardiomegaly with evidence of right heart dysfunction. Correlation with echocardiogram recommended. 4. Left-sided aortic arch with aberrant right subclavian artery anatomy. 5. Aortic Atherosclerosis (ICD10-I70.0). These results were called by telephone at the time of interpretation on 07/19/2021 at 11:59 pm to provider [facility] , who verbally acknowledged these results. Electronically Signed   By: Anner Crete M.D.   On: 07/20/2021 00:02   US Venous Img Lower Bilateral (DVT)  Result Date: 07/20/2021 CLINICAL DATA:  Pulmonary embolism. Shortness of breath for 2 weeks. EXAM: BILATERAL LOWER EXTREMITY VENOUS DOPPLER ULTRASOUND TECHNIQUE: Gray-scale sonography with graded compression, as well as color Doppler and duplex ultrasound were performed to evaluate the lower extremity deep venous systems from the level of the common femoral vein and including the common femoral, femoral, profunda femoral, popliteal and calf veins including the posterior tibial, peroneal and gastrocnemius veins when visible. The superficial great saphenous vein was also interrogated. Spectral Doppler was utilized to evaluate flow at rest and with distal augmentation maneuvers in the common femoral, femoral and popliteal veins. COMPARISON:  None. FINDINGS: RIGHT LOWER EXTREMITY Common Femoral Vein: No evidence of thrombus. Normal compressibility, respiratory phasicity and response to augmentation. Saphenofemoral Junction: No evidence of thrombus. Normal compressibility and flow on color Doppler imaging. Profunda Femoral Vein: No evidence of thrombus. Normal compressibility and flow on color Doppler imaging. Femoral Vein: No evidence of thrombus. Normal compressibility, respiratory phasicity and response to augmentation. Popliteal Vein: Occlusive thrombus identified within the posterior tibial vein. Calf  Veins: No evidence of thrombus. Normal compressibility and flow on color Doppler imaging. Superficial Great Saphenous Vein: No evidence of thrombus. Normal compressibility. Venous Reflux:  None. Other Findings:  None. LEFT LOWER EXTREMITY Common Femoral Vein: No evidence of thrombus. Normal compressibility, respiratory phasicity and response to augmentation. Saphenofemoral Junction: No evidence of thrombus. Normal compressibility and flow on color Doppler imaging. Profunda Femoral Vein: No evidence of thrombus. Normal compressibility and flow on color Doppler imaging. Femoral Vein: Occlusive thrombus noted. Popliteal Vein: Appears normal. Calf Veins: Appears normal Superficial Great Saphenous Vein: No evidence of thrombus. Normal compressibility. Venous Reflux:  None. Other Findings:  None. IMPRESSION: Exam positive for deep vein thrombi within the right posterior tibial vein and left femoral vein. Electronically Signed   By: Kerby Moors M.D.   On: 07/20/2021 12:24   ECHOCARDIOGRAM COMPLETE  Result Date: 07/20/2021    ECHOCARDIOGRAM REPORT   Patient Name:   Rachel Copeland [facility] Date of Exam: 07/20/2021 Medical Rec #:  [place]:[id number]           Height:       65.0 in Accession #:    [id number]          Weight:       193.6 lb Date of Birth:  12/24/1929  BSA:          1.951 m Patient Age:    89 years            BP:           130/60 mmHg Patient Gender: F                   HR:           65 bpm. Exam Location:  [facility] Procedure: 2D Echo, Cardiac Doppler and Color Doppler Indications:    Pulmonary embolus  History:        Patient has prior history of Echocardiogram examinations, most                 recent 12/02/2014. CHF; Risk Factors:Hypertension, Diabetes and                 Dyslipidemia.  Sonographer:    Wenda Low Referring Phys: [id number] ASIA B [place]  1. Left ventricular ejection fraction, by estimation, is 50 to 55%. The left ventricle has low normal function. The left ventricle  has no regional wall motion abnormalities. There is severe left ventricular hypertrophy. Left ventricular diastolic parameters are indeterminate.  2. The ventricular septum is flattened in diastole and systole suggesting RV pressure and volume overload. . Right ventricular systolic function is moderately reduced. The right ventricular size is severely enlarged. There is severely elevated pulmonary  artery systolic pressure.  3. Right atrial size was severely dilated.  4. A small pericardial effusion is present. The pericardial effusion is circumferential.  5. The mitral valve is normal in structure. No evidence of mitral valve regurgitation. No evidence of mitral stenosis.  6. The tricuspid valve is abnormal. Tricuspid valve regurgitation is moderate.  7. The aortic valve is tricuspid. There is mild calcification of the aortic valve. There is mild thickening of the aortic valve. Aortic valve regurgitation is mild. No aortic stenosis is present.  8. The inferior vena cava is normal in size with <50% respiratory variability, suggesting right atrial pressure of 8 mmHg.  9. Severe left ventricular hypertrophy with somewhat of a speckled appearance, diffuse valve thickening, pericardial effusion. Findings may suggest an infiltrative process such as cardiac amyloidosis, consider outpatient cardiac MRI. FINDINGS  Left Ventricle: Left ventricular ejection fraction, by estimation, is 50 to 55%. The left ventricle has low normal function. The left ventricle has no regional wall motion abnormalities. The left ventricular internal cavity size was normal in size. There is severe left ventricular hypertrophy. Left ventricular diastolic parameters are indeterminate. Right Ventricle: The ventricular septum is flattened in diastole and systole suggesting RV pressure and volume overload. The right ventricular size is severely enlarged. Right vetricular wall thickness was not assessed. Right ventricular systolic function is moderately  reduced. There is severely elevated pulmonary artery systolic pressure. The tricuspid regurgitant velocity is 4.06 m/s, and with an assumed right atrial pressure of 8 mmHg, the estimated right ventricular systolic pressure is [id number]. Left Atrium: Left atrial size was normal in size. Right Atrium: Right atrial size was severely dilated. Pericardium: A small pericardial effusion is present. The pericardial effusion is circumferential. Mitral Valve: The mitral valve is normal in structure. There is mild thickening of the mitral valve leaflet(s). There is mild calcification of the mitral valve leaflet(s). Mild mitral annular calcification. No evidence of mitral valve regurgitation. No evidence of mitral valve stenosis. MV peak gradient, 5.8 mmHg. The mean mitral valve gradient is 2.0 mmHg. Tricuspid Valve: The  tricuspid valve is abnormal. Tricuspid valve regurgitation is moderate . No evidence of tricuspid stenosis. Aortic Valve: The aortic valve is tricuspid. There is mild calcification of the aortic valve. There is mild thickening of the aortic valve. There is mild aortic valve annular calcification. Aortic valve regurgitation is mild. No aortic stenosis is present. Aortic valve mean gradient measures 3.0 mmHg. Aortic valve peak gradient measures 5.1 mmHg. Aortic valve area, by VTI measures 2.66 cm. Pulmonic Valve: The pulmonic valve was not well visualized. Pulmonic valve regurgitation is mild. No evidence of pulmonic stenosis. Aorta: The aortic root is normal in size and structure. Venous: The inferior vena cava is normal in size with less than 50% respiratory variability, suggesting right atrial pressure of 8 mmHg. IAS/Shunts: No atrial level shunt detected by color flow Doppler.  LEFT VENTRICLE PLAX 2D LVIDd:         3.19 cm     Diastology LVIDs:         2.24 cm     LV e' medial:    4.03 cm/s LV PW:         1.70 cm     LV E/e' medial:  11.4 LV IVS:        1.93 cm     LV e' lateral:   9.29 cm/s LVOT diam:      2.00 cm     LV E/e' lateral: 4.9 LV SV:         68 LV SV Index:   35 LVOT Area:     3.14 cm  LV Volumes (MOD) LV vol d, MOD A2C: 45.4 ml LV vol d, MOD A4C: 70.2 ml LV vol s, MOD A2C: 30.1 ml LV vol s, MOD A4C: 32.1 ml LV SV MOD A2C:     15.3 ml LV SV MOD A4C:     70.2 ml LV SV MOD BP:      28.2 ml RIGHT VENTRICLE RV Basal diam:  3.63 cm RV Mid diam:    4.32 cm RV S prime:     7.45 cm/s TAPSE (M-mode): 1.5 cm LEFT ATRIUM             Index       RIGHT ATRIUM           Index LA diam:        4.60 cm 2.36 cm/m  RA Area:     28.60 cm LA Vol (A2C):   69.7 ml 35.73 ml/m RA Volume:   105.00 ml 53.82 ml/m LA Vol (A4C):   50.5 ml 25.89 ml/m LA Biplane Vol: 59.4 ml 30.45 ml/m  AORTIC VALVE AV Area (Vmax):    2.56 cm AV Area (Vmean):   2.34 cm AV Area (VTI):     2.66 cm AV Vmax:           113.00 cm/s AV Vmean:          78.500 cm/s AV VTI:            0.257 m AV Peak Grad:      5.1 mmHg AV Mean Grad:      3.0 mmHg LVOT Vmax:         92.00 cm/s LVOT Vmean:        58.500 cm/s LVOT VTI:          0.218 m LVOT/AV VTI ratio: 0.85  AORTA Ao Root diam: 3.20 cm Ao Asc diam:  3.30 cm MITRAL VALVE  TRICUSPID VALVE MV Area (PHT): 3.15 cm     TR Peak grad:   65.9 mmHg MV Area VTI:   2.17 cm     TR Vmax:        406.00 cm/s MV Peak grad:  5.8 mmHg MV Mean grad:  2.0 mmHg     SHUNTS MV Vmax:       1.20 m/s     Systemic VTI:  0.22 m MV Vmean:      57.4 cm/s    Systemic Diam: 2.00 cm MV Decel Time: 241 msec MV E velocity: 45.80 cm/s MV A velocity: 113.00 cm/s MV E/A ratio:  0.41 Carlyle Dolly MD Electronically signed by Carlyle Dolly MD Signature Date/Time: 07/20/2021/3:46:24 PM    Final         Discharge Exam: Vitals:   07/23/21 0452 07/23/21 0910  BP: (!) 160/65   Pulse: (!) 52 65  Resp: 19   Temp: 97.7 F (36.5 C)   SpO2: 96%    Vitals:   07/22/21 2022 07/22/21 2100 07/23/21 0452 07/23/21 0910  BP:  (!) 142/55 (!) 160/65   Pulse:  (!) 55 (!) 52 65  Resp:  18 19   Temp:  97.7 F (36.5 C) 97.7 F  (36.5 C)   TempSrc:  Oral Oral   SpO2: 95% 98% 96%   Weight:      Height:        General: Pt is alert, awake, not in acute distress Cardiovascular: RRR, S1/S2 +, no rubs, no gallops Respiratory: bibasilar rales. No wheeze Abdominal: Soft, NT, ND, bowel sounds + Extremities: 1 + LE edema, no cyanosis   The results of significant diagnostics from this hospitalization (including imaging, microbiology, ancillary and laboratory) are listed below for reference.    Significant Diagnostic Studies: DG Chest 2 View  Result Date: 07/19/2021 CLINICAL DATA:  Cough and shortness of breath x1 week. EXAM: CHEST - 2 VIEW COMPARISON:  November 28, 2018 FINDINGS: Mild atelectasis and/or early infiltrate is seen within the mid right lung and left lung base. There is no evidence of a pleural effusion or pneumothorax. The cardiac silhouette is moderately enlarged and unchanged in size. There is stable prominence of the right hilar pulmonary vasculature. Marked severity calcification of the aortic arch is seen with marked severity tortuosity of the descending thoracic aorta. The visualized skeletal structures are unremarkable. IMPRESSION: Stable cardiomegaly with mild mid right lung and left basilar atelectasis and/or infiltrate. Electronically Signed   By: Virgina Norfolk M.D.   On: 07/19/2021 19:18   CT Angio Chest PE W and/or Wo Contrast  Result Date: 07/20/2021 CLINICAL DATA:  87 year old female with concern for pulmonary embolism. EXAM: CT ANGIOGRAPHY CHEST WITH CONTRAST TECHNIQUE: Multidetector CT imaging of the chest was performed using the standard protocol during bolus administration of intravenous contrast. Multiplanar CT image reconstructions and MIPs were obtained to evaluate the vascular anatomy. CONTRAST:  151m OMNIPAQUE IOHEXOL 350 MG/ML SOLN COMPARISON:  Chest radiograph dated 07/19/2021. FINDINGS: Cardiovascular: There is mild cardiomegaly. There is dilatation of the right atrium with retrograde  flow of contrast from the right atrium into the IVC indicative right heart dysfunction. Correlation with echocardiogram recommended. No pericardial effusion. There is coronary vascular calcification. Moderate atherosclerotic calcification of the thoracic aorta. No aneurysmal dilatation. Evaluation of the aorta is limited due to suboptimal opacification and timing of the contrast. There is a left-sided aortic arch with aberrant right subclavian artery anatomy. Large bilateral pulmonary artery emboli involving the lobar branches  extending into the segmental and subsegmental branches. There is large clot burden in the right lower lobe. No CT evidence of right heart straining. The right ventricular lumen measures approximately 4 cm and the left ventricular lumen measures 5.4 cm for a ratio of 0.7. Mediastinum/Nodes: No hilar or mediastinal adenopathy. The esophagus is grossly unremarkable. No mediastinal fluid collection. Lungs/Pleura: Patchy area of ground-glass opacity involving the left lung base may represent atelectasis or infiltrate 6. Developing infarct is not excluded. There is no pleural effusion pneumothorax. The central airways are patent. Upper Abdomen: No acute abnormality. Musculoskeletal: Degenerative changes of the spine and osteopenia. No acute osseous pathology. Review of the MIP images confirms the above findings. IMPRESSION: 1. Large bilateral pulmonary artery emboli involving the lobar branches extending into the segmental and subsegmental branches. No CT evidence of right heart straining. 2. Left lung base atelectasis versus infiltrate. Developing infarct is not excluded. 3. Mild cardiomegaly with evidence of right heart dysfunction. Correlation with echocardiogram recommended. 4. Left-sided aortic arch with aberrant right subclavian artery anatomy. 5. Aortic Atherosclerosis (ICD10-I70.0). These results were called by telephone at the time of interpretation on 07/19/2021 at 11:59 pm to provider  [facility] , who verbally acknowledged these results. Electronically Signed   By: Anner Crete M.D.   On: 07/20/2021 00:02   US Venous Img Lower Bilateral (DVT)  Result Date: 07/20/2021 CLINICAL DATA:  Pulmonary embolism. Shortness of breath for 2 weeks. EXAM: BILATERAL LOWER EXTREMITY VENOUS DOPPLER ULTRASOUND TECHNIQUE: Gray-scale sonography with graded compression, as well as color Doppler and duplex ultrasound were performed to evaluate the lower extremity deep venous systems from the level of the common femoral vein and including the common femoral, femoral, profunda femoral, popliteal and calf veins including the posterior tibial, peroneal and gastrocnemius veins when visible. The superficial great saphenous vein was also interrogated. Spectral Doppler was utilized to evaluate flow at rest and with distal augmentation maneuvers in the common femoral, femoral and popliteal veins. COMPARISON:  None. FINDINGS: RIGHT LOWER EXTREMITY Common Femoral Vein: No evidence of thrombus. Normal compressibility, respiratory phasicity and response to augmentation. Saphenofemoral Junction: No evidence of thrombus. Normal compressibility and flow on color Doppler imaging. Profunda Femoral Vein: No evidence of thrombus. Normal compressibility and flow on color Doppler imaging. Femoral Vein: No evidence of thrombus. Normal compressibility, respiratory phasicity and response to augmentation. Popliteal Vein: Occlusive thrombus identified within the posterior tibial vein. Calf Veins: No evidence of thrombus. Normal compressibility and flow on color Doppler imaging. Superficial Great Saphenous Vein: No evidence of thrombus. Normal compressibility. Venous Reflux:  None. Other Findings:  None. LEFT LOWER EXTREMITY Common Femoral Vein: No evidence of thrombus. Normal compressibility, respiratory phasicity and response to augmentation. Saphenofemoral Junction: No evidence of thrombus. Normal compressibility and flow on  color Doppler imaging. Profunda Femoral Vein: No evidence of thrombus. Normal compressibility and flow on color Doppler imaging. Femoral Vein: Occlusive thrombus noted. Popliteal Vein: Appears normal. Calf Veins: Appears normal Superficial Great Saphenous Vein: No evidence of thrombus. Normal compressibility. Venous Reflux:  None. Other Findings:  None. IMPRESSION: Exam positive for deep vein thrombi within the right posterior tibial vein and left femoral vein. Electronically Signed   By: Kerby Moors M.D.   On: 07/20/2021 12:24   ECHOCARDIOGRAM COMPLETE  Result Date: 07/20/2021    ECHOCARDIOGRAM REPORT   Patient Name:   KAILYNNE HOGWOOD [facility] Date of Exam: 07/20/2021 Medical Rec #:  [place]:[id number]           Height:  65.0 in Accession #:    [id number]          Weight:       193.6 lb Date of Birth:  June 12, 1929           BSA:          1.951 m Patient Age:    12 years            BP:           130/60 mmHg Patient Gender: F                   HR:           65 bpm. Exam Location:  [facility] Procedure: 2D Echo, Cardiac Doppler and Color Doppler Indications:    Pulmonary embolus  History:        Patient has prior history of Echocardiogram examinations, most                 recent 12/02/2014. CHF; Risk Factors:Hypertension, Diabetes and                 Dyslipidemia.  Sonographer:    Wenda Low Referring Phys: [id number] ASIA B [place]  1. Left ventricular ejection fraction, by estimation, is 50 to 55%. The left ventricle has low normal function. The left ventricle has no regional wall motion abnormalities. There is severe left ventricular hypertrophy. Left ventricular diastolic parameters are indeterminate.  2. The ventricular septum is flattened in diastole and systole suggesting RV pressure and volume overload. . Right ventricular systolic function is moderately reduced. The right ventricular size is severely enlarged. There is severely elevated pulmonary  artery systolic pressure.  3. Right atrial  size was severely dilated.  4. A small pericardial effusion is present. The pericardial effusion is circumferential.  5. The mitral valve is normal in structure. No evidence of mitral valve regurgitation. No evidence of mitral stenosis.  6. The tricuspid valve is abnormal. Tricuspid valve regurgitation is moderate.  7. The aortic valve is tricuspid. There is mild calcification of the aortic valve. There is mild thickening of the aortic valve. Aortic valve regurgitation is mild. No aortic stenosis is present.  8. The inferior vena cava is normal in size with <50% respiratory variability, suggesting right atrial pressure of 8 mmHg.  9. Severe left ventricular hypertrophy with somewhat of a speckled appearance, diffuse valve thickening, pericardial effusion. Findings may suggest an infiltrative process such as cardiac amyloidosis, consider outpatient cardiac MRI. FINDINGS  Left Ventricle: Left ventricular ejection fraction, by estimation, is 50 to 55%. The left ventricle has low normal function. The left ventricle has no regional wall motion abnormalities. The left ventricular internal cavity size was normal in size. There is severe left ventricular hypertrophy. Left ventricular diastolic parameters are indeterminate. Right Ventricle: The ventricular septum is flattened in diastole and systole suggesting RV pressure and volume overload. The right ventricular size is severely enlarged. Right vetricular wall thickness was not assessed. Right ventricular systolic function is moderately reduced. There is severely elevated pulmonary artery systolic pressure. The tricuspid regurgitant velocity is 4.06 m/s, and with an assumed right atrial pressure of 8 mmHg, the estimated right ventricular systolic pressure is [id number]. Left Atrium: Left atrial size was normal in size. Right Atrium: Right atrial size was severely dilated. Pericardium: A small pericardial effusion is present. The pericardial effusion is circumferential.  Mitral Valve: The mitral valve is normal in structure. There is mild thickening of the mitral valve leaflet(s).  There is mild calcification of the mitral valve leaflet(s). Mild mitral annular calcification. No evidence of mitral valve regurgitation. No evidence of mitral valve stenosis. MV peak gradient, 5.8 mmHg. The mean mitral valve gradient is 2.0 mmHg. Tricuspid Valve: The tricuspid valve is abnormal. Tricuspid valve regurgitation is moderate . No evidence of tricuspid stenosis. Aortic Valve: The aortic valve is tricuspid. There is mild calcification of the aortic valve. There is mild thickening of the aortic valve. There is mild aortic valve annular calcification. Aortic valve regurgitation is mild. No aortic stenosis is present. Aortic valve mean gradient measures 3.0 mmHg. Aortic valve peak gradient measures 5.1 mmHg. Aortic valve area, by VTI measures 2.66 cm. Pulmonic Valve: The pulmonic valve was not well visualized. Pulmonic valve regurgitation is mild. No evidence of pulmonic stenosis. Aorta: The aortic root is normal in size and structure. Venous: The inferior vena cava is normal in size with less than 50% respiratory variability, suggesting right atrial pressure of 8 mmHg. IAS/Shunts: No atrial level shunt detected by color flow Doppler.  LEFT VENTRICLE PLAX 2D LVIDd:         3.19 cm     Diastology LVIDs:         2.24 cm     LV e' medial:    4.03 cm/s LV PW:         1.70 cm     LV E/e' medial:  11.4 LV IVS:        1.93 cm     LV e' lateral:   9.29 cm/s LVOT diam:     2.00 cm     LV E/e' lateral: 4.9 LV SV:         68 LV SV Index:   35 LVOT Area:     3.14 cm  LV Volumes (MOD) LV vol d, MOD A2C: 45.4 ml LV vol d, MOD A4C: 70.2 ml LV vol s, MOD A2C: 30.1 ml LV vol s, MOD A4C: 32.1 ml LV SV MOD A2C:     15.3 ml LV SV MOD A4C:     70.2 ml LV SV MOD BP:      28.2 ml RIGHT VENTRICLE RV Basal diam:  3.63 cm RV Mid diam:    4.32 cm RV S prime:     7.45 cm/s TAPSE (M-mode): 1.5 cm LEFT ATRIUM              Index       RIGHT ATRIUM           Index LA diam:        4.60 cm 2.36 cm/m  RA Area:     28.60 cm LA Vol (A2C):   69.7 ml 35.73 ml/m RA Volume:   105.00 ml 53.82 ml/m LA Vol (A4C):   50.5 ml 25.89 ml/m LA Biplane Vol: 59.4 ml 30.45 ml/m  AORTIC VALVE AV Area (Vmax):    2.56 cm AV Area (Vmean):   2.34 cm AV Area (VTI):     2.66 cm AV Vmax:           113.00 cm/s AV Vmean:          78.500 cm/s AV VTI:            0.257 m AV Peak Grad:      5.1 mmHg AV Mean Grad:      3.0 mmHg LVOT Vmax:         92.00 cm/s LVOT Vmean:        58.500  cm/s LVOT VTI:          0.218 m LVOT/AV VTI ratio: 0.85  AORTA Ao Root diam: 3.20 cm Ao Asc diam:  3.30 cm MITRAL VALVE                TRICUSPID VALVE MV Area (PHT): 3.15 cm     TR Peak grad:   65.9 mmHg MV Area VTI:   2.17 cm     TR Vmax:        406.00 cm/s MV Peak grad:  5.8 mmHg MV Mean grad:  2.0 mmHg     SHUNTS MV Vmax:       1.20 m/s     Systemic VTI:  0.22 m MV Vmean:      57.4 cm/s    Systemic Diam: 2.00 cm MV Decel Time: 241 msec MV E velocity: 45.80 cm/s MV A velocity: 113.00 cm/s MV E/A ratio:  0.41 Carlyle Dolly MD Electronically signed by Carlyle Dolly MD Signature Date/Time: 07/20/2021/3:46:24 PM    Final     Microbiology: Recent Results (from the past 240 hour(s))  Resp Panel by RT-PCR (Flu A&B, Covid) Nasopharyngeal Swab     Status: None   Collection Time: 07/19/21  8:58 PM   Specimen: Nasopharyngeal Swab; Nasopharyngeal(NP) swabs in vial transport medium  Result Value Ref Range Status   SARS Coronavirus 2 by RT PCR NEGATIVE NEGATIVE Final    Comment: (NOTE) SARS-CoV-2 target nucleic acids are NOT DETECTED.  The SARS-CoV-2 RNA is generally detectable in upper respiratory specimens during the acute phase of infection. The lowest concentration of SARS-CoV-2 viral copies this assay can detect is 138 copies/mL. A negative result does not preclude SARS-Cov-2 infection and should not be used as the sole basis for treatment or other patient management  decisions. A negative result may occur with  improper specimen collection/handling, submission of specimen other than nasopharyngeal swab, presence of viral mutation(s) within the areas targeted by this assay, and inadequate number of viral copies(<138 copies/mL). A negative result must be combined with clinical observations, patient history, and epidemiological information. The expected result is Negative.  Fact Sheet for Patients:  EntrepreneurPulse.com.au  Fact Sheet for Healthcare Providers:  IncredibleEmployment.be  This test is no t yet approved or cleared by the [place] FDA and  has been authorized for detection and/or diagnosis of SARS-CoV-2 by FDA under an Emergency Use Authorization (EUA). This EUA will remain  in effect (meaning this test can be used) for the duration of the COVID-19 declaration under Section 564(b)(1) of the Act, 21 U.S.C.section 360bbb-3(b)(1), unless the authorization is terminated  or revoked sooner.       Influenza A by PCR NEGATIVE NEGATIVE Final   Influenza B by PCR NEGATIVE NEGATIVE Final    Comment: (NOTE) The Xpert Xpress SARS-CoV-2/FLU/RSV plus assay is intended as an aid in the diagnosis of influenza from Nasopharyngeal swab specimens and should not be used as a sole basis for treatment. Nasal washings and aspirates are unacceptable for Xpert Xpress SARS-CoV-2/FLU/RSV testing.  Fact Sheet for Patients: EntrepreneurPulse.com.au  Fact Sheet for Healthcare Providers: IncredibleEmployment.be  This test is not yet approved or cleared by the [place] FDA and has been authorized for detection and/or diagnosis of SARS-CoV-2 by FDA under an Emergency Use Authorization (EUA). This EUA will remain in effect (meaning this test can be used) for the duration of the COVID-19 declaration under Section 564(b)(1) of the Act, 21 U.S.C. section 360bbb-3(b)(1), unless the  authorization is terminated or  revoked.  Performed at [facility], [address] [place], [place] [address]      Labs: Basic Metabolic Panel: Recent Labs  Lab 07/19/21 1835 07/21/21 0555 07/22/21 0749 07/23/21 0521  NA 136 141 139 140  K 3.5 3.1* 3.3* 3.4*  CL 105 110 109 109  CO2 '24 23 24 24  '$ GLUCOSE 155* 103* 96 103*  BUN '20 18 19 17  '$ CREATININE 1.03* 0.95 1.04* 0.87  CALCIUM 9.3 9.0 8.8* 9.3  MG  --  2.1  --  2.1   Liver Function Tests: Recent Labs  Lab 07/21/21 0555  AST 20  ALT 33  ALKPHOS 73  BILITOT 0.5  PROT 5.9*  ALBUMIN 2.8*   No results for input(s): LIPASE, AMYLASE in the last 168 hours. No results for input(s): AMMONIA in the last 168 hours. CBC: Recent Labs  Lab 07/19/21 1835 07/21/21 0555 07/22/21 0749 07/23/21 0521  WBC 5.6 4.4 4.6 3.9*  NEUTROABS  --  2.4  --   --   HGB 11.9* 11.6* 10.4* 10.7*  HCT 37.0 36.6 33.0* 34.3*  MCV 81.1 82.1 80.9 82.1  PLT 153 134* 140* 135*   Cardiac Enzymes: No results for input(s): CKTOTAL, CKMB, CKMBINDEX, TROPONINI in the last 168 hours. BNP: Invalid input(s): POCBNP CBG: Recent Labs  Lab 07/22/21 1132 07/22/21 1725 07/22/21 2138 07/23/21 0753 07/23/21 1136  GLUCAP 173* 133* 144* 112* 166*    Time coordinating discharge:  36 minutes  Signed:  Orson Eva, DO [facility] Pager: [phone] 07/23/2021, 12:16 PM

## 2021-07-23 NOTE — Plan of Care (Signed)
  Problem: Education: Goal: Knowledge of General Education information will improve Description: Including pain rating scale, medication(s)/side effects and non-pharmacologic comfort measures 07/23/2021 1305 by Santa Lighter, RN Outcome: Adequate for Discharge 07/23/2021 1305 by Santa Lighter, RN Outcome: Progressing 07/23/2021 0914 by Santa Lighter, RN Outcome: Progressing   Problem: Health Behavior/Discharge Planning: Goal: Ability to manage health-related needs will improve 07/23/2021 1305 by Santa Lighter, RN Outcome: Adequate for Discharge 07/23/2021 1305 by Santa Lighter, RN Outcome: Progressing 07/23/2021 0914 by Santa Lighter, RN Outcome: Progressing   Problem: Clinical Measurements: Goal: Ability to maintain clinical measurements within normal limits will improve 07/23/2021 1305 by Santa Lighter, RN Outcome: Adequate for Discharge 07/23/2021 1305 by Santa Lighter, RN Outcome: Progressing Goal: Will remain free from infection 07/23/2021 1305 by Santa Lighter, RN Outcome: Adequate for Discharge 07/23/2021 1305 by Santa Lighter, RN Outcome: Progressing Goal: Diagnostic test results will improve 07/23/2021 1305 by Santa Lighter, RN Outcome: Adequate for Discharge 07/23/2021 1305 by Santa Lighter, RN Outcome: Progressing Goal: Respiratory complications will improve 07/23/2021 1305 by Santa Lighter, RN Outcome: Adequate for Discharge 07/23/2021 1305 by Santa Lighter, RN Outcome: Progressing Goal: Cardiovascular complication will be avoided 07/23/2021 1305 by Santa Lighter, RN Outcome: Adequate for Discharge 07/23/2021 1305 by Santa Lighter, RN Outcome: Progressing   Problem: Activity: Goal: Risk for activity intolerance will decrease 07/23/2021 1305 by Santa Lighter, RN Outcome: Adequate for Discharge 07/23/2021 1305 by Santa Lighter, RN Outcome: Progressing   Problem: Nutrition: Goal: Adequate nutrition will be maintained 07/23/2021 1305 by Santa Lighter,  RN Outcome: Adequate for Discharge 07/23/2021 1305 by Santa Lighter, RN Outcome: Progressing   Problem: Coping: Goal: Level of anxiety will decrease 07/23/2021 1305 by Santa Lighter, RN Outcome: Adequate for Discharge 07/23/2021 1305 by Santa Lighter, RN Outcome: Progressing   Problem: Safety: Goal: Ability to remain free from injury will improve 07/23/2021 1305 by Santa Lighter, RN Outcome: Adequate for Discharge 07/23/2021 1305 by Santa Lighter, RN Outcome: Progressing   Problem: Pain Managment: Goal: General experience of comfort will improve 07/23/2021 1305 by Santa Lighter, RN Outcome: Adequate for Discharge 07/23/2021 1305 by Santa Lighter, RN Outcome: Progressing   Problem: Elimination: Goal: Will not experience complications related to bowel motility 07/23/2021 1305 by Santa Lighter, RN Outcome: Adequate for Discharge 07/23/2021 1305 by Santa Lighter, RN Outcome: Progressing Goal: Will not experience complications related to urinary retention 07/23/2021 1305 by Santa Lighter, RN Outcome: Adequate for Discharge 07/23/2021 1305 by Santa Lighter, RN Outcome: Progressing

## 2021-07-23 NOTE — Discharge Instructions (Signed)
Information on my medicine - ELIQUIS (apixaban)  This medication education was reviewed with me or my healthcare representative as part of my discharge preparation.    Why was Eliquis prescribed for you? Eliquis was prescribed to treat blood clots that may have been found in the veins of your legs (deep vein thrombosis) or in your lungs (pulmonary embolism) and to reduce the risk of them occurring again.  What do You need to know about Eliquis ? The starting dose is 10 mg (two 5 mg tablets) taken TWICE daily for the FIRST SEVEN (7) DAYS, then on 07/30/2021 the dose is reduced to ONE 5 mg tablet taken TWICE daily.  Eliquis may be taken with or without food.   Try to take the dose about the same time in the morning and in the evening. If you have difficulty swallowing the tablet whole please discuss with your pharmacist how to take the medication safely.  Take Eliquis exactly as prescribed and DO NOT stop taking Eliquis without talking to the doctor who prescribed the medication.  Stopping may increase your risk of developing a new blood clot.  Refill your prescription before you run out.  After discharge, you should have regular check-up appointments with your healthcare provider that is prescribing your Eliquis.    What do you do if you miss a dose? If a dose of ELIQUIS is not taken at the scheduled time, take it as soon as possible on the same day and twice-daily administration should be resumed. The dose should not be doubled to make up for a missed dose.  Important Safety Information A possible side effect of Eliquis is bleeding. You should call your healthcare provider right away if you experience any of the following: Bleeding from an injury or your nose that does not stop. Unusual colored urine (red or dark brown) or unusual colored stools (red or black). Unusual bruising for unknown reasons. A serious fall or if you hit your head (even if there is no bleeding).  Some  medicines may interact with Eliquis and might increase your risk of bleeding or clotting while on Eliquis. To help avoid this, consult your healthcare provider or pharmacist prior to using any new prescription or non-prescription medications, including herbals, vitamins, non-steroidal anti-inflammatory drugs (NSAIDs) and supplements.  This website has more information on Eliquis (apixaban): http://www.eliquis.com/eliquis/home

## 2021-07-23 NOTE — Plan of Care (Signed)
  Problem: Education: Goal: Knowledge of General Education information will improve Description Including pain rating scale, medication(s)/side effects and non-pharmacologic comfort measures Outcome: Progressing   Problem: Health Behavior/Discharge Planning: Goal: Ability to manage health-related needs will improve Outcome: Progressing   

## 2021-07-26 DIAGNOSIS — I313 Pericardial effusion (noninflammatory): Secondary | ICD-10-CM | POA: Diagnosis not present

## 2021-07-26 DIAGNOSIS — I2699 Other pulmonary embolism without acute cor pulmonale: Secondary | ICD-10-CM | POA: Diagnosis not present

## 2021-07-26 DIAGNOSIS — I700 Atherosclerosis of aorta: Secondary | ICD-10-CM | POA: Diagnosis not present

## 2021-07-26 DIAGNOSIS — J9601 Acute respiratory failure with hypoxia: Secondary | ICD-10-CM | POA: Diagnosis not present

## 2021-07-26 DIAGNOSIS — Z9181 History of falling: Secondary | ICD-10-CM | POA: Diagnosis not present

## 2021-07-26 DIAGNOSIS — N1832 Chronic kidney disease, stage 3b: Secondary | ICD-10-CM | POA: Diagnosis not present

## 2021-07-26 DIAGNOSIS — Z7901 Long term (current) use of anticoagulants: Secondary | ICD-10-CM | POA: Diagnosis not present

## 2021-07-26 DIAGNOSIS — Z86711 Personal history of pulmonary embolism: Secondary | ICD-10-CM | POA: Diagnosis not present

## 2021-07-26 DIAGNOSIS — Z7722 Contact with and (suspected) exposure to environmental tobacco smoke (acute) (chronic): Secondary | ICD-10-CM | POA: Diagnosis not present

## 2021-07-26 DIAGNOSIS — I2694 Multiple subsegmental pulmonary emboli without acute cor pulmonale: Secondary | ICD-10-CM | POA: Diagnosis not present

## 2021-07-26 DIAGNOSIS — I10 Essential (primary) hypertension: Secondary | ICD-10-CM | POA: Diagnosis not present

## 2021-07-26 DIAGNOSIS — I4891 Unspecified atrial fibrillation: Secondary | ICD-10-CM | POA: Diagnosis not present

## 2021-07-26 DIAGNOSIS — E876 Hypokalemia: Secondary | ICD-10-CM | POA: Diagnosis not present

## 2021-07-26 DIAGNOSIS — I130 Hypertensive heart and chronic kidney disease with heart failure and stage 1 through stage 4 chronic kidney disease, or unspecified chronic kidney disease: Secondary | ICD-10-CM | POA: Diagnosis not present

## 2021-07-26 DIAGNOSIS — I2510 Atherosclerotic heart disease of native coronary artery without angina pectoris: Secondary | ICD-10-CM | POA: Diagnosis not present

## 2021-07-26 DIAGNOSIS — M170 Bilateral primary osteoarthritis of knee: Secondary | ICD-10-CM | POA: Diagnosis not present

## 2021-07-26 DIAGNOSIS — M479 Spondylosis, unspecified: Secondary | ICD-10-CM | POA: Diagnosis not present

## 2021-07-26 DIAGNOSIS — E1143 Type 2 diabetes mellitus with diabetic autonomic (poly)neuropathy: Secondary | ICD-10-CM | POA: Diagnosis not present

## 2021-07-26 DIAGNOSIS — I5032 Chronic diastolic (congestive) heart failure: Secondary | ICD-10-CM | POA: Diagnosis not present

## 2021-07-26 DIAGNOSIS — J441 Chronic obstructive pulmonary disease with (acute) exacerbation: Secondary | ICD-10-CM | POA: Diagnosis not present

## 2021-07-26 DIAGNOSIS — E785 Hyperlipidemia, unspecified: Secondary | ICD-10-CM | POA: Diagnosis not present

## 2021-07-26 DIAGNOSIS — I088 Other rheumatic multiple valve diseases: Secondary | ICD-10-CM | POA: Diagnosis not present

## 2021-07-26 DIAGNOSIS — E1122 Type 2 diabetes mellitus with diabetic chronic kidney disease: Secondary | ICD-10-CM | POA: Diagnosis not present

## 2021-07-26 LAB — LUPUS ANTICOAGULANT PANEL
DRVVT: 43.3 s (ref 0.0–47.0)
PTT Lupus Anticoagulant: 37 s (ref 0.0–51.9)

## 2021-07-29 DIAGNOSIS — I2510 Atherosclerotic heart disease of native coronary artery without angina pectoris: Secondary | ICD-10-CM | POA: Diagnosis not present

## 2021-07-29 DIAGNOSIS — Z7722 Contact with and (suspected) exposure to environmental tobacco smoke (acute) (chronic): Secondary | ICD-10-CM | POA: Diagnosis not present

## 2021-07-29 DIAGNOSIS — I5032 Chronic diastolic (congestive) heart failure: Secondary | ICD-10-CM | POA: Diagnosis not present

## 2021-07-29 DIAGNOSIS — Z7901 Long term (current) use of anticoagulants: Secondary | ICD-10-CM | POA: Diagnosis not present

## 2021-07-29 DIAGNOSIS — E876 Hypokalemia: Secondary | ICD-10-CM | POA: Diagnosis not present

## 2021-07-29 DIAGNOSIS — M170 Bilateral primary osteoarthritis of knee: Secondary | ICD-10-CM | POA: Diagnosis not present

## 2021-07-29 DIAGNOSIS — I2694 Multiple subsegmental pulmonary emboli without acute cor pulmonale: Secondary | ICD-10-CM | POA: Diagnosis not present

## 2021-07-29 DIAGNOSIS — I088 Other rheumatic multiple valve diseases: Secondary | ICD-10-CM | POA: Diagnosis not present

## 2021-07-29 DIAGNOSIS — J441 Chronic obstructive pulmonary disease with (acute) exacerbation: Secondary | ICD-10-CM | POA: Diagnosis not present

## 2021-07-29 DIAGNOSIS — E1122 Type 2 diabetes mellitus with diabetic chronic kidney disease: Secondary | ICD-10-CM | POA: Diagnosis not present

## 2021-07-29 DIAGNOSIS — Z9181 History of falling: Secondary | ICD-10-CM | POA: Diagnosis not present

## 2021-07-29 DIAGNOSIS — N1832 Chronic kidney disease, stage 3b: Secondary | ICD-10-CM | POA: Diagnosis not present

## 2021-07-29 DIAGNOSIS — I2699 Other pulmonary embolism without acute cor pulmonale: Secondary | ICD-10-CM | POA: Diagnosis not present

## 2021-07-29 DIAGNOSIS — I700 Atherosclerosis of aorta: Secondary | ICD-10-CM | POA: Diagnosis not present

## 2021-07-29 DIAGNOSIS — J9601 Acute respiratory failure with hypoxia: Secondary | ICD-10-CM | POA: Diagnosis not present

## 2021-07-29 DIAGNOSIS — E785 Hyperlipidemia, unspecified: Secondary | ICD-10-CM | POA: Diagnosis not present

## 2021-07-29 DIAGNOSIS — I4891 Unspecified atrial fibrillation: Secondary | ICD-10-CM | POA: Diagnosis not present

## 2021-07-29 DIAGNOSIS — I313 Pericardial effusion (noninflammatory): Secondary | ICD-10-CM | POA: Diagnosis not present

## 2021-07-29 DIAGNOSIS — I130 Hypertensive heart and chronic kidney disease with heart failure and stage 1 through stage 4 chronic kidney disease, or unspecified chronic kidney disease: Secondary | ICD-10-CM | POA: Diagnosis not present

## 2021-07-29 DIAGNOSIS — M479 Spondylosis, unspecified: Secondary | ICD-10-CM | POA: Diagnosis not present

## 2021-07-29 LAB — PROTHROMBIN GENE MUTATION

## 2021-07-29 LAB — FACTOR 5 LEIDEN

## 2021-08-01 DIAGNOSIS — I2694 Multiple subsegmental pulmonary emboli without acute cor pulmonale: Secondary | ICD-10-CM | POA: Diagnosis not present

## 2021-08-01 DIAGNOSIS — M479 Spondylosis, unspecified: Secondary | ICD-10-CM | POA: Diagnosis not present

## 2021-08-01 DIAGNOSIS — E1122 Type 2 diabetes mellitus with diabetic chronic kidney disease: Secondary | ICD-10-CM | POA: Diagnosis not present

## 2021-08-01 DIAGNOSIS — I5032 Chronic diastolic (congestive) heart failure: Secondary | ICD-10-CM | POA: Diagnosis not present

## 2021-08-01 DIAGNOSIS — I130 Hypertensive heart and chronic kidney disease with heart failure and stage 1 through stage 4 chronic kidney disease, or unspecified chronic kidney disease: Secondary | ICD-10-CM | POA: Diagnosis not present

## 2021-08-01 DIAGNOSIS — E785 Hyperlipidemia, unspecified: Secondary | ICD-10-CM | POA: Diagnosis not present

## 2021-08-01 DIAGNOSIS — I4891 Unspecified atrial fibrillation: Secondary | ICD-10-CM | POA: Diagnosis not present

## 2021-08-01 DIAGNOSIS — I2510 Atherosclerotic heart disease of native coronary artery without angina pectoris: Secondary | ICD-10-CM | POA: Diagnosis not present

## 2021-08-01 DIAGNOSIS — J9601 Acute respiratory failure with hypoxia: Secondary | ICD-10-CM | POA: Diagnosis not present

## 2021-08-01 DIAGNOSIS — Z7722 Contact with and (suspected) exposure to environmental tobacco smoke (acute) (chronic): Secondary | ICD-10-CM | POA: Diagnosis not present

## 2021-08-01 DIAGNOSIS — E876 Hypokalemia: Secondary | ICD-10-CM | POA: Diagnosis not present

## 2021-08-01 DIAGNOSIS — J441 Chronic obstructive pulmonary disease with (acute) exacerbation: Secondary | ICD-10-CM | POA: Diagnosis not present

## 2021-08-01 DIAGNOSIS — I088 Other rheumatic multiple valve diseases: Secondary | ICD-10-CM | POA: Diagnosis not present

## 2021-08-01 DIAGNOSIS — N1832 Chronic kidney disease, stage 3b: Secondary | ICD-10-CM | POA: Diagnosis not present

## 2021-08-01 DIAGNOSIS — Z9181 History of falling: Secondary | ICD-10-CM | POA: Diagnosis not present

## 2021-08-01 DIAGNOSIS — I700 Atherosclerosis of aorta: Secondary | ICD-10-CM | POA: Diagnosis not present

## 2021-08-01 DIAGNOSIS — I2699 Other pulmonary embolism without acute cor pulmonale: Secondary | ICD-10-CM | POA: Diagnosis not present

## 2021-08-01 DIAGNOSIS — M170 Bilateral primary osteoarthritis of knee: Secondary | ICD-10-CM | POA: Diagnosis not present

## 2021-08-01 DIAGNOSIS — I313 Pericardial effusion (noninflammatory): Secondary | ICD-10-CM | POA: Diagnosis not present

## 2021-08-01 DIAGNOSIS — Z7901 Long term (current) use of anticoagulants: Secondary | ICD-10-CM | POA: Diagnosis not present

## 2021-08-03 DIAGNOSIS — J9601 Acute respiratory failure with hypoxia: Secondary | ICD-10-CM | POA: Diagnosis not present

## 2021-08-03 DIAGNOSIS — Z7722 Contact with and (suspected) exposure to environmental tobacco smoke (acute) (chronic): Secondary | ICD-10-CM | POA: Diagnosis not present

## 2021-08-03 DIAGNOSIS — I2694 Multiple subsegmental pulmonary emboli without acute cor pulmonale: Secondary | ICD-10-CM | POA: Diagnosis not present

## 2021-08-03 DIAGNOSIS — I4891 Unspecified atrial fibrillation: Secondary | ICD-10-CM | POA: Diagnosis not present

## 2021-08-03 DIAGNOSIS — I5032 Chronic diastolic (congestive) heart failure: Secondary | ICD-10-CM | POA: Diagnosis not present

## 2021-08-03 DIAGNOSIS — E1122 Type 2 diabetes mellitus with diabetic chronic kidney disease: Secondary | ICD-10-CM | POA: Diagnosis not present

## 2021-08-03 DIAGNOSIS — J441 Chronic obstructive pulmonary disease with (acute) exacerbation: Secondary | ICD-10-CM | POA: Diagnosis not present

## 2021-08-03 DIAGNOSIS — I2699 Other pulmonary embolism without acute cor pulmonale: Secondary | ICD-10-CM | POA: Diagnosis not present

## 2021-08-03 DIAGNOSIS — I130 Hypertensive heart and chronic kidney disease with heart failure and stage 1 through stage 4 chronic kidney disease, or unspecified chronic kidney disease: Secondary | ICD-10-CM | POA: Diagnosis not present

## 2021-08-03 DIAGNOSIS — M170 Bilateral primary osteoarthritis of knee: Secondary | ICD-10-CM | POA: Diagnosis not present

## 2021-08-03 DIAGNOSIS — I700 Atherosclerosis of aorta: Secondary | ICD-10-CM | POA: Diagnosis not present

## 2021-08-03 DIAGNOSIS — I313 Pericardial effusion (noninflammatory): Secondary | ICD-10-CM | POA: Diagnosis not present

## 2021-08-03 DIAGNOSIS — Z7901 Long term (current) use of anticoagulants: Secondary | ICD-10-CM | POA: Diagnosis not present

## 2021-08-03 DIAGNOSIS — Z9181 History of falling: Secondary | ICD-10-CM | POA: Diagnosis not present

## 2021-08-03 DIAGNOSIS — M479 Spondylosis, unspecified: Secondary | ICD-10-CM | POA: Diagnosis not present

## 2021-08-03 DIAGNOSIS — E785 Hyperlipidemia, unspecified: Secondary | ICD-10-CM | POA: Diagnosis not present

## 2021-08-03 DIAGNOSIS — I2510 Atherosclerotic heart disease of native coronary artery without angina pectoris: Secondary | ICD-10-CM | POA: Diagnosis not present

## 2021-08-03 DIAGNOSIS — I088 Other rheumatic multiple valve diseases: Secondary | ICD-10-CM | POA: Diagnosis not present

## 2021-08-03 DIAGNOSIS — N1832 Chronic kidney disease, stage 3b: Secondary | ICD-10-CM | POA: Diagnosis not present

## 2021-08-03 DIAGNOSIS — E876 Hypokalemia: Secondary | ICD-10-CM | POA: Diagnosis not present

## 2021-08-08 DIAGNOSIS — I700 Atherosclerosis of aorta: Secondary | ICD-10-CM | POA: Diagnosis not present

## 2021-08-08 DIAGNOSIS — I2694 Multiple subsegmental pulmonary emboli without acute cor pulmonale: Secondary | ICD-10-CM | POA: Diagnosis not present

## 2021-08-08 DIAGNOSIS — I2510 Atherosclerotic heart disease of native coronary artery without angina pectoris: Secondary | ICD-10-CM | POA: Diagnosis not present

## 2021-08-08 DIAGNOSIS — M479 Spondylosis, unspecified: Secondary | ICD-10-CM | POA: Diagnosis not present

## 2021-08-08 DIAGNOSIS — E876 Hypokalemia: Secondary | ICD-10-CM | POA: Diagnosis not present

## 2021-08-08 DIAGNOSIS — I130 Hypertensive heart and chronic kidney disease with heart failure and stage 1 through stage 4 chronic kidney disease, or unspecified chronic kidney disease: Secondary | ICD-10-CM | POA: Diagnosis not present

## 2021-08-08 DIAGNOSIS — I4891 Unspecified atrial fibrillation: Secondary | ICD-10-CM | POA: Diagnosis not present

## 2021-08-08 DIAGNOSIS — I088 Other rheumatic multiple valve diseases: Secondary | ICD-10-CM | POA: Diagnosis not present

## 2021-08-08 DIAGNOSIS — E785 Hyperlipidemia, unspecified: Secondary | ICD-10-CM | POA: Diagnosis not present

## 2021-08-08 DIAGNOSIS — J441 Chronic obstructive pulmonary disease with (acute) exacerbation: Secondary | ICD-10-CM | POA: Diagnosis not present

## 2021-08-08 DIAGNOSIS — J9601 Acute respiratory failure with hypoxia: Secondary | ICD-10-CM | POA: Diagnosis not present

## 2021-08-08 DIAGNOSIS — Z7722 Contact with and (suspected) exposure to environmental tobacco smoke (acute) (chronic): Secondary | ICD-10-CM | POA: Diagnosis not present

## 2021-08-08 DIAGNOSIS — Z7901 Long term (current) use of anticoagulants: Secondary | ICD-10-CM | POA: Diagnosis not present

## 2021-08-08 DIAGNOSIS — M170 Bilateral primary osteoarthritis of knee: Secondary | ICD-10-CM | POA: Diagnosis not present

## 2021-08-08 DIAGNOSIS — N1832 Chronic kidney disease, stage 3b: Secondary | ICD-10-CM | POA: Diagnosis not present

## 2021-08-08 DIAGNOSIS — Z9181 History of falling: Secondary | ICD-10-CM | POA: Diagnosis not present

## 2021-08-08 DIAGNOSIS — I313 Pericardial effusion (noninflammatory): Secondary | ICD-10-CM | POA: Diagnosis not present

## 2021-08-08 DIAGNOSIS — I5032 Chronic diastolic (congestive) heart failure: Secondary | ICD-10-CM | POA: Diagnosis not present

## 2021-08-08 DIAGNOSIS — I2699 Other pulmonary embolism without acute cor pulmonale: Secondary | ICD-10-CM | POA: Diagnosis not present

## 2021-08-08 DIAGNOSIS — E1122 Type 2 diabetes mellitus with diabetic chronic kidney disease: Secondary | ICD-10-CM | POA: Diagnosis not present

## 2021-08-10 DIAGNOSIS — J9601 Acute respiratory failure with hypoxia: Secondary | ICD-10-CM | POA: Diagnosis not present

## 2021-08-10 DIAGNOSIS — N1832 Chronic kidney disease, stage 3b: Secondary | ICD-10-CM | POA: Diagnosis not present

## 2021-08-10 DIAGNOSIS — I130 Hypertensive heart and chronic kidney disease with heart failure and stage 1 through stage 4 chronic kidney disease, or unspecified chronic kidney disease: Secondary | ICD-10-CM | POA: Diagnosis not present

## 2021-08-10 DIAGNOSIS — I5032 Chronic diastolic (congestive) heart failure: Secondary | ICD-10-CM | POA: Diagnosis not present

## 2021-08-10 DIAGNOSIS — I700 Atherosclerosis of aorta: Secondary | ICD-10-CM | POA: Diagnosis not present

## 2021-08-10 DIAGNOSIS — E1122 Type 2 diabetes mellitus with diabetic chronic kidney disease: Secondary | ICD-10-CM | POA: Diagnosis not present

## 2021-08-10 DIAGNOSIS — E876 Hypokalemia: Secondary | ICD-10-CM | POA: Diagnosis not present

## 2021-08-10 DIAGNOSIS — M170 Bilateral primary osteoarthritis of knee: Secondary | ICD-10-CM | POA: Diagnosis not present

## 2021-08-10 DIAGNOSIS — Z7901 Long term (current) use of anticoagulants: Secondary | ICD-10-CM | POA: Diagnosis not present

## 2021-08-10 DIAGNOSIS — I313 Pericardial effusion (noninflammatory): Secondary | ICD-10-CM | POA: Diagnosis not present

## 2021-08-10 DIAGNOSIS — I2510 Atherosclerotic heart disease of native coronary artery without angina pectoris: Secondary | ICD-10-CM | POA: Diagnosis not present

## 2021-08-10 DIAGNOSIS — I2694 Multiple subsegmental pulmonary emboli without acute cor pulmonale: Secondary | ICD-10-CM | POA: Diagnosis not present

## 2021-08-10 DIAGNOSIS — I4891 Unspecified atrial fibrillation: Secondary | ICD-10-CM | POA: Diagnosis not present

## 2021-08-10 DIAGNOSIS — J441 Chronic obstructive pulmonary disease with (acute) exacerbation: Secondary | ICD-10-CM | POA: Diagnosis not present

## 2021-08-10 DIAGNOSIS — Z7722 Contact with and (suspected) exposure to environmental tobacco smoke (acute) (chronic): Secondary | ICD-10-CM | POA: Diagnosis not present

## 2021-08-10 DIAGNOSIS — I088 Other rheumatic multiple valve diseases: Secondary | ICD-10-CM | POA: Diagnosis not present

## 2021-08-10 DIAGNOSIS — M479 Spondylosis, unspecified: Secondary | ICD-10-CM | POA: Diagnosis not present

## 2021-08-10 DIAGNOSIS — I2699 Other pulmonary embolism without acute cor pulmonale: Secondary | ICD-10-CM | POA: Diagnosis not present

## 2021-08-10 DIAGNOSIS — E785 Hyperlipidemia, unspecified: Secondary | ICD-10-CM | POA: Diagnosis not present

## 2021-08-10 DIAGNOSIS — Z9181 History of falling: Secondary | ICD-10-CM | POA: Diagnosis not present

## 2021-08-17 DIAGNOSIS — I5032 Chronic diastolic (congestive) heart failure: Secondary | ICD-10-CM | POA: Diagnosis not present

## 2021-08-17 DIAGNOSIS — N1832 Chronic kidney disease, stage 3b: Secondary | ICD-10-CM | POA: Diagnosis not present

## 2021-08-17 DIAGNOSIS — I130 Hypertensive heart and chronic kidney disease with heart failure and stage 1 through stage 4 chronic kidney disease, or unspecified chronic kidney disease: Secondary | ICD-10-CM | POA: Diagnosis not present

## 2021-08-17 DIAGNOSIS — I2694 Multiple subsegmental pulmonary emboli without acute cor pulmonale: Secondary | ICD-10-CM | POA: Diagnosis not present

## 2021-08-17 DIAGNOSIS — Z9181 History of falling: Secondary | ICD-10-CM | POA: Diagnosis not present

## 2021-08-17 DIAGNOSIS — I313 Pericardial effusion (noninflammatory): Secondary | ICD-10-CM | POA: Diagnosis not present

## 2021-08-17 DIAGNOSIS — J9601 Acute respiratory failure with hypoxia: Secondary | ICD-10-CM | POA: Diagnosis not present

## 2021-08-17 DIAGNOSIS — M479 Spondylosis, unspecified: Secondary | ICD-10-CM | POA: Diagnosis not present

## 2021-08-17 DIAGNOSIS — I700 Atherosclerosis of aorta: Secondary | ICD-10-CM | POA: Diagnosis not present

## 2021-08-17 DIAGNOSIS — I4891 Unspecified atrial fibrillation: Secondary | ICD-10-CM | POA: Diagnosis not present

## 2021-08-17 DIAGNOSIS — E1122 Type 2 diabetes mellitus with diabetic chronic kidney disease: Secondary | ICD-10-CM | POA: Diagnosis not present

## 2021-08-17 DIAGNOSIS — E876 Hypokalemia: Secondary | ICD-10-CM | POA: Diagnosis not present

## 2021-08-17 DIAGNOSIS — Z7901 Long term (current) use of anticoagulants: Secondary | ICD-10-CM | POA: Diagnosis not present

## 2021-08-17 DIAGNOSIS — M170 Bilateral primary osteoarthritis of knee: Secondary | ICD-10-CM | POA: Diagnosis not present

## 2021-08-17 DIAGNOSIS — J441 Chronic obstructive pulmonary disease with (acute) exacerbation: Secondary | ICD-10-CM | POA: Diagnosis not present

## 2021-08-17 DIAGNOSIS — E785 Hyperlipidemia, unspecified: Secondary | ICD-10-CM | POA: Diagnosis not present

## 2021-08-17 DIAGNOSIS — I2699 Other pulmonary embolism without acute cor pulmonale: Secondary | ICD-10-CM | POA: Diagnosis not present

## 2021-08-17 DIAGNOSIS — I088 Other rheumatic multiple valve diseases: Secondary | ICD-10-CM | POA: Diagnosis not present

## 2021-08-17 DIAGNOSIS — I2510 Atherosclerotic heart disease of native coronary artery without angina pectoris: Secondary | ICD-10-CM | POA: Diagnosis not present

## 2021-08-17 DIAGNOSIS — Z7722 Contact with and (suspected) exposure to environmental tobacco smoke (acute) (chronic): Secondary | ICD-10-CM | POA: Diagnosis not present

## 2021-08-24 DIAGNOSIS — I129 Hypertensive chronic kidney disease with stage 1 through stage 4 chronic kidney disease, or unspecified chronic kidney disease: Secondary | ICD-10-CM | POA: Diagnosis not present

## 2021-08-24 DIAGNOSIS — N182 Chronic kidney disease, stage 2 (mild): Secondary | ICD-10-CM | POA: Diagnosis not present

## 2021-08-24 DIAGNOSIS — E7849 Other hyperlipidemia: Secondary | ICD-10-CM | POA: Diagnosis not present

## 2021-10-27 DIAGNOSIS — I5032 Chronic diastolic (congestive) heart failure: Secondary | ICD-10-CM | POA: Diagnosis not present

## 2021-10-27 DIAGNOSIS — E1122 Type 2 diabetes mellitus with diabetic chronic kidney disease: Secondary | ICD-10-CM | POA: Diagnosis not present

## 2021-10-27 DIAGNOSIS — I10 Essential (primary) hypertension: Secondary | ICD-10-CM | POA: Diagnosis not present

## 2021-10-27 DIAGNOSIS — E785 Hyperlipidemia, unspecified: Secondary | ICD-10-CM | POA: Diagnosis not present

## 2021-10-27 DIAGNOSIS — Z86711 Personal history of pulmonary embolism: Secondary | ICD-10-CM | POA: Diagnosis not present

## 2021-12-23 ENCOUNTER — Other Ambulatory Visit: Payer: Self-pay

## 2021-12-23 ENCOUNTER — Ambulatory Visit
Admission: EM | Admit: 2021-12-23 | Discharge: 2021-12-23 | Disposition: A | Discharge status: Home / Self Care | Payer: Medicare Other

## 2021-12-23 DIAGNOSIS — J069 Acute upper respiratory infection, unspecified: Secondary | ICD-10-CM

## 2021-12-23 DIAGNOSIS — Z20828 Contact with and (suspected) exposure to other viral communicable diseases: Secondary | ICD-10-CM | POA: Diagnosis not present

## 2021-12-23 MED ORDER — BENZONATATE 200 MG PO CAPS: 200.0000 mg | ORAL_CAPSULE | Freq: Three times a day (TID) | ORAL | 0 refills | Status: AC | PRN

## 2021-12-23 NOTE — ED Triage Notes (Signed)
Patient states she has a cough and a head cold since yesterday.   She states she took some OTC cough meds and tylenol   Patient states she has began to lose her voice.  Denies Fever

## 2021-12-24 LAB — COVID-19, FLU A+B NAA
Influenza A, NAA: NOT DETECTED
Influenza B, NAA: NOT DETECTED
SARS-CoV-2, NAA: NOT DETECTED

## 2021-12-25 NOTE — ED Provider Notes (Signed)
[facility]    CSN: [id number] Arrival date & time: 12/23/21  1551      History   Chief Complaint Chief Complaint  Patient presents with   Cough    Congestion and cough    HPI Rachel Copeland is a 88 y.o. female.   Presenting today with 1 day history of cough, congestion, scratchy throat.  States she is now starting to lose her voice.  She denies fever, chills, chest pain, shortness of breath, abdominal pain, nausea vomiting or diarrhea.  Has been taking over-the-counter cough medication and Tylenol with minimal temporary relief.  Multiple sick contacts recently.  She has a history of pneumonia, pulmonary embolism on Eliquis, diabetes, hypertension, heart failure.   Past Medical History:  Diagnosis Date   CHF (congestive heart failure) (HCC)    Diabetes mellitus    no medications   Hyperlipidemia    Hypertension     Patient Active Problem List   Diagnosis Date Noted   Acute pulmonary embolism with acute cor pulmonale ([place]) 07/21/2021   Pulmonary embolism ([place]) 07/20/2021   Localized swelling of left lower extremity 11/28/2018   Hypokalemia 11/28/2018   Bilateral pneumonia    CAP (community acquired pneumonia) 12/02/2014   HTN (hypertension) 12/02/2014   Dyslipidemia 12/02/2014   Chronic diastolic CHF (congestive heart failure) ([place]) 12/02/2014   Anemia of chronic disease 12/02/2014   Thrombocytopenia ([place]) 12/02/2014   Chronic kidney disease (CKD), stage IV (severe) ([place]) 12/02/2014   Diabetes mellitus type 2, controlled ([place]) 12/01/2014   Acute respiratory failure with hypoxia ([place]) 12/01/2014   THYROID NODULE 08/06/2009   CATARACTS, BILATERAL 11/20/2006   CAROTID STENOSIS 11/20/2006    Past Surgical History:  Procedure Laterality Date   NO PAST SURGERIES      OB History   No obstetric history on file.      Home Medications    Prior to Admission medications   Medication Sig Start Date End Date Taking? Authorizing Provider   benzonatate (TESSALON) 200 MG capsule Take 1 capsule (200 mg total) by mouth 3 (three) times daily as needed for cough. 12/23/21  Yes Volney American, PA-C  amLODipine (NORVASC) 10 MG tablet Take 10 mg by mouth daily.    [provider]  apixaban (ELIQUIS) 5 MG TABS tablet Take 2 tablets (10 mg total) by mouth 2 (two) times daily. For 7 days. Then 1 tablet two times daily starting 07/30/21 07/23/21   Orson Eva, MD  Ascorbic Acid (VITAMIN C) 1000 MG tablet Take 1,000 mg by mouth every morning.    [provider]  calcium-vitamin D (OSCAL WITH D) 500-200 MG-UNIT per tablet Take 1 tablet by mouth every morning.    [provider]  carvedilol (COREG) 25 MG tablet Take 25 mg by mouth 2 (two) times daily with a meal.    [provider]  fish oil-omega-3 fatty acids 1000 MG capsule Take 1 g by mouth every morning.    [provider]  furosemide (LASIX) 20 MG tablet Take 20 mg by mouth 2 (two) times a week. 12/17/21   [provider]  Multiple Vitamins-Minerals (MULTIVITAMIN ADULT PO) Take 1 tablet by mouth daily. One a day womens    [provider]  rosuvastatin (CRESTOR) 10 MG tablet Take 10 mg by mouth daily. 03/21/19   [provider]    Family History Family History  Problem Relation Age of Onset   Colon cancer Mother    Colon polyps Mother  Diabetes Mellitus II Brother    Diabetes Mellitus II Daughter    Breast cancer Neg Hx    Stomach cancer Neg Hx     Social History Social History   Tobacco Use   Smoking status: Never   Smokeless tobacco: Never  Vaping Use   Vaping Use: Never used  Substance Use Topics   Alcohol use: No   Drug use: No     Allergies   Sitagliptin phosphate   Review of Systems Review of Systems Per HPI  Physical Exam Triage Vital Signs ED Triage Vitals  Enc Vitals Group     BP 12/23/21 1700 (!) 202/80     Pulse Rate 12/23/21 1700 82     Resp 12/23/21 1700 16     Temp  12/23/21 1700 98.7 F (37.1 C)     Temp src --      SpO2 12/23/21 1700 94 %     Weight --      Height --      Head Circumference --      Peak Flow --      Pain Score 12/23/21 1702 0     Pain Loc --      Pain Edu? --      Excl. in [place]? --    No data found.  Updated Vital Signs BP (!) 202/80 (BP Location: Right Arm)    Pulse 82    Temp 98.7 F (37.1 C)    Resp 16    SpO2 94%   Visual Acuity Right Eye Distance:   Left Eye Distance:   Bilateral Distance:    Right Eye Near:   Left Eye Near:    Bilateral Near:     Physical Exam Vitals and nursing note reviewed.  Constitutional:      Appearance: Normal appearance.  HENT:     Head: Atraumatic.     Right Ear: Tympanic membrane and external ear normal.     Left Ear: Tympanic membrane and external ear normal.     Nose: Rhinorrhea present.     Mouth/Throat:     Mouth: Mucous membranes are moist.     Pharynx: Posterior oropharyngeal erythema present.  Eyes:     Extraocular Movements: Extraocular movements intact.     Conjunctiva/sclera: Conjunctivae normal.  Cardiovascular:     Rate and Rhythm: Normal rate and regular rhythm.     Heart sounds: Normal heart sounds.  Pulmonary:     Effort: Pulmonary effort is normal.     Breath sounds: Normal breath sounds. No wheezing or rales.  Musculoskeletal:        General: Normal range of motion.     Cervical back: Normal range of motion and neck supple.  Skin:    General: Skin is warm and dry.  Neurological:     Mental Status: She is alert and oriented to person, place, and time.  Psychiatric:        Mood and Affect: Mood normal.        Thought Content: Thought content normal.     UC Treatments / Results  Labs (all labs ordered are listed, but only abnormal results are displayed) Labs Reviewed  COVID-19, FLU A+B NAA   Narrative:    Performed at:  [address], [place], [place]  [id number] Lab Director: Rush Farmer MD, Phone:  [phone]     EKG   Radiology No results found.  Procedures Procedures (including critical care time)  Medications Ordered in  UC Medications - No data to display  Initial Impression / Assessment and Plan / UC Course  I have reviewed the triage vital signs and the nursing notes.  Pertinent labs & imaging results that were available during my care of the patient were reviewed by me and considered in my medical decision making (see chart for details).     Vital signs overall reassuring today, COVID and flu testing pending.  We will treat with Ladona Ridgel, continued over-the-counter cold and congestion medications, supportive home care.  We will adjust as needed based on COVID and flu results.  Discussed strict return precautions for any worsening symptoms and recheck with PCP next few days.  Final Clinical Impressions(s) / UC Diagnoses   Final diagnoses:  Exposure to the flu  Viral URI with cough   Discharge Instructions   None    ED Prescriptions     Medication Sig Dispense Auth. Provider   benzonatate (TESSALON) 200 MG capsule Take 1 capsule (200 mg total) by mouth 3 (three) times daily as needed for cough. 20 capsule Volney American, [place]      PDMP not reviewed this encounter.   Merrie Roof [place], [place] 12/25/21 [phone]

## 2021-12-27 DIAGNOSIS — J208 Acute bronchitis due to other specified organisms: Secondary | ICD-10-CM | POA: Diagnosis not present

## 2022-02-01 DIAGNOSIS — Z86711 Personal history of pulmonary embolism: Secondary | ICD-10-CM | POA: Diagnosis not present

## 2022-02-01 DIAGNOSIS — I10 Essential (primary) hypertension: Secondary | ICD-10-CM | POA: Diagnosis not present

## 2022-02-01 DIAGNOSIS — E1122 Type 2 diabetes mellitus with diabetic chronic kidney disease: Secondary | ICD-10-CM | POA: Diagnosis not present

## 2022-02-01 DIAGNOSIS — E785 Hyperlipidemia, unspecified: Secondary | ICD-10-CM | POA: Diagnosis not present

## 2022-02-01 DIAGNOSIS — Z0000 Encounter for general adult medical examination without abnormal findings: Secondary | ICD-10-CM | POA: Diagnosis not present

## 2022-02-01 DIAGNOSIS — R0602 Shortness of breath: Secondary | ICD-10-CM | POA: Diagnosis not present

## 2022-05-03 DIAGNOSIS — E1122 Type 2 diabetes mellitus with diabetic chronic kidney disease: Secondary | ICD-10-CM | POA: Diagnosis not present

## 2022-05-03 DIAGNOSIS — E785 Hyperlipidemia, unspecified: Secondary | ICD-10-CM | POA: Diagnosis not present

## 2022-05-03 DIAGNOSIS — Z0000 Encounter for general adult medical examination without abnormal findings: Secondary | ICD-10-CM | POA: Diagnosis not present

## 2022-05-03 DIAGNOSIS — I5032 Chronic diastolic (congestive) heart failure: Secondary | ICD-10-CM | POA: Diagnosis not present

## 2022-05-03 DIAGNOSIS — I10 Essential (primary) hypertension: Secondary | ICD-10-CM | POA: Diagnosis not present

## 2022-05-23 ENCOUNTER — Other Ambulatory Visit: Payer: Self-pay | Admitting: Internal Medicine

## 2022-05-23 DIAGNOSIS — Z1231 Encounter for screening mammogram for malignant neoplasm of breast: Secondary | ICD-10-CM

## 2022-06-08 ENCOUNTER — Ambulatory Visit: Payer: Medicare Other

## 2022-06-08 ENCOUNTER — Other Ambulatory Visit: Payer: Self-pay | Admitting: Internal Medicine

## 2022-06-08 DIAGNOSIS — Z1231 Encounter for screening mammogram for malignant neoplasm of breast: Secondary | ICD-10-CM

## 2022-06-14 ENCOUNTER — Ambulatory Visit
Admission: RE | Admit: 2022-06-14 | Discharge: 2022-06-14 | Disposition: A | Discharge status: Home / Self Care | Payer: Medicare Other | Source: Ambulatory Visit | Attending: Internal Medicine | Admitting: Internal Medicine

## 2022-06-14 DIAGNOSIS — Z1231 Encounter for screening mammogram for malignant neoplasm of breast: Secondary | ICD-10-CM | POA: Diagnosis not present

## 2022-07-31 DIAGNOSIS — M8589 Other specified disorders of bone density and structure, multiple sites: Secondary | ICD-10-CM | POA: Diagnosis not present

## 2022-07-31 DIAGNOSIS — M810 Age-related osteoporosis without current pathological fracture: Secondary | ICD-10-CM | POA: Diagnosis not present

## 2022-08-10 DIAGNOSIS — E785 Hyperlipidemia, unspecified: Secondary | ICD-10-CM | POA: Diagnosis not present

## 2022-08-10 DIAGNOSIS — Z0000 Encounter for general adult medical examination without abnormal findings: Secondary | ICD-10-CM | POA: Diagnosis not present

## 2022-08-10 DIAGNOSIS — I5032 Chronic diastolic (congestive) heart failure: Secondary | ICD-10-CM | POA: Diagnosis not present

## 2022-08-10 DIAGNOSIS — I10 Essential (primary) hypertension: Secondary | ICD-10-CM | POA: Diagnosis not present

## 2022-08-10 DIAGNOSIS — E1122 Type 2 diabetes mellitus with diabetic chronic kidney disease: Secondary | ICD-10-CM | POA: Diagnosis not present

## 2022-12-13 DIAGNOSIS — I10 Essential (primary) hypertension: Secondary | ICD-10-CM | POA: Diagnosis not present

## 2022-12-13 DIAGNOSIS — E785 Hyperlipidemia, unspecified: Secondary | ICD-10-CM | POA: Diagnosis not present

## 2022-12-13 DIAGNOSIS — I5032 Chronic diastolic (congestive) heart failure: Secondary | ICD-10-CM | POA: Diagnosis not present

## 2022-12-13 DIAGNOSIS — J40 Bronchitis, not specified as acute or chronic: Secondary | ICD-10-CM | POA: Diagnosis not present

## 2022-12-13 DIAGNOSIS — E1122 Type 2 diabetes mellitus with diabetic chronic kidney disease: Secondary | ICD-10-CM | POA: Diagnosis not present

## 2023-03-15 DIAGNOSIS — E1122 Type 2 diabetes mellitus with diabetic chronic kidney disease: Secondary | ICD-10-CM | POA: Diagnosis not present

## 2023-03-15 DIAGNOSIS — N1831 Chronic kidney disease, stage 3a: Secondary | ICD-10-CM | POA: Diagnosis not present

## 2023-03-15 DIAGNOSIS — I10 Essential (primary) hypertension: Secondary | ICD-10-CM | POA: Diagnosis not present

## 2023-03-15 DIAGNOSIS — E785 Hyperlipidemia, unspecified: Secondary | ICD-10-CM | POA: Diagnosis not present

## 2023-03-15 DIAGNOSIS — I5031 Acute diastolic (congestive) heart failure: Secondary | ICD-10-CM | POA: Diagnosis not present

## 2023-03-15 DIAGNOSIS — J40 Bronchitis, not specified as acute or chronic: Secondary | ICD-10-CM | POA: Diagnosis not present

## 2023-07-16 DIAGNOSIS — I5032 Chronic diastolic (congestive) heart failure: Secondary | ICD-10-CM | POA: Diagnosis not present

## 2023-07-16 DIAGNOSIS — E785 Hyperlipidemia, unspecified: Secondary | ICD-10-CM | POA: Diagnosis not present

## 2023-07-16 DIAGNOSIS — E1122 Type 2 diabetes mellitus with diabetic chronic kidney disease: Secondary | ICD-10-CM | POA: Diagnosis not present

## 2023-07-16 DIAGNOSIS — I10 Essential (primary) hypertension: Secondary | ICD-10-CM | POA: Diagnosis not present

## 2023-07-16 DIAGNOSIS — N1831 Chronic kidney disease, stage 3a: Secondary | ICD-10-CM | POA: Diagnosis not present

## 2023-07-16 DIAGNOSIS — Z0000 Encounter for general adult medical examination without abnormal findings: Secondary | ICD-10-CM | POA: Diagnosis not present

## 2023-11-05 ENCOUNTER — Encounter (HOSPITAL_COMMUNITY): Payer: Self-pay | Admitting: Emergency Medicine

## 2023-11-05 ENCOUNTER — Emergency Department (HOSPITAL_COMMUNITY)
Admission: EM | Admit: 2023-11-05 | Discharge: 2023-11-05 | Disposition: A | Discharge status: Home / Self Care | Payer: 59 | Attending: Emergency Medicine | Admitting: Emergency Medicine

## 2023-11-05 ENCOUNTER — Other Ambulatory Visit: Payer: Self-pay

## 2023-11-05 DIAGNOSIS — E1122 Type 2 diabetes mellitus with diabetic chronic kidney disease: Secondary | ICD-10-CM | POA: Insufficient documentation

## 2023-11-05 DIAGNOSIS — R040 Epistaxis: Secondary | ICD-10-CM | POA: Diagnosis not present

## 2023-11-05 DIAGNOSIS — E785 Hyperlipidemia, unspecified: Secondary | ICD-10-CM | POA: Diagnosis not present

## 2023-11-05 DIAGNOSIS — I5032 Chronic diastolic (congestive) heart failure: Secondary | ICD-10-CM | POA: Diagnosis not present

## 2023-11-05 DIAGNOSIS — D649 Anemia, unspecified: Secondary | ICD-10-CM | POA: Diagnosis not present

## 2023-11-05 DIAGNOSIS — Z79899 Other long term (current) drug therapy: Secondary | ICD-10-CM | POA: Insufficient documentation

## 2023-11-05 DIAGNOSIS — I509 Heart failure, unspecified: Secondary | ICD-10-CM | POA: Diagnosis not present

## 2023-11-05 DIAGNOSIS — N189 Chronic kidney disease, unspecified: Secondary | ICD-10-CM | POA: Diagnosis not present

## 2023-11-05 DIAGNOSIS — I130 Hypertensive heart and chronic kidney disease with heart failure and stage 1 through stage 4 chronic kidney disease, or unspecified chronic kidney disease: Secondary | ICD-10-CM | POA: Diagnosis not present

## 2023-11-05 DIAGNOSIS — Z7901 Long term (current) use of anticoagulants: Secondary | ICD-10-CM | POA: Diagnosis not present

## 2023-11-05 DIAGNOSIS — Z0000 Encounter for general adult medical examination without abnormal findings: Secondary | ICD-10-CM | POA: Diagnosis not present

## 2023-11-05 DIAGNOSIS — N1831 Chronic kidney disease, stage 3a: Secondary | ICD-10-CM | POA: Diagnosis not present

## 2023-11-05 DIAGNOSIS — I10 Essential (primary) hypertension: Secondary | ICD-10-CM | POA: Diagnosis not present

## 2023-11-05 LAB — CBC WITH DIFFERENTIAL/PLATELET
Abs Immature Granulocytes: 0.02 10*3/uL (ref 0.00–0.07)
Basophils Absolute: 0 10*3/uL (ref 0.0–0.1)
Basophils Relative: 1 %
Eosinophils Absolute: 0.3 10*3/uL (ref 0.0–0.5)
Eosinophils Relative: 7 %
HCT: 34 % — ABNORMAL LOW (ref 36.0–46.0)
Hemoglobin: 11.4 g/dL — ABNORMAL LOW (ref 12.0–15.0)
Immature Granulocytes: 1 %
Lymphocytes Relative: 30 %
Lymphs Abs: 1.3 10*3/uL (ref 0.7–4.0)
MCH: 28.1 pg (ref 26.0–34.0)
MCHC: 33.5 g/dL (ref 30.0–36.0)
MCV: 84 fL (ref 80.0–100.0)
Monocytes Absolute: 0.5 10*3/uL (ref 0.1–1.0)
Monocytes Relative: 11 %
Neutro Abs: 2.2 10*3/uL (ref 1.7–7.7)
Neutrophils Relative %: 50 %
Platelets: 118 10*3/uL — ABNORMAL LOW (ref 150–400)
RBC: 4.05 MIL/uL (ref 3.87–5.11)
RDW: 15.8 % — ABNORMAL HIGH (ref 11.5–15.5)
WBC: 4.3 10*3/uL (ref 4.0–10.5)
nRBC: 0 % (ref 0.0–0.2)

## 2023-11-05 LAB — BASIC METABOLIC PANEL
Anion gap: 9 (ref 5–15)
BUN: 40 mg/dL — ABNORMAL HIGH (ref 8–23)
CO2: 21 mmol/L — ABNORMAL LOW (ref 22–32)
Calcium: 9.1 mg/dL (ref 8.9–10.3)
Chloride: 109 mmol/L (ref 98–111)
Creatinine, Ser: 1.46 mg/dL — ABNORMAL HIGH (ref 0.44–1.00)
GFR, Estimated: 34 mL/min — ABNORMAL LOW (ref >60)
Glucose, Bld: 133 mg/dL — ABNORMAL HIGH (ref 70–99)
Potassium: 4 mmol/L (ref 3.5–5.1)
Sodium: 139 mmol/L (ref 135–145)

## 2023-11-05 LAB — PROTIME-INR
INR: 1.3 — ABNORMAL HIGH (ref 0.8–1.2)
Prothrombin Time: 16.5 s — ABNORMAL HIGH (ref 11.4–15.2)

## 2023-11-05 MED ORDER — CARVEDILOL 12.5 MG PO TABS
25.0000 mg | ORAL_TABLET | Freq: Once | ORAL | Status: AC
  Administered 2023-11-05: 25 mg via ORAL
  Filled 2023-11-05: qty 2

## 2023-11-05 MED ORDER — OXYMETAZOLINE HCL 0.05 % NA SOLN: 1.0000 | Freq: Once | NASAL | Status: DC

## 2023-11-05 MED ORDER — AMLODIPINE BESYLATE 5 MG PO TABS
10.0000 mg | ORAL_TABLET | Freq: Once | ORAL | Status: AC
  Administered 2023-11-05: 10 mg via ORAL
  Filled 2023-11-05: qty 2

## 2023-11-05 MED ORDER — OXYMETAZOLINE HCL 0.05 % NA SOLN
1.0000 | Freq: Once | NASAL | Status: AC
  Administered 2023-11-05: 1 via NASAL
  Filled 2023-11-05: qty 30

## 2023-11-05 NOTE — ED Notes (Signed)
ED Provider at bedside. 

## 2023-11-05 NOTE — ED Triage Notes (Signed)
Pt here with c/o nosebleed since 7pm. Dr Durwin Nora at bedside.

## 2023-11-05 NOTE — ED Notes (Signed)
Pt still presenting with active epistaxis at this time. EDP aware

## 2023-11-05 NOTE — Discharge Instructions (Addendum)
If you have any further nosebleeding, utilize nostril packing and hold pressure.  If bleeding does not stop, return to the emergency department.  Your lab work showed decreased kidney function compared to 2 years ago.  Follow-up with your primary care doctor for ongoing surveillance of your kidney function.

## 2023-11-05 NOTE — ED Provider Notes (Signed)
[place] EMERGENCY DEPARTMENT AT [facility] Provider Note   CSN: [id number] Arrival date & time: 11/05/23  0035     History  Chief Complaint  Patient presents with   Epistaxis    Rachel Copeland is a 89 y.o. female.  HPI Patient presents for epistaxis.  Medical history includes HTN, HLD, CHF, CKD, DM, prior PE.  She has been prescribed Eliquis in the past but does not think she is taking it anymore.  She denies any recent trauma.  She had episode of spontaneous epistaxis yesterday which resolved.  It occurred again this evening.  For this reason, she presents to the ED.    Home Medications Prior to Admission medications   Medication Sig Start Date End Date Taking? Authorizing Provider  amLODipine (NORVASC) 10 MG tablet Take 10 mg by mouth daily.    [provider]  apixaban (ELIQUIS) 5 MG TABS tablet Take 2 tablets (10 mg total) by mouth 2 (two) times daily. For 7 days. Then 1 tablet two times daily starting 07/30/21 07/23/21   Catarina Hartshorn, MD  Ascorbic Acid (VITAMIN C) 1000 MG tablet Take 1,000 mg by mouth every morning.    [provider]  benzonatate (TESSALON) 200 MG capsule Take 1 capsule (200 mg total) by mouth 3 (three) times daily as needed for cough. 12/23/21   Particia Nearing, PA-C  calcium-vitamin D (OSCAL WITH D) 500-200 MG-UNIT per tablet Take 1 tablet by mouth every morning.    [provider]  carvedilol (COREG) 25 MG tablet Take 25 mg by mouth 2 (two) times daily with a meal.    [provider]  fish oil-omega-3 fatty acids 1000 MG capsule Take 1 g by mouth every morning.    [provider]  furosemide (LASIX) 20 MG tablet Take 20 mg by mouth 2 (two) times a week. 12/17/21   [provider]  Multiple Vitamins-Minerals (MULTIVITAMIN ADULT PO) Take 1 tablet by mouth daily. One a day womens    [provider]  rosuvastatin (CRESTOR) 10 MG tablet Take 10 mg by mouth daily. 03/21/19    [provider]      Allergies    Sitagliptin phosphate    Review of Systems   Review of Systems  HENT:  Positive for nosebleeds.   All other systems reviewed and are negative.   Physical Exam Updated Vital Signs BP (!) 182/71   Pulse 70   Temp (!) 97.4 F (36.3 C) (Oral)   Resp 18   Ht 5\' 5"  (1.651 m)   Wt 81.6 kg   SpO2 98%   BMI 29.95 kg/m  Physical Exam Vitals and nursing note reviewed.  Constitutional:      General: She is not in acute distress.    Appearance: Normal appearance. She is well-developed. She is not ill-appearing, toxic-appearing or diaphoretic.  HENT:     Head: Normocephalic and atraumatic.     Right Ear: External ear normal.     Left Ear: External ear normal.     Nose:     Comments: Oozing of blood from left nostril.  After hemostasis, no mucosal lesions identified.    Mouth/Throat:     Mouth: Mucous membranes are moist.  Eyes:     Extraocular Movements: Extraocular movements intact.     Conjunctiva/sclera: Conjunctivae normal.  Cardiovascular:     Rate and Rhythm: Normal rate and regular rhythm.  Pulmonary:     Effort: Pulmonary effort is normal. No respiratory  distress.  Abdominal:     General: There is no distension.     Palpations: Abdomen is soft.     Tenderness: There is no abdominal tenderness.  Musculoskeletal:        General: No swelling. Normal range of motion.     Cervical back: Normal range of motion and neck supple.  Skin:    General: Skin is warm and dry.     Coloration: Skin is not jaundiced or pale.  Neurological:     General: No focal deficit present.     Mental Status: She is alert and oriented to person, place, and time.  Psychiatric:        Mood and Affect: Mood normal.        Behavior: Behavior normal.     ED Results / Procedures / Treatments   Labs (all labs ordered are listed, but only abnormal results are displayed) Labs Reviewed  CBC WITH DIFFERENTIAL/PLATELET - Abnormal; Notable for the following  components:      Result Value   Hemoglobin 11.4 (*)    HCT 34.0 (*)    RDW 15.8 (*)    Platelets 118 (*)    All other components within normal limits  BASIC METABOLIC PANEL - Abnormal; Notable for the following components:   CO2 21 (*)    Glucose, Bld 133 (*)    BUN 40 (*)    Creatinine, Ser 1.46 (*)    GFR, Estimated 34 (*)    All other components within normal limits  PROTIME-INR - Abnormal; Notable for the following components:   Prothrombin Time 16.5 (*)    INR 1.3 (*)    All other components within normal limits    EKG None  Radiology No results found.  Procedures Procedures    Medications Ordered in ED Medications  oxymetazoline (AFRIN) 0.05 % nasal spray 1 spray (1 spray Each Nare Given by Other 11/05/23 0057)  amLODipine (NORVASC) tablet 10 mg (10 mg Oral Given 11/05/23 0139)  carvedilol (COREG) tablet 25 mg (25 mg Oral Given 11/05/23 0140)    ED Course/ Medical Decision Making/ A&P                                 Medical Decision Making Amount and/or Complexity of Data Reviewed Labs: ordered.  Risk OTC drugs. Prescription drug management.   Patient presenting for episode of epistaxis.  On arrival in the ED she is overall well-appearing.  She has oozing of blood from her left nostril.  Patient was able to blow out a large clot from her left nostril.  After this, there did not appear to be any active bleeding.  Afrin soaked gauze was placed.  Patient held pressure to bridge of nose.  Lab work was ordered to assess for blood loss anemia.  On reassessment, gauze was removed from nostril.  Again, she had a large clot.  After removal of this, nostril is hemostatic.  No lesions or bleeding is identified on direct inspection.  Patient remained in the ED for observation.  Lab work shows no acute drop in hemoglobin.  Creatinine is increased when compared to lab work from 2 years ago.  There is no more recent lab work for comparison.  Given absence of any other recent  symptoms other than epistaxis, I suspect chronic change.  Blood pressure was elevated tonight.  Home blood pressure medications were ordered.  On reassessment, patient had very  slight recurrence of bleeding.  Gauze was replaced in the nostril.  This was removed after 20 minutes.  Nostril was again hemostatic.  Patient was advised to not pick at her nose.  After further observation, patient has sustained hemostasis.  Blood pressure improved.  She was discharged in stable condition.        Final Clinical Impression(s) / ED Diagnoses Final diagnoses:  Epistaxis    Rx / DC Orders ED Discharge Orders     None         Gloris Manchester, MD 11/05/23 0246

## 2024-02-28 DIAGNOSIS — I5032 Chronic diastolic (congestive) heart failure: Secondary | ICD-10-CM | POA: Diagnosis not present

## 2024-02-28 DIAGNOSIS — E1122 Type 2 diabetes mellitus with diabetic chronic kidney disease: Secondary | ICD-10-CM | POA: Diagnosis not present

## 2024-02-28 DIAGNOSIS — E7849 Other hyperlipidemia: Secondary | ICD-10-CM | POA: Diagnosis not present

## 2024-02-28 DIAGNOSIS — I10 Essential (primary) hypertension: Secondary | ICD-10-CM | POA: Diagnosis not present

## 2024-02-28 DIAGNOSIS — K219 Gastro-esophageal reflux disease without esophagitis: Secondary | ICD-10-CM | POA: Diagnosis not present

## 2024-02-28 DIAGNOSIS — Z0000 Encounter for general adult medical examination without abnormal findings: Secondary | ICD-10-CM | POA: Diagnosis not present

## 2024-02-28 DIAGNOSIS — N1832 Chronic kidney disease, stage 3b: Secondary | ICD-10-CM | POA: Diagnosis not present

## 2024-06-04 DIAGNOSIS — N1832 Chronic kidney disease, stage 3b: Secondary | ICD-10-CM | POA: Diagnosis not present

## 2024-06-04 DIAGNOSIS — E1122 Type 2 diabetes mellitus with diabetic chronic kidney disease: Secondary | ICD-10-CM | POA: Diagnosis not present

## 2024-06-04 DIAGNOSIS — K219 Gastro-esophageal reflux disease without esophagitis: Secondary | ICD-10-CM | POA: Diagnosis not present

## 2024-06-04 DIAGNOSIS — I10 Essential (primary) hypertension: Secondary | ICD-10-CM | POA: Diagnosis not present

## 2024-06-04 DIAGNOSIS — I5032 Chronic diastolic (congestive) heart failure: Secondary | ICD-10-CM | POA: Diagnosis not present

## 2024-06-04 DIAGNOSIS — E7849 Other hyperlipidemia: Secondary | ICD-10-CM | POA: Diagnosis not present

## 2024-12-17 ENCOUNTER — Encounter (HOSPITAL_COMMUNITY): Payer: Self-pay

## 2024-12-17 ENCOUNTER — Emergency Department (HOSPITAL_COMMUNITY)
Admission: EM | Admit: 2024-12-17 | Discharge: 2024-12-17 | Disposition: A | Discharge status: Home / Self Care | Attending: Emergency Medicine | Admitting: Emergency Medicine

## 2024-12-17 ENCOUNTER — Other Ambulatory Visit: Payer: Self-pay

## 2024-12-17 ENCOUNTER — Emergency Department (HOSPITAL_COMMUNITY)

## 2024-12-17 DIAGNOSIS — R7989 Other specified abnormal findings of blood chemistry: Secondary | ICD-10-CM | POA: Insufficient documentation

## 2024-12-17 DIAGNOSIS — Z7901 Long term (current) use of anticoagulants: Secondary | ICD-10-CM | POA: Insufficient documentation

## 2024-12-17 DIAGNOSIS — I509 Heart failure, unspecified: Secondary | ICD-10-CM | POA: Diagnosis not present

## 2024-12-17 DIAGNOSIS — E119 Type 2 diabetes mellitus without complications: Secondary | ICD-10-CM | POA: Diagnosis not present

## 2024-12-17 DIAGNOSIS — I110 Hypertensive heart disease with heart failure: Secondary | ICD-10-CM | POA: Insufficient documentation

## 2024-12-17 DIAGNOSIS — Z79899 Other long term (current) drug therapy: Secondary | ICD-10-CM | POA: Insufficient documentation

## 2024-12-17 DIAGNOSIS — J101 Influenza due to other identified influenza virus with other respiratory manifestations: Secondary | ICD-10-CM | POA: Insufficient documentation

## 2024-12-17 DIAGNOSIS — R059 Cough, unspecified: Secondary | ICD-10-CM | POA: Diagnosis present

## 2024-12-17 LAB — CBC
HCT: 38.8 % (ref 36.0–46.0)
Hemoglobin: 12.6 g/dL (ref 12.0–15.0)
MCH: 28.3 pg (ref 26.0–34.0)
MCHC: 32.5 g/dL (ref 30.0–36.0)
MCV: 87.2 fL (ref 80.0–100.0)
Platelets: 90 K/uL — ABNORMAL LOW (ref 150–400)
RBC: 4.45 MIL/uL (ref 3.87–5.11)
RDW: 16.7 % — ABNORMAL HIGH (ref 11.5–15.5)
WBC: 4.5 K/uL (ref 4.0–10.5)
nRBC: 0 % (ref 0.0–0.2)

## 2024-12-17 LAB — RESP PANEL BY RT-PCR (RSV, FLU A&B, COVID)  RVPGX2
Influenza A by PCR: POSITIVE — AB
Influenza B by PCR: NEGATIVE
Resp Syncytial Virus by PCR: NEGATIVE
SARS Coronavirus 2 by RT PCR: NEGATIVE

## 2024-12-17 LAB — BASIC METABOLIC PANEL WITH GFR
Anion gap: 13 (ref 5–15)
BUN: 26 mg/dL — ABNORMAL HIGH (ref 8–23)
CO2: 22 mmol/L (ref 22–32)
Calcium: 9.5 mg/dL (ref 8.9–10.3)
Chloride: 106 mmol/L (ref 98–111)
Creatinine, Ser: 1.53 mg/dL — ABNORMAL HIGH (ref 0.44–1.00)
GFR, Estimated: 31 mL/min — ABNORMAL LOW
Glucose, Bld: 124 mg/dL — ABNORMAL HIGH (ref 70–99)
Potassium: 4.3 mmol/L (ref 3.5–5.1)
Sodium: 141 mmol/L (ref 135–145)

## 2024-12-17 LAB — TROPONIN T, HIGH SENSITIVITY
Troponin T High Sensitivity: 45 ng/L — ABNORMAL HIGH (ref 0–19)
Troponin T High Sensitivity: 42 ng/L — ABNORMAL HIGH (ref 0–19)

## 2024-12-17 MED ORDER — ACETAMINOPHEN 325 MG PO TABS
650.0000 mg | ORAL_TABLET | Freq: Once | ORAL | Status: AC | PRN
  Administered 2024-12-17: 650 mg via ORAL
  Filled 2024-12-17: qty 2

## 2024-12-17 MED ORDER — OXYMETAZOLINE HCL 0.05 % NA SOLN: 1.0000 | Freq: Two times a day (BID) | NASAL | 0 refills | Status: AC

## 2024-12-17 MED ORDER — OSELTAMIVIR PHOSPHATE 30 MG PO CAPS: 30.0000 mg | ORAL_CAPSULE | Freq: Every day | ORAL | 0 refills | Status: AC

## 2024-12-17 NOTE — ED Triage Notes (Signed)
 Pt presents with runny nose, sneezing, dry cough, ShOB, and chest tightness that started 2 days ago. Pt denies sick contacts. Pt has not taken any of her home medications today because she has not had an appetite.

## 2024-12-17 NOTE — ED Notes (Signed)
 Pt/family received d/c paperwork at this time. After going over the paperwork any questions, comments, or concerns were answered to the best of this nurse's knowledge. The pt/family verbally acknowledged the teachings/instructions.   D/c info reviewed with daughter & pt

## 2024-12-17 NOTE — ED Provider Notes (Addendum)
 " [place] EMERGENCY DEPARTMENT AT [facility] Provider Note  CSN: [id number] Arrival date & time: 12/17/24 1438  Chief Complaint(s) Cough and Chest Pain  HPI Rachel Copeland is a 89 y.o. female with past medical history as below, significant for CHF, DM, HLD, HTN who presents to the ED with complaint of cough, sneezing, chest tightness  Patient began feeling unwell yesterday, congestion, rhinorrhea, nonproductive cough, chest tightness when coughing.  Mildly reduced appetite but tolerant p.o. no difficulty.  No nausea or vomiting.  No change in bowel or bladder function.  No sick contacts.  No medication prior to arrival. Lives w/ son   Past Medical History Past Medical History:  Diagnosis Date   CHF (congestive heart failure) (HCC)    Diabetes mellitus    no medications   Hyperlipidemia    Hypertension    Patient Active Problem List   Diagnosis Date Noted   Acute pulmonary embolism with acute cor pulmonale (HCC) 07/21/2021   Pulmonary embolism (HCC) 07/20/2021   Localized swelling of left lower extremity 11/28/2018   Hypokalemia 11/28/2018   Bilateral pneumonia    CAP (community acquired pneumonia) 12/02/2014   HTN (hypertension) 12/02/2014   Dyslipidemia 12/02/2014   Chronic diastolic CHF (congestive heart failure) (HCC) 12/02/2014   Anemia of chronic disease 12/02/2014   Thrombocytopenia (HCC) 12/02/2014   Chronic kidney disease (CKD), stage IV (severe) (HCC) 12/02/2014   Diabetes mellitus type 2, controlled (HCC) 12/01/2014   Acute respiratory failure with hypoxia (HCC) 12/01/2014   THYROID  NODULE 08/06/2009   CATARACTS, BILATERAL 11/20/2006   CAROTID STENOSIS 11/20/2006   Home Medication(s) Prior to Admission medications  Medication Sig Start Date End Date Taking? Authorizing Provider  amLODipine  (NORVASC ) 10 MG tablet Take 10 mg by mouth daily.    [provider]  apixaban  (ELIQUIS ) 5 MG TABS tablet Take 2 tablets (10 mg total) by mouth 2  (two) times daily. For 7 days. Then 1 tablet two times daily starting 07/30/21 07/23/21   Evonnie Lenis, MD  Ascorbic Acid (VITAMIN C) 1000 MG tablet Take 1,000 mg by mouth every morning.    [provider]  benzonatate  (TESSALON ) 200 MG capsule Take 1 capsule (200 mg total) by mouth 3 (three) times daily as needed for cough. 12/23/21   Stuart Vernell Norris, PA-C  calcium -vitamin D (OSCAL WITH D) 500-200 MG-UNIT per tablet Take 1 tablet by mouth every morning.    [provider]  carvedilol  (COREG ) 25 MG tablet Take 25 mg by mouth 2 (two) times daily with a meal.    [provider]  fish oil-omega-3 fatty acids 1000 MG capsule Take 1 g by mouth every morning.    [provider]  furosemide  (LASIX ) 20 MG tablet Take 20 mg by mouth 2 (two) times a week. 12/17/21   [provider]  Multiple Vitamins-Minerals (MULTIVITAMIN ADULT PO) Take 1 tablet by mouth daily. One a day womens    [provider]  rosuvastatin  (CRESTOR ) 10 MG tablet Take 10 mg by mouth daily. 03/21/19   [provider]  Past Surgical History Past Surgical History:  Procedure Laterality Date   NO PAST SURGERIES     Family History Family History  Problem Relation Age of Onset   Colon cancer Mother    Colon polyps Mother    Diabetes Mellitus II Brother    Diabetes Mellitus II Daughter    Breast cancer Neg Hx    Stomach cancer Neg Hx     Social History Social History[1] Allergies Sitagliptin phosphate  Review of Systems A thorough review of systems was obtained and all systems are negative except as noted in the HPI and PMH.   Physical Exam Vital Signs  I have reviewed the triage vital signs BP (!) 184/82 (BP Location: Left Arm)   Pulse 84   Temp (!) 101 F (38.3 C) (Oral)   Resp 20   Ht 5' 5 (1.651 m)   Wt 77.1 kg   SpO2 94%    BMI 28.29 kg/m  Physical Exam Vitals and nursing note reviewed.  Constitutional:      General: She is not in acute distress.    Appearance: Normal appearance.  HENT:     Head: Normocephalic and atraumatic.     Right Ear: External ear normal.     Left Ear: External ear normal.     Nose: Nose normal.     Mouth/Throat:     Mouth: Mucous membranes are moist.  Eyes:     General: No scleral icterus.       Right eye: No discharge.        Left eye: No discharge.  Cardiovascular:     Rate and Rhythm: Normal rate and regular rhythm.     Pulses: Normal pulses.     Heart sounds: Normal heart sounds.  Pulmonary:     Effort: Pulmonary effort is normal. No respiratory distress.     Breath sounds: Normal breath sounds. No stridor.  Abdominal:     General: Abdomen is flat. There is no distension.     Palpations: Abdomen is soft.     Tenderness: There is no abdominal tenderness.  Musculoskeletal:     Cervical back: No rigidity.     Right lower leg: No edema.     Left lower leg: No edema.  Skin:    General: Skin is warm and dry.     Capillary Refill: Capillary refill takes less than 2 seconds.  Neurological:     Mental Status: She is alert.  Psychiatric:        Mood and Affect: Mood normal.        Behavior: Behavior normal. Behavior is cooperative.     ED Results and Treatments Labs (all labs ordered are listed, but only abnormal results are displayed) Labs Reviewed  RESP PANEL BY RT-PCR (RSV, FLU A&B, COVID)  RVPGX2 - Abnormal; Notable for the following components:      Result Value   Influenza A by PCR POSITIVE (*)    All other components within normal limits  BASIC METABOLIC PANEL WITH GFR - Abnormal; Notable for the following components:   Glucose, Bld 124 (*)    BUN 26 (*)    Creatinine, Ser 1.53 (*)    GFR, Estimated 31 (*)    All other components within normal limits  CBC - Abnormal; Notable for the following components:   RDW 16.7 (*)    Platelets 90 (*)    All  other components within normal limits  TROPONIN T, HIGH SENSITIVITY - Abnormal; Notable for the  following components:   Troponin T High Sensitivity 45 (*)    All other components within normal limits  TROPONIN T, HIGH SENSITIVITY - Abnormal; Notable for the following components:   Troponin T High Sensitivity 42 (*)    All other components within normal limits                                                                                                                          Radiology DG Chest 2 View Result Date: 12/17/2024 CLINICAL DATA:  Runny nose, sneezing, dry cough, shortness of breath and chest tightness. EXAM: CHEST - 2 VIEW COMPARISON:  July 19, 2021 FINDINGS: The cardiac silhouette is enlarged and unchanged in size. There is marked severity calcification of the aortic arch and tortuosity of the descending thoracic aorta. No acute infiltrate, pleural effusion or pneumothorax is identified. Multilevel degenerative changes are seen throughout the thoracic spine. IMPRESSION: Stable cardiomegaly without evidence of acute or active cardiopulmonary disease. Electronically Signed   By: Suzen Dials M.D.   On: 12/17/2024 16:02    Pertinent labs & imaging results that were available during my care of the patient were reviewed by me and considered in my medical decision making (see MDM for details).  Medications Ordered in ED Medications  acetaminophen  (TYLENOL ) tablet 650 mg (650 mg Oral Given 12/17/24 1533)                                                                                                                                     Procedures Procedures  (including critical care time)  Medical Decision Making / ED Course    Medical Decision Making:    Rachel Copeland is a 89 y.o. female  with past medical history as below, significant for CHF, DM, HLD, HTN who presents to the ED with complaint of cough, sneezing, chest tightness. The complaint involves an extensive  differential diagnosis and also carries with it a high risk of complications and morbidity.  Serious etiology was considered. Ddx includes but is not limited to: Influenza, pneumonia, ACS, pneumothorax, costochondritis, viral syndrome, etc.  Complete initial physical exam performed, notably the patient was in no acute distress.    Reviewed and confirmed nursing documentation for past medical history, family history, social history.  Vital signs reviewed.    Flu A> - symptoms ongoing around 24-48 hours  - No hypoxia, labs are stable. -  Trop is mildly elevated but flat, no chest pain, EKG is nonischemic - She is tolerant p.o. difficulty, able to eat a sandwich while in the ER. - XR unremarkable  Likely secondary to influenza A, she would like to try Tamiflu , discussed supportive care at home.  Strict return precautions  7:57 PM:  I have discussed the diagnosis/risks/treatment options with the patient.  Evaluation and diagnostic testing in the emergency department does not suggest an emergent condition requiring admission or immediate intervention beyond what has been performed at this time.  They will follow up with PCP. We also discussed returning to the ED immediately if new or worsening sx occur. We discussed the sx which are most concerning (e.g., sudden worsening pain, fever, inability to tolerate by mouth ) that necessitate immediate return.    The patient appears reasonably screened and/or stabilized for discharge and I doubt any other medical condition or other [facility] requiring further screening, evaluation, or treatment in the ED at this time prior to discharge.                        Additional history obtained: -Additional history obtained from na -External records from outside source obtained and reviewed including: Chart review including previous notes, labs, imaging, consultation notes including  Allergies   Lab Tests: -I ordered, reviewed, and interpreted labs.    The pertinent results include:   Labs Reviewed  RESP PANEL BY RT-PCR (RSV, FLU A&B, COVID)  RVPGX2 - Abnormal; Notable for the following components:      Result Value   Influenza A by PCR POSITIVE (*)    All other components within normal limits  BASIC METABOLIC PANEL WITH GFR - Abnormal; Notable for the following components:   Glucose, Bld 124 (*)    BUN 26 (*)    Creatinine, Ser 1.53 (*)    GFR, Estimated 31 (*)    All other components within normal limits  CBC - Abnormal; Notable for the following components:   RDW 16.7 (*)    Platelets 90 (*)    All other components within normal limits  TROPONIN T, HIGH SENSITIVITY - Abnormal; Notable for the following components:   Troponin T High Sensitivity 45 (*)    All other components within normal limits  TROPONIN T, HIGH SENSITIVITY - Abnormal; Notable for the following components:   Troponin T High Sensitivity 42 (*)    All other components within normal limits    Notable for flu positive  EKG   EKG Interpretation Date/Time:  Wednesday December 17 2024 15:26:05 EST Ventricular Rate:  82 PR Interval:  184 QRS Duration:  168 QT Interval:  438 QTC Calculation: 511 R Axis:   26  Text Interpretation: Normal sinus rhythm Left bundle branch block Abnormal ECG When compared with ECG of 19-Jul-2021 17:36, Left bundle branch block has replaced Non-specific intra-ventricular conduction block similar to prior Confirmed by Elnor Savant ([id number]) on 12/17/2024 3:29:08 PM         Imaging Studies ordered: I ordered imaging studies including cxr I independently visualized the following imaging with scope of interpretation limited to determining acute life threatening conditions related to emergency care; findings noted above I agree with the radiologist interpretation If any imaging was obtained with contrast I closely monitored patient for any possible adverse reaction a/w contrast administration in the emergency department   Medicines  ordered and prescription drug management: Meds ordered this encounter  Medications   acetaminophen  (TYLENOL ) tablet 650  mg    -I have reviewed the patients home medicines and have made adjustments as needed   Consultations Obtained: na   Cardiac Monitoring: The patient was maintained on a cardiac monitor.  I personally viewed and interpreted the cardiac monitored which showed an underlying rhythm of: nsr Continuous pulse oximetry interpreted by myself, 98% on RA.    Social Determinants of Health:  Diagnosis or treatment significantly limited by social determinants of health: na   Reevaluation: After the interventions noted above, I reevaluated the patient and found that they have improved  Co morbidities that complicate the patient evaluation  Past Medical History:  Diagnosis Date   CHF (congestive heart failure) (HCC)    Diabetes mellitus    no medications   Hyperlipidemia    Hypertension       Dispostion: Disposition decision including need for hospitalization was considered, and patient discharged from emergency department.    Final Clinical Impression(s) / ED Diagnoses Final diagnoses:  Influenza A        Elnor Jayson LABOR, DO 12/17/24 1957     [1]  Social History Tobacco Use   Smoking status: Never   Smokeless tobacco: Never  Vaping Use   Vaping status: Never Used  Substance Use Topics   Alcohol use: No   Drug use: No     Elnor Jayson LABOR, DO 12/17/24 1958  "

## 2024-12-17 NOTE — Discharge Instructions (Addendum)
 You have been seen in the Emergency Department (ED) today for a likely viral illness.  Please drink plenty of clear fluids (water, Gatorade, chicken broth, etc).  You may use Tylenol and/or Motrin according to label instructions.  You can alternate between the two without any side effects.   Please follow up with your doctor as listed above.  Call your doctor or return to the Emergency Department (ED) if you are unable to tolerate fluids due to vomiting, have worsening trouble breathing, become extremely tired or difficult to awaken, or if you develop any other symptoms that concern you.
